# Patient Record
Sex: Female | Born: 1940 | Race: White | Hispanic: No | Marital: Married | Smoking: Never smoker
Health system: Southern US, Community
[De-identification: ages and names within clinical notes are randomized; demographics above are authoritative.]

## PROBLEM LIST (undated history)

## (undated) DIAGNOSIS — H9190 Unspecified hearing loss, unspecified ear: Secondary | ICD-10-CM

## (undated) DIAGNOSIS — K219 Gastro-esophageal reflux disease without esophagitis: Secondary | ICD-10-CM

## (undated) DIAGNOSIS — K811 Chronic cholecystitis: Secondary | ICD-10-CM

## (undated) DIAGNOSIS — R002 Palpitations: Secondary | ICD-10-CM

## (undated) DIAGNOSIS — M51369 Other intervertebral disc degeneration, lumbar region without mention of lumbar back pain or lower extremity pain: Secondary | ICD-10-CM

## (undated) DIAGNOSIS — M1990 Unspecified osteoarthritis, unspecified site: Secondary | ICD-10-CM

## (undated) DIAGNOSIS — N200 Calculus of kidney: Secondary | ICD-10-CM

## (undated) DIAGNOSIS — I10 Essential (primary) hypertension: Secondary | ICD-10-CM

## (undated) DIAGNOSIS — E785 Hyperlipidemia, unspecified: Secondary | ICD-10-CM

## (undated) DIAGNOSIS — N183 Chronic kidney disease, stage 3 unspecified: Secondary | ICD-10-CM

## (undated) DIAGNOSIS — Z87442 Personal history of urinary calculi: Secondary | ICD-10-CM

## (undated) DIAGNOSIS — I739 Peripheral vascular disease, unspecified: Secondary | ICD-10-CM

## (undated) DIAGNOSIS — R739 Hyperglycemia, unspecified: Secondary | ICD-10-CM

## (undated) DIAGNOSIS — R251 Tremor, unspecified: Secondary | ICD-10-CM

## (undated) DIAGNOSIS — M5136 Other intervertebral disc degeneration, lumbar region: Secondary | ICD-10-CM

## (undated) HISTORY — DX: Essential (primary) hypertension: I10

## (undated) HISTORY — DX: Calculus of kidney: N20.0

## (undated) HISTORY — PX: WISDOM TOOTH EXTRACTION: SHX21

## (undated) HISTORY — DX: Hyperlipidemia, unspecified: E78.5

## (undated) HISTORY — PX: EXTERNAL EAR SURGERY: SHX627

---

## 1961-10-17 HISTORY — PX: APPENDECTOMY: SHX54

## 1961-10-17 HISTORY — PX: OVARY SURGERY: SHX727

## 1968-10-17 HISTORY — PX: FOOT SURGERY: SHX648

## 2004-08-25 ENCOUNTER — Ambulatory Visit: Payer: Self-pay

## 2008-10-17 HISTORY — PX: COLONOSCOPY: SHX174

## 2009-05-05 ENCOUNTER — Ambulatory Visit: Payer: Self-pay | Admitting: Gastroenterology

## 2012-04-25 ENCOUNTER — Ambulatory Visit: Payer: Self-pay | Admitting: Internal Medicine

## 2013-05-02 ENCOUNTER — Ambulatory Visit: Payer: Self-pay | Admitting: Internal Medicine

## 2013-05-07 ENCOUNTER — Encounter: Payer: Self-pay | Admitting: *Deleted

## 2013-05-07 ENCOUNTER — Ambulatory Visit: Payer: Self-pay | Admitting: Internal Medicine

## 2013-05-22 ENCOUNTER — Ambulatory Visit (INDEPENDENT_AMBULATORY_CARE_PROVIDER_SITE_OTHER): Payer: Medicare Other | Admitting: General Surgery

## 2013-05-22 ENCOUNTER — Encounter: Payer: Self-pay | Admitting: General Surgery

## 2013-05-22 VITALS — BP 140/78 | HR 70 | Resp 14 | Ht 62.0 in | Wt 186.0 lb

## 2013-05-22 DIAGNOSIS — K811 Chronic cholecystitis: Secondary | ICD-10-CM | POA: Insufficient documentation

## 2013-05-22 NOTE — Progress Notes (Signed)
Patient ID: Dawn Vega, female   DOB: 1941-04-17, 72 y.o.   MRN: [id number]  Chief Complaint  Patient presents with  . Other    gallbladder    HPI Dawn Vega is a 72 y.o. female who presents for an evaluation of the gallbladder. The patient states off and on abdominal pain for approximately 8-9 months. She notices abdominal swelling when she eats spicy foods. The pain is located in the right upper quadrant which radiates to her back. The patient has not noted a clear correlation with fatty foods course reproducing her upper bowel discomfort.  She reports the pain is in the right upper quadrant with some referral into the right back. Bloating is also appreciated. No diarrhea.   HPI  Past Medical History  Diagnosis Date  . Hyperlipidemia   . Kidney stones 2014  . Hypertension     Past Surgical History  Procedure Laterality Date  . Foot surgery Left 1970  . External ear surgery Left     1970's  . Ovary surgery  1963  . Appendectomy  1963  . Colonoscopy  2010    Dr. Bluford Kaufmann    Family History  Problem Relation Age of Onset  . Cancer Paternal Aunt     breast   . Cancer Paternal Aunt     colon    Social History History  Substance Use Topics  . Smoking status: Never Smoker   . Smokeless tobacco: Never Used  . Alcohol Use: No    Allergies  Allergen Reactions  . Tape Rash    Current Outpatient Prescriptions  Medication Sig Dispense Refill  . aspirin 81 MG tablet Take 81 mg by mouth daily.      . cholecalciferol (VITAMIN D) 400 UNITS TABS tablet Take by mouth.      . fish oil-omega-3 fatty acids 1000 MG capsule Take 2 g by mouth daily.      Marland Kitchen losartan (COZAAR) 100 MG tablet Take 1 tablet by mouth daily.      . Multiple Vitamins-Minerals (MULTIVITAMIN WITH MINERALS) tablet Take 1 tablet by mouth daily.      Marland Kitchen omeprazole (PRILOSEC) 20 MG capsule Take 1 capsule by mouth daily at 6 (six) AM.      . pravastatin (PRAVACHOL) 40 MG tablet Take 1 tablet by mouth daily.      .  vitamin E 400 UNIT capsule Take 400 Units by mouth daily.       No current facility-administered medications for this visit.    Review of Systems Review of Systems  Constitutional: Negative.   Respiratory: Negative.   Cardiovascular: Negative.   Gastrointestinal: Positive for nausea and abdominal pain.    Blood pressure 140/78, pulse 70, resp. rate 14, height 5\' 2"  (1.575 m), weight 186 lb (84.369 kg).  Physical Exam Physical Exam  Constitutional: She is oriented to person, place, and time. She appears well-developed and well-nourished.  Neck: No thyromegaly present.  Cardiovascular: Normal rate, regular rhythm, normal heart sounds and normal pulses.   No murmur heard. Pulmonary/Chest: Effort normal and breath sounds normal.  Abdominal: Soft. Normal appearance and bowel sounds are normal. There is tenderness in the right upper quadrant.  Lymphadenopathy:    She has no cervical adenopathy.  Neurological: She is alert and oriented to person, place, and time.  Skin: Skin is warm and dry.    Data Reviewed Abdominal ultrasound dated 05/02/2013 was negative.  HIDA scan dated 05/02/2013 showed normal uptake, but a depressed ejection fraction of 16%  after receiving 8 ounces of Ensure orally. The patient experienced no reproduction of her pain ring the study.  Laboratory testing dated 04/17/2013 showed normal urinalysis, normal liver function studies, normal renal function  Assessment    Chronic cholecystitis based on HIDA scan, less than 100% correlation between symptoms and imaging studies to    Plan    Without reproduction of her pain during the study, the likelihood of relief of her abdominal distress is probably in the 50-75% range of elective cholecystectomy. The location of her symptoms is certainly suggestive of biliary tract dysfunction.  At this time, the patient has not committed to the idea of surgery.  She was encouraged to keep a dietary log to see if there was any  pattern developing between meals, activities and the onset/duration of her symptoms.  She was encouraged to call should she have any questions or desire to investigate elective cholecystectomy.       Dawn Vega 05/24/2013, 5:00 PM

## 2013-05-22 NOTE — Patient Instructions (Addendum)
Patient to return as needed. She is advised of warning signs related to the Gallbladder.

## 2013-05-24 ENCOUNTER — Encounter: Payer: Self-pay | Admitting: General Surgery

## 2013-10-30 ENCOUNTER — Observation Stay: Payer: Self-pay | Admitting: Internal Medicine

## 2013-10-30 LAB — COMPREHENSIVE METABOLIC PANEL
ALBUMIN: 4.4 g/dL (ref 3.4–5.0)
ALT: 36 U/L (ref 12–78)
Alkaline Phosphatase: 84 U/L
Anion Gap: 5 — ABNORMAL LOW (ref 7–16)
BILIRUBIN TOTAL: 0.5 mg/dL (ref 0.2–1.0)
BUN: 12 mg/dL (ref 7–18)
CALCIUM: 9.5 mg/dL (ref 8.5–10.1)
CHLORIDE: 104 mmol/L (ref 98–107)
CO2: 30 mmol/L (ref 21–32)
CREATININE: 0.85 mg/dL (ref 0.60–1.30)
EGFR (African American): >60
EGFR (Non-African Amer.): >60
Glucose: 142 mg/dL — ABNORMAL HIGH (ref 65–99)
OSMOLALITY: 280 (ref 275–301)
Potassium: 4.2 mmol/L (ref 3.5–5.1)
SGOT(AST): 30 U/L (ref 15–37)
Sodium: 139 mmol/L (ref 136–145)
Total Protein: 8.3 g/dL — ABNORMAL HIGH (ref 6.4–8.2)

## 2013-10-30 LAB — URINALYSIS, COMPLETE
BACTERIA: NONE SEEN
BILIRUBIN, UR: NEGATIVE
Blood: NEGATIVE
Glucose,UR: 50 mg/dL (ref 0–75)
KETONE: NEGATIVE
LEUKOCYTE ESTERASE: NEGATIVE
Nitrite: NEGATIVE
Ph: 8 (ref 4.5–8.0)
RBC,UR: <1 /HPF (ref 0–5)
Specific Gravity: 1.012 (ref 1.003–1.030)

## 2013-10-30 LAB — CBC
HCT: 44.8 % (ref 35.0–47.0)
HGB: 14.9 g/dL (ref 12.0–16.0)
MCH: 27.7 pg (ref 26.0–34.0)
MCHC: 33.2 g/dL (ref 32.0–36.0)
MCV: 84 fL (ref 80–100)
Platelet: 230 10*3/uL (ref 150–440)
RBC: 5.37 10*6/uL — ABNORMAL HIGH (ref 3.80–5.20)
RDW: 13.2 % (ref 11.5–14.5)
WBC: 8 10*3/uL (ref 3.6–11.0)

## 2013-10-30 LAB — LIPASE, BLOOD: LIPASE: 124 U/L (ref 73–393)

## 2013-10-31 LAB — BASIC METABOLIC PANEL
Anion Gap: 6 — ABNORMAL LOW (ref 7–16)
BUN: 12 mg/dL (ref 7–18)
CHLORIDE: 105 mmol/L (ref 98–107)
Calcium, Total: 9.4 mg/dL (ref 8.5–10.1)
Co2: 27 mmol/L (ref 21–32)
Creatinine: 0.86 mg/dL (ref 0.60–1.30)
EGFR (African American): >60
GLUCOSE: 148 mg/dL — AB (ref 65–99)
Osmolality: 278 (ref 275–301)
Potassium: 3.3 mmol/L — ABNORMAL LOW (ref 3.5–5.1)
SODIUM: 138 mmol/L (ref 136–145)

## 2013-10-31 LAB — CBC WITH DIFFERENTIAL/PLATELET
Basophil #: 0 10*3/uL (ref 0.0–0.1)
Basophil %: 0.1 %
Eosinophil #: 0 10*3/uL (ref 0.0–0.7)
Eosinophil %: 0.4 %
HCT: 40.4 % (ref 35.0–47.0)
HGB: 13.4 g/dL (ref 12.0–16.0)
LYMPHS PCT: 16.8 %
Lymphocyte #: 1.9 10*3/uL (ref 1.0–3.6)
MCH: 27.3 pg (ref 26.0–34.0)
MCHC: 33 g/dL (ref 32.0–36.0)
MCV: 83 fL (ref 80–100)
Monocyte #: 0.8 x10 3/mm (ref 0.2–0.9)
Monocyte %: 7 %
NEUTROS ABS: 8.7 10*3/uL — AB (ref 1.4–6.5)
Neutrophil %: 75.7 %
PLATELETS: 211 10*3/uL (ref 150–440)
RBC: 4.9 10*6/uL (ref 3.80–5.20)
RDW: 13.1 % (ref 11.5–14.5)
WBC: 11.5 10*3/uL — ABNORMAL HIGH (ref 3.6–11.0)

## 2013-10-31 LAB — MAGNESIUM: Magnesium: 1.4 mg/dL — ABNORMAL LOW

## 2013-11-20 ENCOUNTER — Ambulatory Visit: Payer: Self-pay | Admitting: Gastroenterology

## 2014-02-26 ENCOUNTER — Ambulatory Visit: Payer: Self-pay | Admitting: Surgery

## 2014-03-04 ENCOUNTER — Ambulatory Visit: Payer: Self-pay | Admitting: Surgery

## 2014-03-05 LAB — PATHOLOGY REPORT

## 2014-04-30 DIAGNOSIS — R251 Tremor, unspecified: Secondary | ICD-10-CM | POA: Insufficient documentation

## 2014-04-30 DIAGNOSIS — H9190 Unspecified hearing loss, unspecified ear: Secondary | ICD-10-CM | POA: Insufficient documentation

## 2014-05-27 ENCOUNTER — Ambulatory Visit: Payer: Self-pay | Admitting: Internal Medicine

## 2014-08-18 ENCOUNTER — Encounter: Payer: Self-pay | Admitting: General Surgery

## 2014-11-13 DIAGNOSIS — H40053 Ocular hypertension, bilateral: Secondary | ICD-10-CM | POA: Diagnosis not present

## 2014-11-14 DIAGNOSIS — H40053 Ocular hypertension, bilateral: Secondary | ICD-10-CM | POA: Diagnosis not present

## 2014-12-05 DIAGNOSIS — I10 Essential (primary) hypertension: Secondary | ICD-10-CM | POA: Diagnosis not present

## 2014-12-05 DIAGNOSIS — M150 Primary generalized (osteo)arthritis: Secondary | ICD-10-CM | POA: Diagnosis not present

## 2014-12-05 DIAGNOSIS — K219 Gastro-esophageal reflux disease without esophagitis: Secondary | ICD-10-CM | POA: Diagnosis not present

## 2014-12-05 DIAGNOSIS — E785 Hyperlipidemia, unspecified: Secondary | ICD-10-CM | POA: Diagnosis not present

## 2015-01-19 DIAGNOSIS — D2271 Melanocytic nevi of right lower limb, including hip: Secondary | ICD-10-CM | POA: Diagnosis not present

## 2015-01-19 DIAGNOSIS — D225 Melanocytic nevi of trunk: Secondary | ICD-10-CM | POA: Diagnosis not present

## 2015-01-19 DIAGNOSIS — L821 Other seborrheic keratosis: Secondary | ICD-10-CM | POA: Diagnosis not present

## 2015-01-19 DIAGNOSIS — D2261 Melanocytic nevi of right upper limb, including shoulder: Secondary | ICD-10-CM | POA: Diagnosis not present

## 2015-02-07 NOTE — Op Note (Signed)
PATIENT NAME:  Dawn Vega, Dawn Vega MR#:  [id number] DATE OF BIRTH:  1941/05/17  DATE OF PROCEDURE:  03/04/2014  PREOPERATIVE DIAGNOSIS: Chronic cholecystitis.   POSTOPERATIVE DIAGNOSIS: Chronic cholecystitis.   PROCEDURE: Laparoscopic cholecystectomy, cholangiogram.   SURGEON: Rochel Brome, MD   ANESTHESIA: General.   INDICATIONS: This 74 year old female has a history of chronic intermittent right upper quadrant abdominal pains. She has fatty food intolerance. Ultrasound demonstrated no gallstones; however, hepatobiliary scan showed an abnormally low gallbladder ejection fraction of 16%. Diagnosis of cholecystitis was made, and surgery was recommended for definitive treatment.   DESCRIPTION OF PROCEDURE: The patient was placed on the operating table in the supine position under general endotracheal anesthesia. The abdomen was prepared with ChloraPrep and draped in a sterile manner.   A short incision was made in the inferior aspect of the umbilicus and carried down to the deep fascia, which was grasped with laryngeal hook and elevated. A Veress needle was inserted, aspirated and irrigated with a saline solution. Next, the peritoneal cavity was inflated with carbon dioxide. The Veress needle was removed. The 10 mm cannula was inserted. The 10 mm, 0 degree laparoscope was inserted to view the peritoneal cavity. Another incision was made in the epigastrium slightly to the right of midline to introduce an 11 mm cannula. Two incisions were made in the lateral aspect of the right upper quadrant to introduce two 5 mm cannulas.   With the patient in the reverse Trendelenburg position and turned several degrees to the left, the gallbladder was retracted towards the right shoulder. There were adhesions surrounding the gallbladder. The liver itself appeared normal. The multiple adhesions were taken down with blunt and sharp dissection. Several small bleeding points were cauterized. The gallbladder was retracted  further towards the right shoulder, and the infundibulum was retracted inferiorly and laterally. The porta hepatis was demonstrated. Some fatty tissue was dissected away from the neck of the gallbladder, exposing the cystic duct, which was dissected free from surrounding structures. Also, the cystic artery was dissected free from surrounding structures. The gallbladder neck was mobilized with incision of the visceral peritoneum. A critical view of safety was demonstrated. An Endo Clip was placed across the cystic duct adjacent to the neck of the gallbladder. An incision was made in the cystic duct to introduce a Reddick catheter. Half-strength Conray-60 dye was injected as the cholangiogram was done with fluoroscopy, viewing the biliary tree and flow of dye into the duodenum. The cholangiogram appeared normal. Images were saved. Next, the laparoscope was reintroduced. The cystic duct was doubly ligated with Endo Clips and divided. The cystic artery was controlled with double Endo Clips and divided. The gallbladder was dissected free from the liver with hook and cautery. Bleeding was very minimal. Hemostasis was subsequently intact. The site was irrigated with a heparinized saline solution and aspirated. The gallbladder was delivered up through the infraumbilical incision, opened and suctioned, removed and submitted for routine pathology. The right upper quadrant was further inspected, seeing hemostasis was intact. The cannulas were removed. Carbon dioxide was allowed to escape from the peritoneal cavity. Skin incisions were closed with interrupted 5-0 chromic subcuticular sutures, benzoin and Steri-Strips. Dressings were applied with paper tape. The patient tolerated surgery satisfactorily and was then moved to the recovery room for postoperative care.   ____________________________ J. Rochel Brome, MD jws:lb D: 03/04/2014 08:47:22 ET T: 03/04/2014 08:57:33 ET JOB#: [id number]  cc: Loreli Dollar, MD,  <Dictator> Loreli Dollar MD ELECTRONICALLY SIGNED 03/10/2014 9:11

## 2015-02-07 NOTE — H&P (Signed)
PATIENT NAME:  Dawn Vega, DETTMANN MR#:  [id number] DATE OF BIRTH:  Oct 14, 1941  DATE OF ADMISSION:  10/30/2013  REFERRING PHYSICIAN: Dr. Jasmine December.   PRIMARY CARE PHYSICIAN: Dr. Ramonita Lab.   CHIEF COMPLAINT: Left flank pain.   A 74 year old Caucasian  female with past medical history of hyperlipidemia, gastroesophageal reflux disease, nephrolithiasis, presenting with left flank pain. She describes acute onset of sharp, left flank pain, nonradiating, 8 out of 10 in intensity. No worsening or relieving factors, associated with nausea and vomiting x 3 of nonbloody, nonbilious emesis. She has been intolerant of p.o. ever since; this has been a total of about 8 hours. She denies any fevers, chills  or recent sick contacts. In the Emergency Department, she received multiple doses of morphine, still has minimal symptoms, though markedly improved. She was going to be discharged; however, on ambulation, she felt her symptoms return and she felt dizzy.   ER staff felt that she required admission. Currently, she is without complaint other than feeling generalized weakness and fatigue.   REVIEW OF SYSTEMS:  CONSTITUTIONAL:  Positive for weakness, fatigue. Denies fevers or chills.  EYES: Denies blurred vision, double vision, eye pain.  EARS, NOSE, THROAT: Denies tinnitus, ear pain or hearing loss.  RESPIRATORY: Denies cough, wheezing, shortness of breath.  CARDIOVASCULAR: Denies chest pain, palpitations, edema.  GASTROINTESTINAL: Positive for nausea, vomiting and abdominal pain as described above. Denies any diarrhea or hematemesis or constipation.  GENITOURINARY: Denies dysuria or hematuria.  ENDOCRINE: Denies nocturia or thyroid problems.  HEMATOLOGIC AND LYMPHATIC: Denies easy bruising or bleeding.  SKIN: Denies rash or lesion.  MUSCULOSKELETAL: Denies pain in neck, back, shoulder, knees, hips, arthritic symptoms.  NEUROLOGIC: Denies paralysis, paresthesias.  PSYCHIATRIC:  Denies anxiety or depressive  symptoms. Otherwise, full review of systems performed by me is negative.   PAST MEDICAL HISTORY: Hyperlipidemia, hypertension, gastroesophageal reflux disease, as well as nephrolithiasis.   SOCIAL HISTORY: Denies any alcohol, tobacco or drug usage.   FAMILY HISTORY: Positive for lung cancer, though no known kidney problems. It does say that there is some irritable bowel, possibly Crohn disease that runs in the family, although she is uncertain of this.   ALLERGIES: No known drug allergies.   HOME MEDICATIONS: Include losartan 25 mg p.o. daily, Prilosec 20 mg p.o. daily, Percocet 5/325 mg p.o. every 6 hours as needed for pain, pravastatin 40 mg p.o. daily.   PHYSICAL EXAMINATION: VITAL SIGNS: Temperature 98.6, heart rate 92, respirations 18, blood pressure 168/68, saturating 95% on room air. Weight 81.6 kg, BMI 33.  GENERAL: Well-nourished, well-developed Caucasian female who is currently in no acute distress.  HEAD: Normocephalic, atraumatic.  EYES: Pupils equal, round and reactive to light. Extraocular muscles intact. No scleral icterus.  MOUTH: Dry mucosal membranes. Dentition intact. No abscess noted.   EARS, NOSE, THROAT:  Clear without exudates. No external lesions.  NECK: Supple. No thyromegaly. No nodules. No JVD.  PULMONARY: Clear to auscultation bilaterally. No wheezes or rhonchi. No use of accessory muscles. Good respiratory effort.  CHEST: Nontender to palpation.  CARDIOVASCULAR: S1, S2, regular rate and rhythm. No murmurs, rubs or gallops. No edema. Pedal pulses 2+ bilaterally.  GASTROINTESTINAL: Soft, nontender, nondistended. No masses. Positive bowel sounds. No hepatosplenomegaly. No CVA tenderness. No spinal or paraspinal tenderness.  NEUROLOGIC: Cranial nerves II through XII intact. No gross focal neurological deficits. Sensation and reflexes intact.  SKIN: No ulcerations, lesions, rash or cyanosis. Skin warm and dry. Turgor is intact.  PSYCHIATRIC:  Mood and affect are  within normal limits. The patient is awake, alert and oriented x 3. Insight and judgment intact.   LABORATORY DATA: Sodium 139, potassium 4.3, chloride 104, bicarb 30, BUN 12, creatinine 0.85, glucose 142. Total protein 8.3; remainder of LFTs within normal limits. WBC of 8, hemoglobin 14.9, platelets of 230. Urinalysis: Negative for evidence of infection, also negative for RBCs. Lactic acid of 1.3. CT abdomen performed revealing two 2 mm kidney stones on the right; however, no findings on the left, but there is a mild fold thickening in the terminal ileum; suspect a degree of terminal ileitis. Per the radiologist, major differential considerations include infectious or early Crohn's; however, there is no evidence of mesenteric thickening or fistulas. Remainder of bowel is within normal limits.   ASSESSMENT AND PLAN: A 74 year old Caucasian female with history of hyperlipidemia and gastroesophageal reflux disease as well as kidney stones, presented with:  1.  Left flank pain along with abdominal pain of unclear etiology. CT abdomen reveals terminal ileitis. There is some probable terminal ileitis. She has received antibiotics in the Emergency Department and Zofran. We will provide supportive care including pain control, Zofran, IV fluids. She has not eaten  recently. She has no risk factors for ischemic colitis. No change in bowel habits, including bright red blood per rectum; however, if her pain persists, worsens, or she develops further symptoms, we will move this higher in the differential, and she will need further workup.  2.  Hyperglycemia. We will add insulin sliding scale. She has no known diagnosis of diabetes. With q.6 hour Accu-Cheks.  3.  Gastroesophageal reflux disease, pantoprazole.  4.  Hypertension. Continue with losartan.  5.  Venous thromboembolism prophylaxis with sequential compression devices.   The patient is FULL CODE.   TIME SPENT: 45 minutes.     ____________________________ Aaron Mose. Kenny Rea, MD dkh:dmm D: 10/30/2013 21:45:35 ET T: 10/30/2013 22:08:54 ET JOB#: [id number]  cc: Aaron Mose. Candace Begue, MD, <Dictator> Hayzel Ruberg Woodfin Ganja MD ELECTRONICALLY SIGNED 10/31/2013 0:30

## 2015-03-17 DIAGNOSIS — J069 Acute upper respiratory infection, unspecified: Secondary | ICD-10-CM | POA: Diagnosis not present

## 2015-05-13 DIAGNOSIS — H40053 Ocular hypertension, bilateral: Secondary | ICD-10-CM | POA: Diagnosis not present

## 2015-07-17 ENCOUNTER — Other Ambulatory Visit: Payer: Self-pay | Admitting: Internal Medicine

## 2015-07-17 DIAGNOSIS — K219 Gastro-esophageal reflux disease without esophagitis: Secondary | ICD-10-CM | POA: Diagnosis not present

## 2015-07-17 DIAGNOSIS — E784 Other hyperlipidemia: Secondary | ICD-10-CM | POA: Diagnosis not present

## 2015-07-17 DIAGNOSIS — I10 Essential (primary) hypertension: Secondary | ICD-10-CM | POA: Diagnosis not present

## 2015-07-17 DIAGNOSIS — Z1231 Encounter for screening mammogram for malignant neoplasm of breast: Secondary | ICD-10-CM

## 2015-07-17 DIAGNOSIS — R251 Tremor, unspecified: Secondary | ICD-10-CM | POA: Diagnosis not present

## 2015-07-17 DIAGNOSIS — Z23 Encounter for immunization: Secondary | ICD-10-CM | POA: Diagnosis not present

## 2015-07-17 DIAGNOSIS — M150 Primary generalized (osteo)arthritis: Secondary | ICD-10-CM | POA: Diagnosis not present

## 2015-07-17 DIAGNOSIS — Z1239 Encounter for other screening for malignant neoplasm of breast: Secondary | ICD-10-CM | POA: Diagnosis not present

## 2015-07-17 DIAGNOSIS — E785 Hyperlipidemia, unspecified: Secondary | ICD-10-CM | POA: Diagnosis not present

## 2015-07-28 ENCOUNTER — Ambulatory Visit
Admission: RE | Admit: 2015-07-28 | Discharge: 2015-07-28 | Disposition: A | Discharge status: Home / Self Care | Payer: Commercial Managed Care - HMO | Source: Ambulatory Visit | Attending: Internal Medicine | Admitting: Internal Medicine

## 2015-07-28 DIAGNOSIS — Z1231 Encounter for screening mammogram for malignant neoplasm of breast: Secondary | ICD-10-CM | POA: Insufficient documentation

## 2015-09-15 DIAGNOSIS — E784 Other hyperlipidemia: Secondary | ICD-10-CM | POA: Diagnosis not present

## 2015-09-15 DIAGNOSIS — I10 Essential (primary) hypertension: Secondary | ICD-10-CM | POA: Diagnosis not present

## 2015-09-15 DIAGNOSIS — Z7982 Long term (current) use of aspirin: Secondary | ICD-10-CM | POA: Diagnosis not present

## 2015-09-15 DIAGNOSIS — K219 Gastro-esophageal reflux disease without esophagitis: Secondary | ICD-10-CM | POA: Diagnosis not present

## 2015-09-15 DIAGNOSIS — R270 Ataxia, unspecified: Secondary | ICD-10-CM | POA: Diagnosis not present

## 2015-09-15 DIAGNOSIS — M150 Primary generalized (osteo)arthritis: Secondary | ICD-10-CM | POA: Diagnosis not present

## 2015-09-15 DIAGNOSIS — R251 Tremor, unspecified: Secondary | ICD-10-CM | POA: Diagnosis not present

## 2015-10-13 DIAGNOSIS — S63501A Unspecified sprain of right wrist, initial encounter: Secondary | ICD-10-CM | POA: Diagnosis not present

## 2015-10-13 DIAGNOSIS — M25531 Pain in right wrist: Secondary | ICD-10-CM | POA: Diagnosis not present

## 2015-11-09 DIAGNOSIS — H40053 Ocular hypertension, bilateral: Secondary | ICD-10-CM | POA: Diagnosis not present

## 2015-11-17 DIAGNOSIS — H40053 Ocular hypertension, bilateral: Secondary | ICD-10-CM | POA: Diagnosis not present

## 2015-12-15 DIAGNOSIS — K219 Gastro-esophageal reflux disease without esophagitis: Secondary | ICD-10-CM | POA: Insufficient documentation

## 2015-12-15 DIAGNOSIS — I10 Essential (primary) hypertension: Secondary | ICD-10-CM | POA: Insufficient documentation

## 2015-12-15 DIAGNOSIS — M159 Polyosteoarthritis, unspecified: Secondary | ICD-10-CM | POA: Insufficient documentation

## 2015-12-15 DIAGNOSIS — R739 Hyperglycemia, unspecified: Secondary | ICD-10-CM | POA: Diagnosis not present

## 2015-12-15 DIAGNOSIS — M150 Primary generalized (osteo)arthritis: Secondary | ICD-10-CM | POA: Diagnosis not present

## 2015-12-15 DIAGNOSIS — R251 Tremor, unspecified: Secondary | ICD-10-CM | POA: Diagnosis not present

## 2015-12-15 DIAGNOSIS — E784 Other hyperlipidemia: Secondary | ICD-10-CM | POA: Diagnosis not present

## 2015-12-15 DIAGNOSIS — N3941 Urge incontinence: Secondary | ICD-10-CM | POA: Diagnosis not present

## 2015-12-15 DIAGNOSIS — Z7982 Long term (current) use of aspirin: Secondary | ICD-10-CM | POA: Diagnosis not present

## 2015-12-15 DIAGNOSIS — M79644 Pain in right finger(s): Secondary | ICD-10-CM | POA: Diagnosis not present

## 2016-01-05 DIAGNOSIS — N393 Stress incontinence (female) (male): Secondary | ICD-10-CM | POA: Diagnosis not present

## 2016-01-18 ENCOUNTER — Ambulatory Visit: Payer: Commercial Managed Care - HMO | Attending: Obstetrics and Gynecology | Admitting: Physical Therapy

## 2016-01-18 ENCOUNTER — Encounter: Payer: Self-pay | Admitting: Physical Therapy

## 2016-01-18 DIAGNOSIS — M629 Disorder of muscle, unspecified: Secondary | ICD-10-CM | POA: Diagnosis not present

## 2016-01-18 DIAGNOSIS — R279 Unspecified lack of coordination: Secondary | ICD-10-CM | POA: Insufficient documentation

## 2016-01-18 DIAGNOSIS — N8184 Pelvic muscle wasting: Secondary | ICD-10-CM | POA: Diagnosis not present

## 2016-01-18 DIAGNOSIS — M791 Myalgia: Secondary | ICD-10-CM | POA: Diagnosis not present

## 2016-01-18 DIAGNOSIS — L905 Scar conditions and fibrosis of skin: Secondary | ICD-10-CM | POA: Insufficient documentation

## 2016-01-18 DIAGNOSIS — M79604 Pain in right leg: Secondary | ICD-10-CM | POA: Diagnosis not present

## 2016-01-18 DIAGNOSIS — M6289 Other specified disorders of muscle: Secondary | ICD-10-CM

## 2016-01-18 DIAGNOSIS — M62838 Other muscle spasm: Secondary | ICD-10-CM | POA: Diagnosis not present

## 2016-01-18 NOTE — Patient Instructions (Signed)
You are now ready to begin training the deep core muscles system: diaphragm, transverse abdominis, pelvic floor . These muscles must work together as a team.           The key to these exercises to train the brain to coordinate the timing of these muscles and to have them turn on for long periods of time to hold you upright against gravity (especially important if you are on your feet all day).These muscles are postural muscles and play a role stabilizing your spine and bodyweight. By doing these repetitions slowly and correctly instead of doing crunches, you will achieve a flatter belly without a lower pooch. You are also placing your spine in a more neutral position and breathing properly which in turn, decreases your risk for problems related to your pelvic floor, abdominal, and low back such as pelvic organ prolapse, hernias, diastasis recti (separation of superficial muscles), disk herniations, spinal fractures. These exercises set a solid foundation for you to later progress to resistance/ strength training with therabands and weights and return to other typical fitness exercises with a stronger deeper core.  DO only LEVEL 1    CROSS OVER AND PIRIFORMIS (FIGURE 4 STRETCH )  5 breaths / 3 x day and pre/post walking

## 2016-01-19 NOTE — Therapy (Addendum)
[place] MAIN [facility] SERVICES [address] [place], [place], [address] Phone: [phone]   Fax:  [phone]  Physical Therapy Evaluation  Patient Details  Name: Tiani Benvenuti MRN: [id number] Date of Birth: April 21, 1941 Referring Provider: Willeen Cass  Encounter Date: 01/18/2016      PT End of Session - 01/18/16 1105    Visit Number 1   Number of Visits 12   Date for PT Re-Evaluation April 23, 2016   Authorization Type G-codes    PT Start Time    0810   PT End Time 0900   Activity Tolerance Patient tolerated treatment well;No increased pain   Behavior During Therapy [facility] for tasks assessed/performed      Past Medical History  Diagnosis Date  . Hyperlipidemia   . Kidney stones 2014  . Hypertension     Past Surgical History  Procedure Laterality Date  . Foot surgery Left 1970  . External ear surgery Left     1970's  . Ovary surgery  1963    due to cyst rupture  . Appendectomy  1963  . Colonoscopy  2010    Dr. Candace Cruise    There were no vitals filed for this visit.  Visit Diagnosis:  Pelvic floor dysfunction  Scar adherent  Fascial defect  Muscle spasm      Subjective Assessment - 01/18/16 1102    Subjective 1) SUI: Pt reports urinary leakage with sneezing, laughing, and running after granddaughter started 4 years ago. Sx has worsened to the point where she does not feel comfortable going without a pad. Changes 1 pad per day. Pt notices leakage with lifting 20 lb of water, Incomplete emptying with voiding in the last two three days. 2a )  low back mm spasm: with vaccuuming, raking, twisting,  4/10 discomfort, non radiating.  2b) R radiating LBP down lateral side to pinky toes occurs during sleep and in the monring and that wears off with movement. 2c) L > R knee pain that is constant. Arthritis related.Causes limping occasionally. 3/10 pain  3) L neck/ shoulder  pain after lifting WC for sister. 6-7/10 at its worst. Pt performs neck  stretches which is now under control.  Pt has avoided lifting.        Pertinent History 1 vaginal delivery with stitches, Practicing kegels sporadically. Cares for her sister who uses a WC.    Patient Stated Goals stop leakage and avoid wearing Depends    Currently in Pain? --   Pain Location --   Pain Orientation --   Pain Type --            [facility] PT Assessment - 01/18/16 1104    Assessment   Medical Diagnosis SUI   Referring Provider Bealsey   Precautions   Precautions None   Restrictions   Weight Bearing Restrictions No   Balance Screen   Has the patient fallen in the past 6 months No   Prior Function   Level of Independence Independent   Observation/Other Assessments   Other Surveys  --  PFDI 52%    Coordination   Gross Motor Movements are Fluid and Coordinated --  chest breathing , limited pelvic floor movement with cue    Posture/Postural Control   Posture Comments lumbopelvic instability w/ hip flexion   Palpation   Spinal mobility limited midback mobility   SI assessment  L PSIS tenderness w/ tensions at S3-4 level lateral aspect pf sacrum, L ASIS more anterior    Palpation  comment lower quadrant abdominal scar restrictions                  Pelvic Floor Special Questions - 01/18/16 1105    Pelvic Floor Internal Exam Pt verbally consented without contraindications   Exam Type Vaginal   Palpation increased mm tensions without tenderness to palpation (3 layers bilaterally)   Strength Flicker   Biofeedback cued for proper diaphragmatic breathing to elicit pelvic floor lengthening           [facility] Adult PT Treatment/Exercise - 01/25/16 1105    Posture/Postural Control   Posture Comments lumbopelvic instability w/ hip flexion   Self-Care   Self-Care --  POC, anatomy, physiology, goals    Neuro Re-ed    Neuro Re-ed Details  breathing coordination with pelvic floor lengthening    Manual Therapy   Manual therapy comments AP mob Grade II supine,  inferior glide of sacrum, to adjust PSIS, STM along lateral aspect of L sacru,                      PT Long Term Goals - 01/18/16 [id number]    PT LONG TERM GOAL #1   Title Pt will be able to increase her PSFS score for sleeping without interruption of radiating leg pain from 6/10 to  10/10 in order to improve sleep quality.    Time 1   Period Weeks   Status New   PT LONG TERM GOAL #2   Title Pt will increase her PSFS score with walking from 4/10 io > 8/10 in order to maintain health and wellness.    Time 12   Period Weeks   Status New   PT LONG TERM GOAL #3   Title Pt will be able to vaccuum and sweeping from 2/10 on PSFS t > 5/10 in order to perform ADLs.    Time 12   Period Weeks   Status New   PT LONG TERM GOAL #4   Title Pt will demo 3/5/5/5 pelvic floor strengthn in order to minimize SUI.    Time 12   Period Weeks   Status New   PT LONG TERM GOAL #5   Title Pt will report being able to go without a urinary pad for 3 days out of 7 days in order to save money and show improved urinary Sx.     Time 12   Period Weeks   Status New               Plan - 01/25/16 1108    Clinical Impression Statement P is a 75 yo female whose complaints include SUI, difficulty with complete voiding, R radiating low back pain, arthritis-related bilateral knee pain, and L neck/shoulder pain. Pt's clinicial presentations include pelvic floor weakness/coordination, pelvic girdle asymmetries, mm tightness along her spine, abdominal scar restrictions, and poor core strength which may be contributing factors to her pelvic floor dysfunction, LBP, LE, neck/shoulder complaints. These deficits limit her ability to care for sister, sleep without interruptions, and impact her QOL.    Rehab Potential Good   PT Frequency 1x / week   PT Duration 12 weeks   PT Treatment/Interventions ADLs/Self Care Home Management;Aquatic Therapy;Cryotherapy;Biofeedback;Electrical Stimulation;Moist  Heat;Traction;Balance training;Therapeutic activities;Therapeutic exercise;Functional mobility training;Stair training;Gait training;Neuromuscular re-education;Manual techniques;Manual lymph drainage;Taping;Splinting;Energy conservation;Dry needling;Passive range of motion;Scar mobilization;Other (comment)  body mechanics training   Consulted and Agree with Plan of Care Patient         Problem List Patient Active Problem  List   Diagnosis Date Noted  . Chronic cholecystitis 05/22/2013    [name] ,PT, DPT, E-RYT  01/18/2016, 11:20 AM  [place] MAIN [facility] SERVICES [address] [place], [place], [address] Phone: [phone]   Fax:  [phone]  Name: Krosby Bring MRN: [id number] Date of Birth: [birth year]

## 2016-01-25 ENCOUNTER — Emergency Department: Payer: Commercial Managed Care - HMO

## 2016-01-25 ENCOUNTER — Emergency Department
Admission: EM | Admit: 2016-01-25 | Discharge: 2016-01-25 | Disposition: A | Discharge status: Home / Self Care | Payer: Commercial Managed Care - HMO | Attending: Emergency Medicine | Admitting: Emergency Medicine

## 2016-01-25 ENCOUNTER — Ambulatory Visit: Payer: Commercial Managed Care - HMO | Admitting: Physical Therapy

## 2016-01-25 ENCOUNTER — Telehealth: Payer: Self-pay | Admitting: Physical Therapy

## 2016-01-25 DIAGNOSIS — M62838 Other muscle spasm: Secondary | ICD-10-CM

## 2016-01-25 DIAGNOSIS — Z79899 Other long term (current) drug therapy: Secondary | ICD-10-CM | POA: Insufficient documentation

## 2016-01-25 DIAGNOSIS — M79671 Pain in right foot: Secondary | ICD-10-CM | POA: Diagnosis not present

## 2016-01-25 DIAGNOSIS — I10 Essential (primary) hypertension: Secondary | ICD-10-CM | POA: Insufficient documentation

## 2016-01-25 DIAGNOSIS — M7989 Other specified soft tissue disorders: Secondary | ICD-10-CM

## 2016-01-25 DIAGNOSIS — M79661 Pain in right lower leg: Secondary | ICD-10-CM | POA: Diagnosis not present

## 2016-01-25 DIAGNOSIS — R2241 Localized swelling, mass and lump, right lower limb: Secondary | ICD-10-CM | POA: Diagnosis not present

## 2016-01-25 DIAGNOSIS — E785 Hyperlipidemia, unspecified: Secondary | ICD-10-CM | POA: Diagnosis not present

## 2016-01-25 DIAGNOSIS — Z7982 Long term (current) use of aspirin: Secondary | ICD-10-CM | POA: Insufficient documentation

## 2016-01-25 DIAGNOSIS — M79604 Pain in right leg: Secondary | ICD-10-CM

## 2016-01-25 DIAGNOSIS — L905 Scar conditions and fibrosis of skin: Secondary | ICD-10-CM

## 2016-01-25 DIAGNOSIS — M629 Disorder of muscle, unspecified: Secondary | ICD-10-CM

## 2016-01-25 DIAGNOSIS — M6289 Other specified disorders of muscle: Secondary | ICD-10-CM

## 2016-01-25 MED ORDER — IBUPROFEN 600 MG PO TABS: 600.0000 mg | ORAL_TABLET | Freq: Once | ORAL | Status: DC

## 2016-01-25 MED ORDER — IBUPROFEN 600 MG PO TABS
600.0000 mg | ORAL_TABLET | Freq: Once | ORAL | Status: AC
  Administered 2016-01-25: 600 mg via ORAL
  Filled 2016-01-25: qty 1

## 2016-01-25 NOTE — Telephone Encounter (Signed)
PT called Dr. Olin Pia office and informed RN re: pt's unrelenting R foot pain with weightbearing for the past 5 days, pt was brought to ED and d/c with neg results for DVT. PT asked if pt can be worked into their schedule and pt was scheduled for a 2:30pm appointment this afternoon.

## 2016-01-25 NOTE — Therapy (Signed)
[place] MAIN [facility] SERVICES [address] [place], [place], [address] Phone: [phone]   Fax:  [phone]  Patient Details  Name: Dawn Vega MRN: [id number] Date of Birth: 13-Feb-1941 Referring Provider:  Benjaman Kindler, MD  Encounter Date: 01/25/2016  Pt reported she had R dorsum/lateral foot pain that radiated up to her knee. This Sx started five days ago after being on her feet shopping and driving a 3 hr commute. Pain was 8/10 with weight-bearing. Accompanied with numbness and swelling. Swelling had gone down. Pain remained 8/10 today with weight -bearing upon arrival to PT Session.    PT assessment showed no weakness nor sensory deficits. PT deferred PT session today.  Pt was escorted in a WC to ED by PT and pt was taken back to the clinic upon arrival.       [name] ,PT, DPT, E-RYT  01/25/2016, 8:45 AM  [place], [place], [address] Phone: [phone]   Fax:  [phone]

## 2016-01-25 NOTE — Addendum Note (Signed)
Addended by: [name] on: 01/25/2016 11:23 AM   Modules accepted: Orders

## 2016-01-25 NOTE — ED Provider Notes (Signed)
[facility] Emergency Department Provider Note  ____________________________________________  Time seen: Approximately 9:17 AM  I have reviewed the triage vital signs and the nursing notes.   HISTORY  Chief Complaint Leg Pain and Foot Pain    HPI Dawn Vega is a 75 y.o. female without any personal or family history of blood clots presenting with right lower extremity swelling and pain. The patient reports that 6 days ago she was on her feet for large portion of the day, and then she noted that her right lower extremity was swollen and that she was having some lateral lower leg and right foot discomfort. Her swelling has significantly improved since then, and her pain is improved with Tylenol and low-dose Motrin. She has not been having any chest pain, shortness of breath, lightheadedness or syncope. No fever. No trauma.  Past Medical History  Diagnosis Date  . Hyperlipidemia   . Kidney stones 2014  . Hypertension     Patient Active Problem List   Diagnosis Date Noted  . Chronic cholecystitis 05/22/2013    Past Surgical History  Procedure Laterality Date  . Foot surgery Left 1970  . External ear surgery Left     1970's  . Ovary surgery  1963    due to cyst rupture  . Appendectomy  1963  . Colonoscopy  2010    Dr. Candace Cruise    Current Outpatient Rx  Name  Route  Sig  Dispense  Refill  . aspirin 81 MG tablet   Oral   Take 81 mg by mouth daily.         . cholecalciferol (VITAMIN D) 400 UNITS TABS tablet   Oral   Take by mouth.         . cromolyn (NASALCROM) 5.2 MG/ACT nasal spray   Each Nare   Place 1 spray into both nostrils 4 (four) times daily.         Marland Kitchen dextromethorphan-guaiFENesin (MUCINEX DM) 30-600 MG 12hr tablet   Oral   Take 1 tablet by mouth 2 (two) times daily.         . fish oil-omega-3 fatty acids 1000 MG capsule   Oral   Take 2 g by mouth daily.         Marland Kitchen ibuprofen (ADVIL,MOTRIN) 600 MG tablet   Oral   Take  1 tablet (600 mg total) by mouth once. With food   15 tablet   0   . losartan (COZAAR) 100 MG tablet   Oral   Take 1 tablet by mouth daily.         . metoprolol succinate (TOPROL-XL) 50 MG 24 hr tablet   Oral   Take 50 mg by mouth daily. Take with or immediately following a meal.         . Multiple Vitamins-Minerals (MULTIVITAMIN WITH MINERALS) tablet   Oral   Take 1 tablet by mouth daily.         Marland Kitchen omeprazole (PRILOSEC) 20 MG capsule   Oral   Take 1 capsule by mouth daily at 6 (six) AM.         . pravastatin (PRAVACHOL) 40 MG tablet   Oral   Take 1 tablet by mouth daily.         . vitamin E 400 UNIT capsule   Oral   Take 400 Units by mouth daily.           Allergies Tape  Family History  Problem Relation Age of Onset  .  Cancer Paternal Aunt     breast   . Breast cancer Paternal Aunt   . Cancer Paternal Aunt     colon    Social History Social History  Substance Use Topics  . Smoking status: Never Smoker   . Smokeless tobacco: Never Used  . Alcohol Use: No    Review of Systems Constitutional: No fever/chills.No lightheadedness or syncope. Eyes: No visual changes. ENT: No sore throat. No congestion or rhinorrhea. Cardiovascular: Denies chest pain. Denies palpitations. Respiratory: Denies shortness of breath.  No cough. Gastrointestinal: No abdominal pain.  No nausea, no vomiting.  No diarrhea.  No constipation. Genitourinary: Negative for dysuria. Musculoskeletal: Negative for back pain. Positive right large semi-swelling and pain. Skin: Negative for rash. Neurological: Negative for headaches. No focal numbness, tingling or weakness.   10-point ROS otherwise negative.  ____________________________________________   PHYSICAL EXAM:  VITAL SIGNS: ED Triage Vitals  Enc Vitals Group     BP 01/25/16 0833 194/105 mmHg     Pulse Rate 01/25/16 0833 70     Resp 01/25/16 0833 18     Temp 01/25/16 0833 97.6 F (36.4 C)     Temp Source  01/25/16 0833 Oral     SpO2 01/25/16 0833 98 %     Weight 01/25/16 0833 182 lb (82.555 kg)     Height 01/25/16 0833 5\' 2"  (1.575 m)     Head Cir --      Peak Flow --      Pain Score --      Pain Loc --      Pain Edu? --      Excl. in [place]? --     Constitutional: Alert and oriented. Well appearing and in no acute distress. Answers questions appropriately. Eyes: Conjunctivae are normal.  EOMI. No scleral icterus. Head: Atraumatic. Nose: No congestion/rhinnorhea. Mouth/Throat: Mucous membranes are moist.  Neck: No stridor.  Supple.  No JVD. Cardiovascular: Normal rate.  Respiratory: Normal respiratory effort.   Musculoskeletal: No obvious LE edema. No ttp in the calves or palpable cords.  Negative Homan's sign. I'm unable to reproduce the patient's discomfort in the right lateral lower leg and foot. Normal DP and PT pulses bilaterally. Cap refill less than 2 seconds in the feet bilaterally area did normal sensation to light touch throughout the bilateral lower extremities. Neurologic:  A&Ox3.  Speech is clear.  Face and smile are symmetric.  EOMI.  Moves all extremities well. Skin:  Skin is warm, dry and intact. No rash noted. Psychiatric: Mood and affect are normal. Speech and behavior are normal.  Normal judgement. ____________________________________________   LABS (all labs ordered are listed, but only abnormal results are displayed)  Labs Reviewed - No data to display ____________________________________________  EKG  Not indicated ____________________________________________  RADIOLOGY  US Venous Img Lower Unilateral Right  01/25/2016  CLINICAL DATA:  Right lower extremity swelling and pain for 5 days. EXAM: RIGHT LOWER EXTREMITY VENOUS DOPPLER ULTRASOUND TECHNIQUE: Gray-scale sonography with graded compression, as well as color Doppler and duplex ultrasound were performed to evaluate the lower extremity deep venous systems from the level of the common femoral vein and  including the common femoral, femoral, profunda femoral, popliteal and calf veins including the posterior tibial, peroneal and gastrocnemius veins when visible. The superficial great saphenous vein was also interrogated. Spectral Doppler was utilized to evaluate flow at rest and with distal augmentation maneuvers in the common femoral, femoral and popliteal veins. COMPARISON:  None. FINDINGS: Contralateral Common Femoral Vein:  Respiratory phasicity is normal and symmetric with the symptomatic side. No evidence of thrombus. Normal compressibility. Common Femoral Vein: No evidence of thrombus. Normal compressibility, respiratory phasicity and response to augmentation. Saphenofemoral Junction: No evidence of thrombus. Normal compressibility and flow on color Doppler imaging. Profunda Femoral Vein: No evidence of thrombus. Normal compressibility and flow on color Doppler imaging. Femoral Vein: No evidence of thrombus. Normal compressibility, respiratory phasicity and response to augmentation. Popliteal Vein: No evidence of thrombus. Normal compressibility, respiratory phasicity and response to augmentation. Calf Veins: No evidence of thrombus. Normal compressibility and flow on color Doppler imaging. Superficial Great Saphenous Vein: No evidence of thrombus. Normal compressibility and flow on color Doppler imaging. Venous Reflux:  None. Other Findings:  None. IMPRESSION: No evidence of deep venous thrombosis. Electronically Signed   By: Logan Bores M.D.   On: 01/25/2016 10:19    ____________________________________________   PROCEDURES  Procedure(s) performed: None  Critical Care performed: No ____________________________________________   INITIAL IMPRESSION / ASSESSMENT AND PLAN / ED COURSE  Pertinent labs & imaging results that were available during my care of the patient were reviewed by me and considered in my medical decision making (see chart for details).  75 y.o. female presenting with  improving right lower extremity swelling and pain. I will evaluate the patient for DVT. I would also consider venous insufficiency given her age. It is also possible that the patient had some minor trauma that she did not appreciate, but I do not think that she has any broken bones given that she is able to ambulate and bear full weight without pain.  ----------------------------------------- 10:27 AM on 01/25/2016 -----------------------------------------  The patient's ultrasound is negative for DVT. I'll plan to discharge her home with some medic treatment and close PMD follow-up. She understands return precautions as well as follow-up instructions.  ____________________________________________  FINAL CLINICAL IMPRESSION(S) / ED DIAGNOSES  Final diagnoses:  Pain and swelling of lower extremity, right      NEW MEDICATIONS STARTED DURING THIS VISIT:  New Prescriptions   IBUPROFEN (ADVIL,MOTRIN) 600 MG TABLET    Take 1 tablet (600 mg total) by mouth once. With food     Eula Listen, MD 01/25/16 1027

## 2016-01-25 NOTE — Discharge Instructions (Signed)
You may use Tylenol and Motrin for your pain. For swelling, please keep your leg elevated as much as possible. Please make a follow-up appointment with your primary care physician.  Return to the emergency department if you develop severe pain, chest pain, shortness of breath, lightheadedness or fainting, or any other symptoms concerning to you. Musculoskeletal Pain Musculoskeletal pain is muscle and boney aches and pains. These pains can occur in any part of the body. Your caregiver may treat you without knowing the cause of the pain. They may treat you if blood or urine tests, X-rays, and other tests were normal.  CAUSES There is often not a definite cause or reason for these pains. These pains may be caused by a type of germ (virus). The discomfort may also come from overuse. Overuse includes working out too hard when your body is not fit. Boney aches also come from weather changes. Bone is sensitive to atmospheric pressure changes. HOME CARE INSTRUCTIONS   Ask when your test results will be ready. Make sure you get your test results.  Only take over-the-counter or prescription medicines for pain, discomfort, or fever as directed by your caregiver. If you were given medications for your condition, do not drive, operate machinery or power tools, or sign legal documents for 24 hours. Do not drink alcohol. Do not take sleeping pills or other medications that may interfere with treatment.  Continue all activities unless the activities cause more pain. When the pain lessens, slowly resume normal activities. Gradually increase the intensity and duration of the activities or exercise.  During periods of severe pain, bed rest may be helpful. Lay or sit in any position that is comfortable.  Putting ice on the injured area.  Put ice in a bag.  Place a towel between your skin and the bag.  Leave the ice on for 15 to 20 minutes, 3 to 4 times a day.  Follow up with your caregiver for continued  problems and no reason can be found for the pain. If the pain becomes worse or does not go away, it may be necessary to repeat tests or do additional testing. Your caregiver may need to look further for a possible cause. SEEK IMMEDIATE MEDICAL CARE IF:  You have pain that is getting worse and is not relieved by medications.  You develop chest pain that is associated with shortness or breath, sweating, feeling sick to your stomach (nauseous), or throw up (vomit).  Your pain becomes localized to the abdomen.  You develop any new symptoms that seem different or that concern you. MAKE SURE YOU:   Understand these instructions.  Will watch your condition.  Will get help right away if you are not doing well or get worse.   This information is not intended to replace advice given to you by your health care provider. Make sure you discuss any questions you have with your health care provider.   Document Released: 10/03/2005 Document Revised: 12/26/2011 Document Reviewed: 06/07/2013 Elsevier Interactive Patient Education [employer].

## 2016-01-25 NOTE — ED Notes (Signed)
Pt sent here from PT with c/o right LE swelling and pain since last Wednesday.. Concerned for possible DVT

## 2016-01-27 DIAGNOSIS — M79671 Pain in right foot: Secondary | ICD-10-CM | POA: Diagnosis not present

## 2016-01-27 DIAGNOSIS — M7751 Other enthesopathy of right foot: Secondary | ICD-10-CM | POA: Diagnosis not present

## 2016-01-27 DIAGNOSIS — S93601A Unspecified sprain of right foot, initial encounter: Secondary | ICD-10-CM | POA: Diagnosis not present

## 2016-02-01 ENCOUNTER — Ambulatory Visit: Payer: Commercial Managed Care - HMO | Admitting: Physical Therapy

## 2016-02-01 DIAGNOSIS — R279 Unspecified lack of coordination: Secondary | ICD-10-CM

## 2016-02-01 DIAGNOSIS — M629 Disorder of muscle, unspecified: Secondary | ICD-10-CM | POA: Diagnosis not present

## 2016-02-01 DIAGNOSIS — M62838 Other muscle spasm: Secondary | ICD-10-CM | POA: Diagnosis not present

## 2016-02-01 DIAGNOSIS — M79604 Pain in right leg: Secondary | ICD-10-CM

## 2016-02-01 DIAGNOSIS — M791 Myalgia, unspecified site: Secondary | ICD-10-CM

## 2016-02-01 DIAGNOSIS — L905 Scar conditions and fibrosis of skin: Secondary | ICD-10-CM | POA: Diagnosis not present

## 2016-02-01 DIAGNOSIS — N8184 Pelvic muscle wasting: Secondary | ICD-10-CM | POA: Diagnosis not present

## 2016-02-02 NOTE — Patient Instructions (Addendum)
Deep core level 2 30 reps 2x day   Body mechanics training for household chores (handout)

## 2016-02-02 NOTE — Therapy (Signed)
[place] MAIN [facility] SERVICES [address] [place], [place], [address] Phone: [phone]   Fax:  [phone]  Physical Therapy Treatment  Patient Details  Name: Dawn Vega MRN: [id number] Date of Birth: 1940/11/29 Referring Provider: Willeen Cass  Encounter Date: 02/01/2016      PT End of Session - 02/02/16 2332    Visit Number 2   Number of Visits 12   Date for PT Re-Evaluation May 07, 2016   Authorization Type G-codes    PT Start Time 1410   PT Stop Time 1505   PT Time Calculation (min) 55 min   Activity Tolerance Patient tolerated treatment well;No increased pain   Behavior During Therapy [facility] for tasks assessed/performed      Past Medical History  Diagnosis Date  . Hyperlipidemia   . Kidney stones 2014  . Hypertension     Past Surgical History  Procedure Laterality Date  . Foot surgery Left 1970  . External ear surgery Left     1970's  . Ovary surgery  1963    due to cyst rupture  . Appendectomy  1963  . Colonoscopy  2010    Dr. Candace Cruise    There were no vitals filed for this visit.      Subjective Assessment - 02/02/16 2341    Subjective Pt has not had R radiating LBP for the past 2 weeks since the first visit. Pt reported her PCP diagnosed her R foot pain as capsulitis of the second toe and prescribed her a boot to decrease weight bearing on the forefoot.    Pertinent History 1 vaginal delivery with stitches, Practicing kegels sporadically. Cares for her sister who uses a WC.    Patient Stated Goals stop leakage and avoid wearing Depends             [facility] PT Assessment - 02/02/16 2329    Posture/Postural Control   Posture Comments improved lumbopelvic stability with Level 2                  Pelvic Floor Special Questions - 02/02/16 2328    Pelvic Floor Internal Exam Pt verbally consented without contraindications   Exam Type Vaginal   Palpation decreased mm tensions   Biofeedback improved pelvic  floor lengthening            [facility] Adult PT Treatment/Exercise - 02/02/16 2329    Therapeutic Activites    Therapeutic Activities --  body mechanics training sweeping, vaccuuming, cleaning   Neuro Re-ed    Neuro Re-ed Details  see pt instructions                PT Education - 02/02/16 2332    Education provided Yes   Education Details HEP   Person(s) Educated Patient   Methods Explanation;Demonstration;Tactile cues;Verbal cues;Handout   Comprehension Returned demonstration;Verbalized understanding             PT Long Term Goals - 02/02/16 2337    PT LONG TERM GOAL #1   Title Pt will be able to increase her PSFS score for sleeping without interruption of radiating leg pain from 6/10 to  10/10 in order to improve sleep quality.    Time 1   Period Weeks   Status Partially Met   PT LONG TERM GOAL #2   Title Pt will increase her PSFS score with walking from 4/10 io > 8/10 in order to maintain health and wellness.    Time 12   Period  Weeks   Status On-going   PT LONG TERM GOAL #3   Title Pt will be able to vaccuum and sweeping from 2/10 on PSFS t > 5/10 in order to perform ADLs.    Time 12   Period Weeks   Status Partially Met   PT LONG TERM GOAL #4   Title Pt will demo no lumbopelvic instability with 5 reps of level 1-4 and proper pelvic floor ROM  in order to decrease leakage with coughing, sneezing, laughing.   Time 12   Period Weeks   Status Revised   PT LONG TERM GOAL #5   Title Pt will report being able to go without a urinary pad for 3 days out of 7 days in order to save money and show improved urinary Sx.     Time 12   Period Weeks   Status On-going               Plan - 02/02/16 2333    Clinical Impression Statement Pt has not had any R radiating back pain since her 1 st visit. Pt has progressed with her deep core exercises with proper technique and showed no more pelvic floor mm tensions today. Pt also demo'd proper body mechanics with  household cleaning activities to minimize relapse of back pain.  Pt will continue to benefit from skilled PT to address her remaining goals. Pt has been wearing a boot due to foot injury that pt incurred 2 weeks ago. Once pt's weight-bearing restrictions are cleared by her PCP, plan to assess R foot pain and any potential gait deviations from wearing of boot to minimize malalignment of lower kinetic chain (foot to pelvis and spine) and to create additional goals related to this new problem. A new re-cert includes this additional Dx for Tx.    Rehab Potential Good   PT Frequency 1x / week   PT Duration 12 weeks   PT Treatment/Interventions ADLs/Self Care Home Management;Aquatic Therapy;Cryotherapy;Biofeedback;Electrical Stimulation;Moist Heat;Traction;Balance training;Therapeutic activities;Therapeutic exercise;Functional mobility training;Stair training;Gait training;Neuromuscular re-education;Manual techniques;Manual lymph drainage;Taping;Splinting;Energy conservation;Dry needling;Passive range of motion;Scar mobilization;Other (comment)  body mechanics training   Consulted and Agree with Plan of Care Patient      Patient will benefit from skilled therapeutic intervention in order to improve the following deficits and impairments:  Abnormal gait, Decreased endurance, Hypomobility, Decreased scar mobility, Decreased balance, Decreased activity tolerance, Increased fascial restricitons, Pain, Decreased range of motion, Decreased coordination, Decreased safety awareness, Impaired flexibility, Postural dysfunction, Improper body mechanics, Difficulty walking, Increased muscle spasms, Decreased mobility  Visit Diagnosis: Myalgia  Unspecified lack of coordination     Problem List Patient Active Problem List   Diagnosis Date Noted  . Chronic cholecystitis 05/22/2013    [name] ,PT, DPT, E-RYT  02/02/2016, 11:41 PM  [place] [facility]  SERVICES [address] [place], [place], [address] Phone: [phone]   Fax:  [phone]  Name: Dawn Vega MRN: [id number] Date of Birth: 06/25/1941

## 2016-02-10 DIAGNOSIS — M79671 Pain in right foot: Secondary | ICD-10-CM | POA: Diagnosis not present

## 2016-02-10 DIAGNOSIS — M659 Synovitis and tenosynovitis, unspecified: Secondary | ICD-10-CM | POA: Diagnosis not present

## 2016-02-12 ENCOUNTER — Encounter: Payer: Commercial Managed Care - HMO | Admitting: Physical Therapy

## 2016-02-16 ENCOUNTER — Encounter: Payer: Commercial Managed Care - HMO | Admitting: Physical Therapy

## 2016-02-22 ENCOUNTER — Ambulatory Visit: Payer: Commercial Managed Care - HMO | Attending: Obstetrics and Gynecology | Admitting: Physical Therapy

## 2016-02-22 DIAGNOSIS — M791 Myalgia, unspecified site: Secondary | ICD-10-CM

## 2016-02-22 DIAGNOSIS — M79604 Pain in right leg: Secondary | ICD-10-CM | POA: Insufficient documentation

## 2016-02-22 DIAGNOSIS — R279 Unspecified lack of coordination: Secondary | ICD-10-CM | POA: Diagnosis not present

## 2016-02-22 NOTE — Therapy (Signed)
[place] MAIN [facility] SERVICES [address] [place], [place], [address] Phone: [phone]   Fax:  [phone]  Physical Therapy Treatment  Patient Details  Name: Dawn Vega MRN: [id number] Date of Birth: 1941-03-15 Referring Provider: Willeen Cass  Encounter Date: 02/22/2016      PT End of Session - 02/22/16 December 16, 2217    Visit Number 3   Number of Visits 12   Date for PT Re-Evaluation 2016-04-30   Authorization Type G-codes    PT Start Time 0804   PT Stop Time 0900   PT Time Calculation (min) 56 min   Activity Tolerance Patient tolerated treatment well;No increased pain   Behavior During Therapy [facility] for tasks assessed/performed      Past Medical History  Diagnosis Date  . Hyperlipidemia   . Kidney stones 12-16-2012  . Hypertension     Past Surgical History  Procedure Laterality Date  . Foot surgery Left 1970  . External ear surgery Left     1970's  . Ovary surgery  1963    due to cyst rupture  . Appendectomy  16-Dec-1961  . Colonoscopy  16-Dec-2008    Dr. Candace Cruise    There were no vitals filed for this visit.      Subjective Assessment - 02/22/16 0807    Subjective Pt has returned after 3 weeks and continues to not have any LBP pain.  Pt states she experiences R foot pain 9/10  (top of R foot and leg) without  Ibuprofen (1200 mg) x 3/ day  for the past 2 weeks . She no longer wears a boot and will be seeing her podiatrist in 2 days.    Pertinent History 1 vaginal delivery with stitches, Practicing kegels sporadically. Cares for her sister who uses a WC.    Patient Stated Goals stop leakage and avoid wearing Depends             [facility] PT Assessment - 02/22/16 0835    Observation/Other Assessments   Observations posterior COM / weightbearing on heel    Coordination   Gross Motor Movements are Fluid and Coordinated --  rquired cues Level 2  for proper timing to decr. pelvic rock   Strength   Overall Strength Comments R eversion. inversion 4/5, L  5/5.  PF with UE support on wall, 3/5 R, 5/5 L                   Pelvic Floor Special Questions - 02/22/16 0834    Pelvic Floor Internal Exam Pt verbally consented without contraindications   Exam Type Vaginal   Palpation able to perform full pelvic floor excursion and life w/ cuig for optimal breathing coordination   Strength fair squeeze, definite lift           [facility] Adult PT Treatment/Exercise - 02/22/16 0903    Neuro Re-ed    Neuro Re-ed Details  gait training (see pt instructions) and posture alignment   deep core level 2 and 3 cuing to decrease lumbopelvic instab                PT Education - 02/22/16 Dec 16, 2217    Education provided Yes   Education Details HEP   Person(s) Educated Patient   Methods Explanation;Demonstration;Tactile cues;Verbal cues;Handout   Comprehension Returned demonstration;Verbalized understanding             PT Long Term Goals - 02/22/16 12/16/2222    PT LONG TERM GOAL #1  Title Pt will be able to increase her PSFS score for sleeping without interruption of radiating leg pain from 6/10 to  10/10 in order to improve sleep quality.    Time 1   Period Weeks   Status Achieved   PT LONG TERM GOAL #2   Title Pt will increase her PSFS score with walking from 4/10 io > 8/10 in order to maintain health and wellness. (5/8: prior to foot injury 6 weeks ago, 6/10 and currently, still limited by foot injury)     Time 12   Period Weeks   Status Partially Met   PT LONG TERM GOAL #3   Title Pt will be able to vaccuum and sweeping from 2/10 on PSFS t > 5/10 in order to perform ADLs. (5/8: 5/10)    Time 12   Period Weeks   Status Achieved   PT LONG TERM GOAL #4   Title Pt will demo no lumbopelvic instability with 5 reps of level 1-4 and proper pelvic floor ROM  in order to decrease leakage with coughing, sneezing, laughing.   Time 12   Period Weeks   Status Partially Met   PT LONG TERM GOAL #5   Title Pt will report being able to go without  a urinary pad for 3 days out of 7 days in order to save money and show improved urinary Sx.     Time 12   Period Weeks   Status Partially Met   PT LONG TERM GOAL #6   Title Pt will report 1/10 R foot pain with for > 30 min of standing    Time 12   Period Weeks   Status New   PT LONG TERM GOAL #7   Title Pt will report she is able manage R foot pain with exercises across 2 weeks  and relies less on Ibuprofen in order return to walking and standing.    Time 12   Period Weeks   Status New               Plan - 02/22/16 0850    Clinical Impression Statement Pt reported a decreased pain from 3/10 pain to 1.5/10 pain  in R foot pain after using gait training techniques and postureal alignment. Pt showed no more pelvic floor mm tensions. Pt was able to demonstrate pelvic floor movement in a coordinated way with other deep core mm and progressed with deep core mm exercises.  Pt was educated on letting her MD know about the high amounts of Ibuprofen she was taking (3657m/ day for the past 2 weeks) and pt voiced understanding and will be seeing her MD in two days to do so. Pt will continue to benefit from skilled PT.    Rehab Potential Good   PT Frequency 1x / week   PT Duration 12 weeks   PT Treatment/Interventions ADLs/Self Care Home Management;Aquatic Therapy;Cryotherapy;Biofeedback;Electrical Stimulation;Moist Heat;Traction;Balance training;Therapeutic activities;Therapeutic exercise;Functional mobility training;Stair training;Gait training;Neuromuscular re-education;Manual techniques;Manual lymph drainage;Taping;Splinting;Energy conservation;Dry needling;Passive range of motion;Scar mobilization;Other (comment)  body mechanics training   Consulted and Agree with Plan of Care Patient      Patient will benefit from skilled therapeutic intervention in order to improve the following deficits and impairments:  Abnormal gait, Decreased endurance, Hypomobility, Decreased scar mobility,  Decreased balance, Decreased activity tolerance, Increased fascial restricitons, Pain, Decreased range of motion, Decreased coordination, Decreased safety awareness, Impaired flexibility, Postural dysfunction, Improper body mechanics, Difficulty walking, Increased muscle spasms, Decreased mobility  Visit Diagnosis: Myalgia  Unspecified  lack of coordination  Pain In Right Leg     Problem List Patient Active Problem List   Diagnosis Date Noted  . Chronic cholecystitis 05/22/2013    [name] ,PT, DPT, E-RYT  02/22/2016, 10:25 PM  [place] MAIN [facility] SERVICES [address] [place], [place], [address] Phone: [phone]   Fax:  [phone]  Name: Dawn Vega MRN: [id number] Date of Birth: Jan 11, 1941

## 2016-02-22 NOTE — Patient Instructions (Signed)
Level 2 (knee out )  Level 3 ( lifting foot)  30 reps x in morning and night   Continue with stretches   Walking:  Keep center of mass (place behind belly button) over shin bones as you step forward ( to put more weight on fore/mid foot) less heel  Standing: unlock the knees and lift foot arch like Effiel Tower.  Sometimes alternate with semi tandem stance (foot in front of other , hip width apart)

## 2016-02-24 DIAGNOSIS — M659 Synovitis and tenosynovitis, unspecified: Secondary | ICD-10-CM | POA: Diagnosis not present

## 2016-02-29 ENCOUNTER — Encounter: Payer: Commercial Managed Care - HMO | Admitting: Physical Therapy

## 2016-03-07 ENCOUNTER — Ambulatory Visit: Payer: Commercial Managed Care - HMO | Admitting: Physical Therapy

## 2016-03-07 ENCOUNTER — Encounter: Payer: Commercial Managed Care - HMO | Admitting: Physical Therapy

## 2016-03-07 DIAGNOSIS — M791 Myalgia, unspecified site: Secondary | ICD-10-CM

## 2016-03-07 DIAGNOSIS — M79604 Pain in right leg: Secondary | ICD-10-CM | POA: Diagnosis not present

## 2016-03-07 DIAGNOSIS — R279 Unspecified lack of coordination: Secondary | ICD-10-CM | POA: Diagnosis not present

## 2016-03-07 NOTE — Therapy (Addendum)
[place] MAIN [facility] SERVICES [address] [place], [place], [address] Phone: [phone]   Fax:  [phone]  Physical Therapy Treatment / Discharge Summary   Patient Details  Name: Dawn Vega MRN: [id number] Date of Birth: 14-Dec-1940 Referring Provider: Willeen Cass  Encounter Date: 03/07/2016      PT End of Session - 03/07/16 1325    Visit Number 4   Number of Visits 12   Date for PT Re-Evaluation 2016-04-30   Authorization Type G-codes    PT Start Time 0810   PT Stop Time 0905   PT Time Calculation (min) 55 min   Activity Tolerance Patient tolerated treatment well;No increased pain   Behavior During Therapy [facility] for tasks assessed/performed      Past Medical History  Diagnosis Date  . Hyperlipidemia   . Kidney stones 2014  . Hypertension     Past Surgical History  Procedure Laterality Date  . Foot surgery Left 1970  . External ear surgery Left     1970's  . Ovary surgery  1963    due to cyst rupture  . Appendectomy  1963  . Colonoscopy  2010    Dr. Candace Cruise    There were no vitals filed for this visit.      Subjective Assessment - 03/07/16 0812    Subjective Pt returns after 2 weeks and continues to have no LBP. Pt 's R foot pain ihas improved but experiences 3/10 pain at the end fo the day. Pt has decreased her intake of Ibuprofen to every 3rd day. In terms of urinary leakage, pt had noticed leakage spots had decreased prior to her cold/cough. However,  she has noticed her urinary leakage worsened when she had her cold/ cough.      Pertinent History 1 vaginal delivery with stitches, Practicing kegels sporadically. Cares for her sister who uses a WC.    Patient Stated Goals stop leakage and avoid wearing Depends             [facility] PT Assessment - 03/07/16 1321    Strength   Overall Strength Comments heel raises 3/5 without ankle supination 10 reps B ,  higher high raises caused poor ankle instability   Ambulation/Gait    Gait Pattern --  heel striker,posterior COM,decreased trunk rotation/ hip ext                     [facility] Adult PT Treatment/Exercise - 03/07/16 1321    Therapeutic Activites    Therapeutic Activities --  gait training see pt instructions   Neuro Re-ed    Neuro Re-ed Details  see pt instructions                     PT Long Term Goals - 03/07/16 0816    PT LONG TERM GOAL #1   Title Pt will be able to increase her PSFS score for sleeping without interruption of radiating leg pain from 6/10 to  10/10 in order to improve sleep quality.    Time 1   Period Weeks   Status Achieved   PT LONG TERM GOAL #2   Title Pt will increase her PSFS score with walking from 4/10 io > 8/10 in order to maintain health and wellness. (5/8: prior to foot injury 6 weeks ago, 6/10 and currently, still limited by foot injury, 03/07/16: 7/10)     Time 12   Period Weeks   Status Partially Met  PT LONG TERM GOAL #3   Title Pt will be able to vaccuum and sweeping from 2/10 on PSFS t > 5/10 in order to perform ADLs. (5/8: 5/10)    Time 12   Period Weeks   Status Achieved   PT LONG TERM GOAL #4   Title Pt will demo no lumbopelvic instability with 5 reps of level 1-4 and proper pelvic floor ROM  in order to decrease leakage with coughing, sneezing, laughing.   Time 12   Period Weeks   Status Partially Met   PT LONG TERM GOAL #5   Title Pt will report being able to notice dry urinary pads for 3 days out of 7 days in order to save money and show improved urinary Sx.     Time 12   Period Weeks   Status Achieved   PT LONG TERM GOAL #6   Title Pt will report 1/10 R foot pain with for > 30 min of standing (03/07/16: 20 min)    Time 12   Period Weeks   Status Partially Met   PT LONG TERM GOAL #7   Title Pt will report she is able manage R foot pain with exercises across 2 weeks  and rely less on Ibuprofen in order return to walking and standing.    Time 12   Period Weeks   Status  On-going               Plan - 03/07/16 1327    Clinical Impression Statement Your pt has met 50% of her goals and partially met her remaining goals. Pt reported resolved LBP, signficantly less urinary leakage (prior to coming down with cough.cold) with dryer urinary pads for more than 3 days out of the week. Pt also noticed her stomach has become flatter with the exercises. Pt showed less mm tensions, increased deep core coordination/ strength, and pelvic floor coordination. Pt was educated on utilizing the deep core exercises to increase pelvic floor strength and not to perform regular Kegels due to pt's previous overactive pelvic floor mm and hip /back mm tightness. Pt was educated on coordinating quick contractions with emphasis on lengthening pelvic floor when coughing, sneezing, and laughing to minimize SUI. Pt was also educated on gait mechanics and feet strengthening exercises to continue decreasing her current level of feet pain (2/10). Pt demo'd techniques properly and voiced understanding. Pt is self-discharging from PT at this time due to increased expenses with medical bills and unable to continue with further PT. Pt was educated on how to advance her HEP to minimize relapse of her Sx and to progress towards wellness.  Thank you for your referral! Pt has been compliant and a pleasure to work with!      Rehab Potential Good   PT Frequency 1x / week   PT Duration 12 weeks   PT Treatment/Interventions ADLs/Self Care Home Management;Aquatic Therapy;Cryotherapy;Biofeedback;Electrical Stimulation;Moist Heat;Traction;Balance training;Therapeutic activities;Therapeutic exercise;Functional mobility training;Stair training;Gait training;Neuromuscular re-education;Manual techniques;Manual lymph drainage;Taping;Splinting;Energy conservation;Dry needling;Passive range of motion;Scar mobilization;Other (comment)  body mechanics training   Consulted and Agree with Plan of Care Patient      Patient  will benefit from skilled therapeutic intervention in order to improve the following deficits and impairments:  Abnormal gait, Decreased endurance, Hypomobility, Decreased scar mobility, Decreased balance, Decreased activity tolerance, Increased fascial restricitons, Pain, Decreased range of motion, Decreased coordination, Decreased safety awareness, Impaired flexibility, Postural dysfunction, Improper body mechanics, Difficulty walking, Increased muscle spasms, Decreased mobility  Visit Diagnosis: Myalgia  Unspecified  lack of coordination  Pain In Right Leg       G-Codes - 04/03/16 1326    Functional Assessment Tool Used PSFS clinical judgement and subjective   Functional Limitation Mobility: Walking and moving around;Self care   Mobility: Walking and Moving Around Current Status [phone]) At least 20 percent but less than 40 percent impaired, limited or restricted   Mobility: Walking and Moving Around Goal Status [phone]) At least 1 percent but less than 20 percent impaired, limited or restricted   Mobility: Walking and Moving Around Discharge Status [phone]) At least 1 percent but less than 20 percent impaired, limited or restricted   Self Care Current Status ([id number]) At least 40 percent but less than 60 percent impaired, limited or restricted   Self Care Goal Status ([id number]) At least 20 percent but less than 40 percent impaired, limited or restricted   Self Care Discharge Status [phone]) At least 20 percent but less than 40 percent impaired, limited or restricted      Problem List Patient Active Problem List   Diagnosis Date Noted  . Chronic cholecystitis 05/22/2013    [name] ,PT, DPT, E-RYT  04-03-16, 1:38 PM  [place] MAIN [facility] SERVICES [address] [place], [place], [address] Phone: [phone]   Fax:  [phone]  Name: Willadene Mounsey MRN: [id number] Date of Birth: Jun 07, 1941

## 2016-03-07 NOTE — Patient Instructions (Signed)
Heel raises 10 x  3 x day To build up your foot strength in arch  Picking up towel with feet/toes (while coffee is brewing or before putting on socks  To stretch feet    Walking :  Feet wider, swing arms for trunk rotation, and remember to breathe    ___________   Deep core exercises:  Progress with level 3 for 2 weeks, next 2 weeks, lift foot higher In a month, see if you are ready for level 4   __________   Pelvic floor coordination : in hale, lengthen pelvic floor, exhale lift pelvic floor and coordinate with cough  Every day: seated or laying down 10 x

## 2016-03-16 ENCOUNTER — Encounter: Payer: Commercial Managed Care - HMO | Admitting: Physical Therapy

## 2016-03-25 DIAGNOSIS — T63481A Toxic effect of venom of other arthropod, accidental (unintentional), initial encounter: Secondary | ICD-10-CM | POA: Diagnosis not present

## 2016-04-29 DIAGNOSIS — R21 Rash and other nonspecific skin eruption: Secondary | ICD-10-CM | POA: Diagnosis not present

## 2016-05-13 DIAGNOSIS — H2513 Age-related nuclear cataract, bilateral: Secondary | ICD-10-CM | POA: Diagnosis not present

## 2016-07-18 DIAGNOSIS — M179 Osteoarthritis of knee, unspecified: Secondary | ICD-10-CM | POA: Diagnosis not present

## 2016-07-18 DIAGNOSIS — Z23 Encounter for immunization: Secondary | ICD-10-CM | POA: Diagnosis not present

## 2016-07-18 DIAGNOSIS — K219 Gastro-esophageal reflux disease without esophagitis: Secondary | ICD-10-CM | POA: Diagnosis not present

## 2016-07-18 DIAGNOSIS — R739 Hyperglycemia, unspecified: Secondary | ICD-10-CM | POA: Diagnosis not present

## 2016-07-18 DIAGNOSIS — E784 Other hyperlipidemia: Secondary | ICD-10-CM | POA: Diagnosis not present

## 2016-07-18 DIAGNOSIS — M25561 Pain in right knee: Secondary | ICD-10-CM | POA: Diagnosis not present

## 2016-07-18 DIAGNOSIS — M150 Primary generalized (osteo)arthritis: Secondary | ICD-10-CM | POA: Diagnosis not present

## 2016-07-18 DIAGNOSIS — G8929 Other chronic pain: Secondary | ICD-10-CM | POA: Diagnosis not present

## 2016-07-18 DIAGNOSIS — I10 Essential (primary) hypertension: Secondary | ICD-10-CM | POA: Diagnosis not present

## 2016-07-18 DIAGNOSIS — M25562 Pain in left knee: Secondary | ICD-10-CM | POA: Diagnosis not present

## 2016-07-18 DIAGNOSIS — Z0000 Encounter for general adult medical examination without abnormal findings: Secondary | ICD-10-CM | POA: Diagnosis not present

## 2016-07-25 DIAGNOSIS — L298 Other pruritus: Secondary | ICD-10-CM | POA: Diagnosis not present

## 2016-07-25 DIAGNOSIS — D225 Melanocytic nevi of trunk: Secondary | ICD-10-CM | POA: Diagnosis not present

## 2016-07-25 DIAGNOSIS — L538 Other specified erythematous conditions: Secondary | ICD-10-CM | POA: Diagnosis not present

## 2016-07-25 DIAGNOSIS — D485 Neoplasm of uncertain behavior of skin: Secondary | ICD-10-CM | POA: Diagnosis not present

## 2016-07-25 DIAGNOSIS — L820 Inflamed seborrheic keratosis: Secondary | ICD-10-CM | POA: Diagnosis not present

## 2016-07-28 DIAGNOSIS — M5416 Radiculopathy, lumbar region: Secondary | ICD-10-CM | POA: Diagnosis not present

## 2016-07-28 DIAGNOSIS — M1711 Unilateral primary osteoarthritis, right knee: Secondary | ICD-10-CM | POA: Diagnosis not present

## 2016-07-28 DIAGNOSIS — M47816 Spondylosis without myelopathy or radiculopathy, lumbar region: Secondary | ICD-10-CM | POA: Diagnosis not present

## 2016-07-28 DIAGNOSIS — M5136 Other intervertebral disc degeneration, lumbar region: Secondary | ICD-10-CM | POA: Diagnosis not present

## 2016-07-29 ENCOUNTER — Other Ambulatory Visit: Payer: Self-pay | Admitting: Internal Medicine

## 2016-07-29 DIAGNOSIS — Z1231 Encounter for screening mammogram for malignant neoplasm of breast: Secondary | ICD-10-CM

## 2016-08-24 ENCOUNTER — Ambulatory Visit: Payer: Commercial Managed Care - HMO | Attending: Orthopedic Surgery | Admitting: Physical Therapy

## 2016-08-24 ENCOUNTER — Encounter: Payer: Self-pay | Admitting: Physical Therapy

## 2016-08-24 DIAGNOSIS — M79671 Pain in right foot: Secondary | ICD-10-CM | POA: Diagnosis not present

## 2016-08-24 DIAGNOSIS — M79661 Pain in right lower leg: Secondary | ICD-10-CM | POA: Diagnosis not present

## 2016-08-24 NOTE — Therapy (Signed)
[facility] [address], [place], [address] Phone: [phone]   Fax:  [phone]  Physical Therapy Evaluation  Patient Details  Name: Dawn Vega MRN: [id number] Date of Birth: 1941/02/27 Referring Provider: Feliberto Gottron, [place]  Encounter Date: 08/24/2016      PT End of Session - 08/24/16 1531    Visit Number 1   Number of Visits 13   Date for PT Re-Evaluation 09/21/16   Authorization Type 1/10   PT Start Time 1345   PT Stop Time 1500   PT Time Calculation (min) 75 min   Activity Tolerance Patient tolerated treatment well   Behavior During Therapy [facility] for tasks assessed/performed      Past Medical History:  Diagnosis Date  . Hyperlipidemia   . Hypertension   . Kidney stones 2014    Past Surgical History:  Procedure Laterality Date  . APPENDECTOMY  1963  . COLONOSCOPY  2010   Dr. Candace Cruise  . EXTERNAL EAR SURGERY Left    1970's  . FOOT SURGERY Left 1970  . [place]   due to cyst rupture    There were no vitals filed for this visit.       Subjective Assessment - 08/24/16 1521    Subjective R foot and lateral leg pain   Pertinent History Pt reports that the top of her R foot and up to her R knee is painful which started back in April 2017 with insidious onset. She states she had switched into wearing sandals (after wearing more supportive closed toe shoes) and had done a lot of walking in [place]; she reports later that night she had pain across the top of her foot. She initially attributed her pain to the change in shoes, but her pain has not changed since. She was put in a walking boot for 5 weeks but she states it did not really help. She has not been in the boot since June 2017. She states that if she walks with trunk flexion (like over a shopping cart) she does not have foot pain. She also states that if she leans back her foot pain goes away. She reports her foot  pain is worse at the day goes on. She has been doing some exercises (demonstrated DKTC and FABER stretch) that she found on the internet and she reports that she thinks it has helped some. She denies LBP currently but reports being stiff in the mornings. She does report injuring her low back about 20 years ago but denies any LBP in the last 10 years. She has never had this foot pain before. She reports a deep ache in the top of her foot and a deep burn along the lateral part of her lower leg. She reports intermittent issues with walking but reports standing still "kills me." She denies issues with stairs and curbs. She reports intermittent tingling, but can't recall what she was doing or when it occurs. She reports getting in the car especially hurts her leg. She reports after long bouts of driving her RLE feels more tired. Prior to performing her exercises, the pain would wake her up 3-4x/night but since starting her own exercises, she doesn't have issues with sleeping. She states the pain gets up to 7/10 at worse, 0/10 at best. It is currently 2/10. This pain keeps her from being more active and she enjoys shopping and reading.   Limitations Standing;Walking   How long  can you stand comfortably? pain occurs almost immediately; will eventually have to sit after about 15-20 mins   How long can you walk comfortably? intermittent issues with walking   Patient Stated Goals be able to shop and walk around without foot pain   Currently in Pain? Yes   Pain Score 2    Pain Location Foot   Pain Orientation Right   Pain Descriptors / Indicators Aching;Burning   Pain Type Chronic pain   Pain Onset More than a month ago   Aggravating Factors  a lot of activity; standing   Pain Relieving Factors rest            [facility] PT Assessment - 08/24/16 0001      Assessment   Medical Diagnosis Right lumbar radiculopathy; Right leg pain; L5 radiculopathy; stenosis   Referring Provider Feliberto Gottron, PA-C    Onset Date/Surgical Date 01/23/16   Next MD Visit n/a   Prior Therapy not for present issue     Precautions   Precautions None     Restrictions   Weight Bearing Restrictions No     Balance Screen   Has the patient fallen in the past 6 months No   Has the patient had a decrease in activity level because of a fear of falling?  No   Is the patient reluctant to leave their home because of a fear of falling?  No     Home Ec[employer] residence   Living Arrangements Spouse/significant other   Available Help at Discharge Family   Type of [place] One level   [place] None     Prior Function   Level of [place] Retired   Leisure shopping, reading, watch TV, attend church     Cognition   Overall Cognitive Status Within Functional Limits for tasks assessed       SENSATION  L5: increased LT on R  AROM  Left Right   Ankle DF +6 deg +3 deg  Ankle PF 22 22  Inversion 32 25  Eversion 18 11   All other BLE AROM WNL, non-painful  R inversion PROM painful to lateral ankle; active inversion and active eversion non-painful  STRENGTH (on scale of 0-5/5) 5/5 t/o BLE   PALPATION TTP along peroneals, anterior tib, and especially posterior tib; pt reported these palpations as recreating her pain TTP behind lateral malleolus (peroneal tendons)  GAIT/STANCE Increased pronation, R > L, in stance and more so in gait Increased hallux valgus, R>L, in stance and more so in gait Decreased DF, increased toe out, R>L, throughout gait  While assessing pt's stance, pt had R foot pain approximately 1 minute after static standing   BALANCE Increased instability on R>L with HHA; after about 30 sec of SLS on RLE, pt reported pain on top of foot started   FUNCTIONAL MOVEMENTS Eccentric step down controlled with BUE support on railings; pt had increased pain with R foot  SPECIAL TESTS Slump: negative bil  SLR:  neg bil     MANUAL Cross friction and efflurage to R peroneals, anterior tib, and posterior tib x 12 mins total; pt reported improved symptoms following and manual, pt able to stand 2:30 sec before burning sensation on top of foot started again        PT Education - 08/24/16 1531    Education provided Yes   Education Details exam findings, POC, wear orthotics between now  and next session and assess symptoms   Person(s) Educated Patient   Methods Explanation   Comprehension Verbalized understanding             PT Long Term Goals - 08/24/16 1547      PT LONG TERM GOAL #1   Title Pt will have improved LEFS score by >9 points to demo decreased pain and improved overall function.   Baseline 11/8: 35/80   Time 6   Period Weeks   Status New     PT LONG TERM GOAL #2   Title Pt will be able to stand for >1 hour with 2/10 pain or less to maximize participation in the community.    Baseline 11/8: pt has pain almost immediately and has to sit in order for R foot/lower leg to feel better   Time 6   Period Weeks   Status New     PT LONG TERM GOAL #3   Title Pt will be able to walk for >1 hour with 2/10 pain or less to maximize overall function in the community.   Baseline 11/8: intermittently has pain with walking; when she does, she has to hunch over in order to make it feel better (hunch over grocery cart).   Time 6   Period Weeks   Status New               Plan - 08/24/16 1531    Clinical Impression Statement Pt is pleasant 75 YO F who presents to therapy with c/o R foot and R lateral leg pain since April 2017. Pt has s/s consistent with R peroneal and posterior tib tendinitis as demo by increased pronation in stance and in gait, increased hallux valgus, increased toe out with gait, and increased TTP of peroneals and posterior tib which recreated her pain. Pt also had increased TTP of R anterior tib and upon prolonged soft tissue mobilization of posteiror tib, her  anterior R foot pain was recreated. Pt has deficits in ankle AROM, gait, and single leg stance on RLE throughout. Pt main limiting factor is pain with static standing. She reports having orthotics that she used to wear for her plantar fascitis but she has not worn them since that improved. SPT educated pt to attempt wearing the orthotics again for longitudinal arch support to see how her symptoms respond. Pt negative for slump test and CPAs to lumbar spine were point-tender, but did not recreate pt's pain. Pt needs skilled PT intervention to maximize overall function and decrease pain.   Rehab Potential Good   Clinical Impairments Affecting Rehab Potential motivated, first time issue with R foot pain, previous bout of plantar fascitis which improved with orthotics   PT Frequency 2x / week   PT Duration 6 weeks   PT Treatment/Interventions Cryotherapy;Electrical Stimulation;Gait training;Stair training;Functional mobility training;Therapeutic activities;Therapeutic exercise;Balance training;Neuromuscular re-education;Patient/family education;Orthotic Fit/Training;Manual techniques;Passive range of motion;Compression bandaging;Dry needling;Taping   PT Next Visit Plan soft tissue to peroneals, posterior tib, anterior tib, gastroc-soleus; assess calf tightness; check orthotics/shoe wear that pt brings in    Consulted and Agree with Plan of Care Patient      Patient will benefit from skilled therapeutic intervention in order to improve the following deficits and impairments:  Abnormal gait, Decreased activity tolerance, Decreased balance, Decreased range of motion, Increased muscle spasms, Pain  Visit Diagnosis: Pain in right foot  Pain in right lower leg     Problem List Patient Active Problem List   Diagnosis Date Noted  .  Chronic cholecystitis 05/22/2013   Geraldine Solar, SPT  Geraldine Solar 08/24/2016, 3:51 PM  [facility] [address] [place], [address] Phone: [phone]   Fax:  [phone]  Name: Icy Champigny MRN: [id number] Date of Birth: 1941-04-01

## 2016-08-26 ENCOUNTER — Ambulatory Visit: Payer: Commercial Managed Care - HMO | Attending: Internal Medicine

## 2016-08-30 ENCOUNTER — Encounter: Payer: Self-pay | Admitting: Physical Therapy

## 2016-08-30 ENCOUNTER — Ambulatory Visit: Payer: Commercial Managed Care - HMO | Admitting: Physical Therapy

## 2016-08-30 DIAGNOSIS — M79671 Pain in right foot: Secondary | ICD-10-CM | POA: Diagnosis not present

## 2016-08-30 DIAGNOSIS — M79661 Pain in right lower leg: Secondary | ICD-10-CM | POA: Diagnosis not present

## 2016-08-30 NOTE — Therapy (Signed)
[facility] [address], [place], [address] Phone: [phone]   Fax:  [phone]  Physical Therapy Treatment  Patient Details  Name: Dawn Vega MRN: [id number] Date of Birth: 1940/10/22 Referring Provider: Feliberto Gottron, [place]  Encounter Date: 08/30/2016      PT End of Session - 08/30/16 0934    Visit Number 2   Number of Visits 13   Date for PT Re-Evaluation 09/21/16   Authorization Type 2/10   PT Start Time 0930   PT Stop Time 1015   PT Time Calculation (min) 45 min   Activity Tolerance Patient tolerated treatment well   Behavior During Therapy [facility] for tasks assessed/performed      Past Medical History:  Diagnosis Date  . Hyperlipidemia   . Hypertension   . Kidney stones 2014    Past Surgical History:  Procedure Laterality Date  . APPENDECTOMY  1963  . COLONOSCOPY  2010   Dr. Candace Cruise  . EXTERNAL EAR SURGERY Left    1970's  . FOOT SURGERY Left 1970  . [place]   due to cyst rupture    There were no vitals filed for this visit.      Subjective Assessment - 08/30/16 0933    Subjective Pt states that she was sore following manual therapy last sessoin. She states that she feels it might be a little better.    Pertinent History Pt reports that the top of her R foot and up to her R knee is painful which started back in April 2017 with insidious onset. She states she had switched into wearing sandals (after wearing more supportive closed toe shoes) and had done a lot of walking in [place]; she reports later that night she had pain across the top of her foot. She initially attributed her pain to the change in shoes, but her pain has not changed since. She was put in a walking boot for 5 weeks but she states it did not really help. She has not been in the boot since June 2017. She states that if she walks with trunk flexion (like over a shopping cart) she does not have foot  pain. She also states that if she leans back her foot pain goes away. She reports her foot pain is worse at the day goes on. She has been doing some exercises (demonstrated DKTC and FABER stretch) that she found on the internet and she reports that she thinks it has helped some. She denies LBP currently but reports being stiff in the mornings. She does report injuring her low back about 20 years ago but denies any LBP in the last 10 years. She has never had this foot pain before. She reports a deep ache in the top of her foot and a deep burn along the lateral part of her lower leg. She reports intermittent issues with walking but reports standing still "kills me." She denies issues with stairs and curbs. She reports intermittent tingling, but can't recall what she was doing or when it occurs. She reports getting in the car especially hurts her leg. She reports after long bouts of driving her RLE feels more tired. Prior to performing her exercises, the pain would wake her up 3-4x/night but since starting her own exercises, she doesn't have issues with sleeping. She states the pain gets up to 7/10 at worse, 0/10 at best. It is currently 2/10. This pain keeps her from being  more active and she enjoys shopping and reading.   Limitations Standing;Walking   How long can you stand comfortably? pain occurs almost immediately; will eventually have to sit after about 15-20 mins   How long can you walk comfortably? intermittent issues with walking   Patient Stated Goals be able to shop and walk around without foot pain   Currently in Pain? Yes   Pain Score 2    Pain Location Ankle   Pain Orientation Right   Pain Descriptors / Indicators Tightness   Pain Onset More than a month ago     manual: Pt brought in multiple shoes and her orthotics that she has bought over the last year; SPT and PT assessed shoes and told pt which ones appeared to be the most and least supportive and educated pt to wear the more supportive  shoes with her orthotics at this point to help provide stability to her ankle and help decrease pain  Cross friction and efflurage to R peroneals and posterior tib x 30 mins; pt had increased soft tissue restrictions and TTP throughout, posterior tib > peroneals; pt had palpable trigger points in posterior tib which pt reported intermittently feeling referred pain in her great toe  Assessed walking in gym following manual; pt reported "feeling great" following and denied any pain with walking  bil calf stretch on step, 3x30 sec; pt reported feeling in her calves appropriately and added to HEP      PT Education - 08/30/16 1018    Education provided Yes   Education Details add in bil calf stretching on step to HEP, will be sore from manual therapy   Person(s) Educated Patient   Methods Explanation   Comprehension Verbalized understanding             PT Long Term Goals - 08/24/16 1547      PT LONG TERM GOAL #1   Title Pt will have improved LEFS score by >9 points to demo decreased pain and improved overall function.   Baseline 11/8: 35/80   Time 6   Period Weeks   Status New     PT LONG TERM GOAL #2   Title Pt will be able to stand for >1 hour with 2/10 pain or less to maximize participation in the community.    Baseline 11/8: pt has pain almost immediately and has to sit in order for R foot/lower leg to feel better   Time 6   Period Weeks   Status New     PT LONG TERM GOAL #3   Title Pt will be able to walk for >1 hour with 2/10 pain or less to maximize overall function in the community.   Baseline 11/8: intermittently has pain with walking; when she does, she has to hunch over in order to make it feel better (hunch over grocery cart).   Time 6   Period Weeks   Status New               Plan - 08/30/16 1020    Clinical Impression Statement Pt presents to therapy with reports of slightly improved symptoms with walking from last treatment session. Pt continues with  increased soft tissue restrictions and TTP of R peroneals and posterior tib, posterior tib > peroneals. Pt reported "feeling great" following manual therapy and reported no pain with walking. Pt given calf stretch to help facilitate improved DF and pt's strength will be addressed next session.    Rehab Potential Good  Clinical Impairments Affecting Rehab Potential motivated, first time issue with R foot pain, previous bout of plantar fascitis which improved with orthotics   PT Frequency 2x / week   PT Duration 6 weeks   PT Treatment/Interventions Cryotherapy;Electrical Stimulation;Gait training;Stair training;Functional mobility training;Therapeutic activities;Therapeutic exercise;Balance training;Neuromuscular re-education;Patient/family education;Orthotic Fit/Training;Manual techniques;Passive range of motion;Compression bandaging;Dry needling;Taping   PT Next Visit Plan soft tissue to peroneals, posterior tib, anterior tib, gastroc-soleus; assess calf tightness; check orthotics/shoe wear that pt brings in    Consulted and Agree with Plan of Care Patient      Patient will benefit from skilled therapeutic intervention in order to improve the following deficits and impairments:  Abnormal gait, Decreased activity tolerance, Decreased balance, Decreased range of motion, Increased muscle spasms, Pain  Visit Diagnosis: Pain in right foot  Pain in right lower leg     Problem List Patient Active Problem List   Diagnosis Date Noted  . Chronic cholecystitis 05/22/2013   Geraldine Solar, SPT  Geraldine Solar 08/30/2016, 10:25 AM   This entire session was performed under direct supervision and direction of a licensed therapist/therapist assistant . I have personally read, edited and approve of the note as written. Collie Siad PT, DPT  [facility] [address] [place], [address] Phone: [phone]   Fax:   [phone]  Name: Dawn Vega MRN: [id number] Date of Birth: 1940/10/26

## 2016-09-01 ENCOUNTER — Encounter: Payer: Self-pay | Admitting: Physical Therapy

## 2016-09-01 ENCOUNTER — Ambulatory Visit: Payer: Commercial Managed Care - HMO | Admitting: Physical Therapy

## 2016-09-01 DIAGNOSIS — M79661 Pain in right lower leg: Secondary | ICD-10-CM

## 2016-09-01 DIAGNOSIS — M79671 Pain in right foot: Secondary | ICD-10-CM | POA: Diagnosis not present

## 2016-09-01 NOTE — Therapy (Signed)
[place] [facility] [address] [place], [address] Phone: [phone]   Fax:  [phone]  Physical Therapy Treatment  Patient Details  Name: Dawn Vega MRN: [id number] Date of Birth: 30-Nov-1940 Referring Provider: Feliberto Gottron, [place]  Encounter Date: 09/01/2016      PT End of Session - 09/01/16 0936    Visit Number 3   Number of Visits 13   Date for PT Re-Evaluation 09/21/16   Authorization Type 3/10   PT Start Time 0932   PT Stop Time 1015   PT Time Calculation (min) 43 min   Activity Tolerance Patient tolerated treatment well   Behavior During Therapy [facility] for tasks assessed/performed      Past Medical History:  Diagnosis Date  . Hyperlipidemia   . Hypertension   . Kidney stones 2014    Past Surgical History:  Procedure Laterality Date  . APPENDECTOMY  1963  . COLONOSCOPY  2010   Dr. Candace Cruise  . EXTERNAL EAR SURGERY Left    1970's  . FOOT SURGERY Left 1970  . [place]   due to cyst rupture    There were no vitals filed for this visit.      Subjective Assessment - 09/01/16 0933    Subjective Pt states that she feels great again. She states she has a little bit of a sting around her ankle, but other than that she has not had much issue with her R ankle.   Pertinent History Pt reports that the top of her R foot and up to her R knee is painful which started back in April 2017 with insidious onset. She states she had switched into wearing sandals (after wearing more supportive closed toe shoes) and had done a lot of walking in [place]; she reports later that night she had pain across the top of her foot. She initially attributed her pain to the change in shoes, but her pain has not changed since. She was put in a walking boot for 5 weeks but she states it did not really help. She has not been in the boot since June 2017. She states that if she walks with trunk flexion (like  over a shopping cart) she does not have foot pain. She also states that if she leans back her foot pain goes away. She reports her foot pain is worse at the day goes on. She has been doing some exercises (demonstrated DKTC and FABER stretch) that she found on the internet and she reports that she thinks it has helped some. She denies LBP currently but reports being stiff in the mornings. She does report injuring her low back about 20 years ago but denies any LBP in the last 10 years. She has never had this foot pain before. She reports a deep ache in the top of her foot and a deep burn along the lateral part of her lower leg. She reports intermittent issues with walking but reports standing still "kills me." She denies issues with stairs and curbs. She reports intermittent tingling, but can't recall what she was doing or when it occurs. She reports getting in the car especially hurts her leg. She reports after long bouts of driving her RLE feels more tired. Prior to performing her exercises, the pain would wake her up 3-4x/night but since starting her own exercises, she doesn't have issues with sleeping. She states the pain gets up to 7/10 at worse, 0/10 at  best. It is currently 2/10. This pain keeps her from being more active and she enjoys shopping and reading.   Limitations Standing;Walking   How long can you stand comfortably? pain occurs almost immediately; will eventually have to sit after about 15-20 mins   How long can you walk comfortably? intermittent issues with walking   Patient Stated Goals be able to shop and walk around without foot pain   Currently in Pain? Yes   Pain Score 2    Pain Location Ankle   Pain Orientation Right   Pain Descriptors / Indicators --  stinging   Pain Onset More than a month ago      MANUAL: Cross friction, efflurage, and trigger point ischemic release to peroneals, posterior tib, anterior tib x 38 mins; pt reported feeling better following treatment and stated  she had no pain with walking and did not have the "stinging" feeling in her ankle anymore  Multiple laps around gym assessing symptoms following manual therapy; pt reported no pain in R ankle  Educated pt on how to perform standing calf stretch at wall since she reported the calf stretch on the step caused increased toe pain; she reported the wall stretch as feeling better      PT Education - 09/01/16 1108    Education provided Yes   Education Details continue HEP, call next week if her ankle is still feeling good to cancel appointments and f/u in 2 weeks   Person(s) Educated Patient   Methods Explanation   Comprehension Verbalized understanding             PT Long Term Goals - 08/24/16 1547      PT LONG TERM GOAL #1   Title Pt will have improved LEFS score by >9 points to demo decreased pain and improved overall function.   Baseline 11/8: 35/80   Time 6   Period Weeks   Status New     PT LONG TERM GOAL #2   Title Pt will be able to stand for >1 hour with 2/10 pain or less to maximize participation in the community.    Baseline 11/8: pt has pain almost immediately and has to sit in order for R foot/lower leg to feel better   Time 6   Period Weeks   Status New     PT LONG TERM GOAL #3   Title Pt will be able to walk for >1 hour with 2/10 pain or less to maximize overall function in the community.   Baseline 11/8: intermittently has pain with walking; when she does, she has to hunch over in order to make it feel better (hunch over grocery cart).   Time 6   Period Weeks   Status New               Plan - 09/01/16 1111    Clinical Impression Statement Pt has made significant progress since starting therapy. She states she has not had much trouble with her ankle since last treatment session stating "I haven't had to walk hunched over because of the pain. This is manageable now." Pt continued with trigger points and TTP of posterior tib, distal peroneals, and anterior  tib, but following manual therapy, she reported no pain with walking or standing. Pt stated she will be doing a lot of driving and walking tomorrow so she will make note of how her R ankle does/feels at the end of the day. SPT educated pt that if she feels fine, to  call Monday and cancel her appointments and follow up in 2 weeks. SPT educated pt on how to perform self-soft tissue mobilization to manage symptoms at home during 2 weeks off.    Rehab Potential Good   Clinical Impairments Affecting Rehab Potential motivated, first time issue with R foot pain, previous bout of plantar fascitis which improved with orthotics   PT Frequency 2x / week   PT Duration 6 weeks   PT Treatment/Interventions Cryotherapy;Electrical Stimulation;Gait training;Stair training;Functional mobility training;Therapeutic activities;Therapeutic exercise;Balance training;Neuromuscular re-education;Patient/family education;Orthotic Fit/Training;Manual techniques;Passive range of motion;Compression bandaging;Dry needling;Taping   PT Next Visit Plan soft tissue to peroneals, posterior tib, anterior tib, gastroc-soleus; assess calf tightness; initiate strenthening program   Consulted and Agree with Plan of Care Patient      Patient will benefit from skilled therapeutic intervention in order to improve the following deficits and impairments:  Abnormal gait, Decreased activity tolerance, Decreased balance, Decreased range of motion, Increased muscle spasms, Pain  Visit Diagnosis: Pain in right foot  Pain in right lower leg     Problem List Patient Active Problem List   Diagnosis Date Noted  . Chronic cholecystitis 05/22/2013   Geraldine Solar, SPT  Geraldine Solar 09/01/2016, 11:16 AM  [place] [facility] [address], [place], [address] Phone: [phone]   Fax:  [phone]  Name: Ashtynn Schnell MRN: [id number] Date of Birth: 1940/10/19

## 2016-09-06 ENCOUNTER — Ambulatory Visit: Payer: Commercial Managed Care - HMO | Admitting: Physical Therapy

## 2016-09-06 DIAGNOSIS — M79661 Pain in right lower leg: Secondary | ICD-10-CM | POA: Diagnosis not present

## 2016-09-06 DIAGNOSIS — M79671 Pain in right foot: Secondary | ICD-10-CM | POA: Diagnosis not present

## 2016-09-06 NOTE — Therapy (Signed)
[place] [facility] [address], [place], [address] Phone: [phone]   Fax:  [phone]  Physical Therapy Treatment  Patient Details  Name: Dawn Vega MRN: [id number] Date of Birth: [birth year] Referring Provider: Feliberto Gottron, [place]  Encounter Date: 09/06/2016      PT End of Session - 09/06/16 0955    Visit Number 4   Number of Visits 13   Date for PT Re-Evaluation 09/21/16   Authorization Type 3/10   PT Start Time 0928   PT Stop Time 1010   PT Time Calculation (min) 42 min   Activity Tolerance Patient tolerated treatment well   Behavior During Therapy [facility] for tasks assessed/performed      Past Medical History:  Diagnosis Date  . Hyperlipidemia   . Hypertension   . Kidney stones 2014    Past Surgical History:  Procedure Laterality Date  . APPENDECTOMY  1963  . COLONOSCOPY  2010   Dr. Candace Cruise  . EXTERNAL EAR SURGERY Left    1970's  . FOOT SURGERY Left 1970  . [place]   due to cyst rupture    There were no vitals filed for this visit.      Subjective Assessment - 09/06/16 0949    Subjective Patient reports she did a lot of walking over the weekend, after some period of time it began to lead to increased shin pain/tingling. She reports overall she is ~50% improved, but continues to have pain/tingling in the anterior shin.    Pertinent History Pt reports that the top of her R foot and up to her R knee is painful which started back in April 2017 with insidious onset. She states she had switched into wearing sandals (after wearing more supportive closed toe shoes) and had done a lot of walking in [place]; she reports later that night she had pain across the top of her foot. She initially attributed her pain to the change in shoes, but her pain has not changed since. She was put in a walking boot for 5 weeks but she states it did not really help. She has not been in the boot  since June 2017. She states that if she walks with trunk flexion (like over a shopping cart) she does not have foot pain. She also states that if she leans back her foot pain goes away. She reports her foot pain is worse at the day goes on. She has been doing some exercises (demonstrated DKTC and FABER stretch) that she found on the internet and she reports that she thinks it has helped some. She denies LBP currently but reports being stiff in the mornings. She does report injuring her low back about 20 years ago but denies any LBP in the last 10 years. She has never had this foot pain before. She reports a deep ache in the top of her foot and a deep burn along the lateral part of her lower leg. She reports intermittent issues with walking but reports standing still "kills me." She denies issues with stairs and curbs. She reports intermittent tingling, but can't recall what she was doing or when it occurs. She reports getting in the car especially hurts her leg. She reports after long bouts of driving her RLE feels more tired. Prior to performing her exercises, the pain would wake her up 3-4x/night but since starting her own exercises, she doesn't have issues with sleeping. She states the pain gets  up to 7/10 at worse, 0/10 at best. It is currently 2/10. This pain keeps her from being more active and she enjoys shopping and reading.   Limitations Standing;Walking   How long can you stand comfortably? pain occurs almost immediately; will eventually have to sit after about 15-20 mins   How long can you walk comfortably? intermittent issues with walking   Patient Stated Goals be able to shop and walk around without foot pain   Currently in Pain? Yes   Pain Score --  Mild pain in shin region   Pain Location Leg   Pain Orientation Lower;Anterior;Right   Pain Descriptors / Indicators Aching   Pain Type Chronic pain   Pain Onset More than a month ago   Pain Frequency Intermittent   Aggravating Factors   Weightbearing activities.    Pain Relieving Factors Stretching and massage on the area.       Supine DF mobilizations at end range grade III 5 bouts x 30"   Soft tissue mobilization to superior aspect of foot, pain/stinging noted in distal third of medial tibia, reduced with prolonged STM. Pain in region just lateral to this as well in posterior tibialis, also reduced with STM.   Felt a little after 3 laps walking, but much reduced from when she came in. Performed standing calf stretch against the wall 3 bouts x 30" -- began to feel more stinging after 2 laps  Sidelying clamshells with red t-band x 8 repetitions for 2 sets (felt appropriately in her posterior hip)  Standing sidestepping with red t-band 2 sets x 8 repetitions                           PT Education - 09/06/16 0955    Education provided Yes   Education Details Stretch and massage multiple times throughout the day, hip strengthening exercises every other day.    Person(s) Educated Patient   Methods Explanation;Handout;Demonstration   Comprehension Verbalized understanding;Returned demonstration             PT Long Term Goals - 08/24/16 1547      PT LONG TERM GOAL #1   Title Pt will have improved LEFS score by >9 points to demo decreased pain and improved overall function.   Baseline 11/8: 35/80   Time 6   Period Weeks   Status New     PT LONG TERM GOAL #2   Title Pt will be able to stand for >1 hour with 2/10 pain or less to maximize participation in the community.    Baseline 11/8: pt has pain almost immediately and has to sit in order for R foot/lower leg to feel better   Time 6   Period Weeks   Status New     PT LONG TERM GOAL #3   Title Pt will be able to walk for >1 hour with 2/10 pain or less to maximize overall function in the community.   Baseline 11/8: intermittently has pain with walking; when she does, she has to hunch over in order to make it feel better (hunch over  grocery cart).   Time 6   Period Weeks   Status New               Plan - 09/06/16 0955    Clinical Impression Statement Patient reports roughly 50% improvement in symptoms to date. She continues to describe tingling of pain with prolonged ambulation and weightbearing, though not as  severe as it has been. In this session she does have pain with palpation around the posterior tibialis. She was educated and provided exercise to address on hip weakness in stance phase and contribution to pronation and subsequent pain mechanism.   Rehab Potential Good   Clinical Impairments Affecting Rehab Potential motivated, first time issue with R foot pain, previous bout of plantar fascitis which improved with orthotics   PT Frequency 2x / week   PT Duration 6 weeks   PT Treatment/Interventions Cryotherapy;Electrical Stimulation;Gait training;Stair training;Functional mobility training;Therapeutic activities;Therapeutic exercise;Balance training;Neuromuscular re-education;Patient/family education;Orthotic Fit/Training;Manual techniques;Passive range of motion;Compression bandaging;Dry needling;Taping   PT Next Visit Plan soft tissue to peroneals, posterior tib, anterior tib, gastroc-soleus; assess calf tightness; initiate strenthening program   Consulted and Agree with Plan of Care Patient      Patient will benefit from skilled therapeutic intervention in order to improve the following deficits and impairments:  Abnormal gait, Decreased activity tolerance, Decreased balance, Decreased range of motion, Increased muscle spasms, Pain  Visit Diagnosis: Pain in right foot  Pain in right lower leg     Problem List Patient Active Problem List   Diagnosis Date Noted  . Chronic cholecystitis 05/22/2013    Kerman Passey, PT, DPT    09/06/2016, 1:00 PM  [place] [facility] [address] [place], [address] Phone: [phone]    Fax:  [phone]  Name: Dawn Vega MRN: [id number] Date of Birth: December 22, 1940

## 2016-09-06 NOTE — Patient Instructions (Addendum)
Supine DF mobilizations at end range grade III 5 bouts x 30"   Soft tissue mobilization to superior aspect of foot, pain/stinging noted in distal third of medial tibia, reduced with prolonged STM. Pain in region just lateral to this as well in posterior tibialis, also reduced with STM.   Felt a little after 3 laps walking, but much reduced from when she came in. Performed standing calf stretch against the wall 3 bouts x 30" -- began to feel more stinging after 2 laps  Sidelying clamshells with red t-band x 8 repetitions for 2 sets (felt appropriately in her posterior hip)  Standing sidestepping with red t-band 2 sets x 8 repetitions

## 2016-09-12 ENCOUNTER — Ambulatory Visit: Payer: Commercial Managed Care - HMO | Admitting: Physical Therapy

## 2016-09-12 ENCOUNTER — Encounter: Payer: Commercial Managed Care - HMO | Admitting: Physical Therapy

## 2016-09-12 DIAGNOSIS — M79661 Pain in right lower leg: Secondary | ICD-10-CM | POA: Diagnosis not present

## 2016-09-12 DIAGNOSIS — M79671 Pain in right foot: Secondary | ICD-10-CM

## 2016-09-12 NOTE — Therapy (Signed)
[place] [facility] [address] [place], [address] Phone: [phone]   Fax:  [phone]  Physical Therapy Treatment  Patient Details  Name: Dawn Vega MRN: [id number] Date of Birth: [birth year] Referring Provider: Feliberto Gottron, [place]  Encounter Date: 09/12/2016      PT End of Session - 09/12/16 1616    Visit Number 5   Number of Visits 13   Date for PT Re-Evaluation 09/21/16   Authorization Type 3/10   PT Start Time 1450   PT Stop Time 1530   PT Time Calculation (min) 40 min   Activity Tolerance Patient tolerated treatment well   Behavior During Therapy [facility] for tasks assessed/performed      Past Medical History:  Diagnosis Date  . Hyperlipidemia   . Hypertension   . Kidney stones 2014    Past Surgical History:  Procedure Laterality Date  . APPENDECTOMY  1963  . COLONOSCOPY  2010   Dr. Candace Cruise  . EXTERNAL EAR SURGERY Left    1970's  . FOOT SURGERY Left 1970  . [place]   due to cyst rupture    There were no vitals filed for this visit.      Subjective Assessment - 09/12/16 1502    Subjective Patient reports she had has had several days of walking relatively pain free, Saturday she was planting and had to use her feet to push the dirt back into the holes (combination of plantarflexion and inversion) for a total of 10 plants. Thursday she had been baking for quite a while and began having more pain. Otherwise she was able to go shopping with no pain.    Pertinent History Pt reports that the top of her R foot and up to her R knee is painful which started back in April 2017 with insidious onset. She states she had switched into wearing sandals (after wearing more supportive closed toe shoes) and had done a lot of walking in [place]; she reports later that night she had pain across the top of her foot. She initially attributed her pain to the change in shoes, but her pain has  not changed since. She was put in a walking boot for 5 weeks but she states it did not really help. She has not been in the boot since June 2017. She states that if she walks with trunk flexion (like over a shopping cart) she does not have foot pain. She also states that if she leans back her foot pain goes away. She reports her foot pain is worse at the day goes on. She has been doing some exercises (demonstrated DKTC and FABER stretch) that she found on the internet and she reports that she thinks it has helped some. She denies LBP currently but reports being stiff in the mornings. She does report injuring her low back about 20 years ago but denies any LBP in the last 10 years. She has never had this foot pain before. She reports a deep ache in the top of her foot and a deep burn along the lateral part of her lower leg. She reports intermittent issues with walking but reports standing still "kills me." She denies issues with stairs and curbs. She reports intermittent tingling, but can't recall what she was doing or when it occurs. She reports getting in the car especially hurts her leg. She reports after long bouts of driving her RLE feels more tired. Prior to performing  her exercises, the pain would wake her up 3-4x/night but since starting her own exercises, she doesn't have issues with sleeping. She states the pain gets up to 7/10 at worse, 0/10 at best. It is currently 2/10. This pain keeps her from being more active and she enjoys shopping and reading.   Limitations Standing;Walking   How long can you stand comfortably? pain occurs almost immediately; will eventually have to sit after about 15-20 mins   How long can you walk comfortably? intermittent issues with walking   Patient Stated Goals be able to shop and walk around without foot pain   Currently in Pain? No/denies      Great toe mobilizations grade III on RLE well tolerated. (5 bouts of 30")   Soft tissue mobilization to posterior tibial  muscle belly (tender, reduced with plantarflexion).    Single leg balance demonstrates increased tendency for pronation and arch collapse on RLE relative LLE. Able to perform single leg heel raise on RLE.   DF mobilizations in supine grade III -- 3 bouts x 30" per bout, noted to have stinging at talo-crural joint which was alleviated with ambulation.   Patient initially brought in X-ray and asked for PT to educate, PT reported that the results appear normal for her age range and do not seem associated with her symptoms.                           PT Education - 09/12/16 1615    Education provided Yes   Education Details Educated patient on possible need for individualized orthotics, DF at the proximal metatarsal and contribution to symptoms.    Person(s) Educated Patient   Methods Explanation;Demonstration;Handout   Comprehension Verbalized understanding;Returned demonstration             PT Long Term Goals - 08/24/16 1547      PT LONG TERM GOAL #1   Title Pt will have improved LEFS score by >9 points to demo decreased pain and improved overall function.   Baseline 11/8: 35/80   Time 6   Period Weeks   Status New     PT LONG TERM GOAL #2   Title Pt will be able to stand for >1 hour with 2/10 pain or less to maximize participation in the community.    Baseline 11/8: pt has pain almost immediately and has to sit in order for R foot/lower leg to feel better   Time 6   Period Weeks   Status New     PT LONG TERM GOAL #3   Title Pt will be able to walk for >1 hour with 2/10 pain or less to maximize overall function in the community.   Baseline 11/8: intermittently has pain with walking; when she does, she has to hunch over in order to make it feel better (hunch over grocery cart).   Time 6   Period Weeks   Status New               Plan - 09/12/16 1617    Clinical Impression Statement Patient continues to demonstrate no signs of spinal involvement.  This appears to be related to decreased great toe push off/extension, collapse of longitudinal arch, resulting in posterior tibial hyper-activity and increased demand for DF ROM at proximal metarsal. Patient may benefit from customized orthotics, which she agreed with.    Rehab Potential Good   Clinical Impairments Affecting Rehab Potential motivated, first time issue with  R foot pain, previous bout of plantar fascitis which improved with orthotics   PT Frequency 2x / week   PT Duration 6 weeks   PT Treatment/Interventions Cryotherapy;Electrical Stimulation;Gait training;Stair training;Functional mobility training;Therapeutic activities;Therapeutic exercise;Balance training;Neuromuscular re-education;Patient/family education;Orthotic Fit/Training;Manual techniques;Passive range of motion;Compression bandaging;Dry needling;Taping   PT Next Visit Plan soft tissue to peroneals, posterior tib, anterior tib, gastroc-soleus; assess calf tightness; initiate strenthening program   Consulted and Agree with Plan of Care Patient      Patient will benefit from skilled therapeutic intervention in order to improve the following deficits and impairments:  Abnormal gait, Decreased activity tolerance, Decreased balance, Decreased range of motion, Increased muscle spasms, Pain  Visit Diagnosis: Pain in right foot  Pain in right lower leg     Problem List Patient Active Problem List   Diagnosis Date Noted  . Chronic cholecystitis 05/22/2013   Kerman Passey, PT, DPT    09/12/2016, 4:22 PM  [place] [facility] [address], [place], [address] Phone: [phone]   Fax:  [phone]  Name: Abagale Malecki MRN: [id number] Date of Birth: Sep 30, 1941

## 2016-09-20 ENCOUNTER — Encounter: Payer: Commercial Managed Care - HMO | Admitting: Physical Therapy

## 2016-09-21 ENCOUNTER — Ambulatory Visit: Payer: Commercial Managed Care - HMO | Attending: Orthopedic Surgery | Admitting: Physical Therapy

## 2016-09-21 DIAGNOSIS — M79661 Pain in right lower leg: Secondary | ICD-10-CM

## 2016-09-21 DIAGNOSIS — M79671 Pain in right foot: Secondary | ICD-10-CM | POA: Insufficient documentation

## 2016-09-21 NOTE — Patient Instructions (Addendum)
Assessed gait with shoes off-- noted to have decreased toe off on RLE (more 1st through 3rd)-- decreased DF noted as well from front view (increased whip) could be DF weakness/tightness or combination   Heel raises x 12 for 2 sets   Eccentric PF/concentric x 12 for 2 sets   Single leg stance x 3 for proprioception education and where weight should be on the foot   DF grade III mobilization x 3 for 30"   Gait observed with shoes on- noted to collapse into pronation on RLE > LLE

## 2016-09-22 ENCOUNTER — Encounter: Payer: Commercial Managed Care - HMO | Admitting: Physical Therapy

## 2016-09-22 NOTE — Therapy (Signed)
[facility] [address] [place], [address] Phone: [phone]   Fax:  [phone]  Physical Therapy Treatment  Patient Details  Name: Dawn Vega MRN: [id number] Date of Birth: [birth year] Referring Provider: Feliberto Gottron, [place]  Encounter Date: 09/21/2016      PT End of Session - 09/22/16 1354    Visit Number 6   Number of Visits 13   Date for PT Re-Evaluation 09/21/16   Authorization Type 4/10   PT Start Time 0848   PT Stop Time 0915   PT Time Calculation (min) 27 min   Activity Tolerance Patient tolerated treatment well   Behavior During Therapy [facility] for tasks assessed/performed      Past Medical History:  Diagnosis Date  . Hyperlipidemia   . Hypertension   . Kidney stones 2014    Past Surgical History:  Procedure Laterality Date  . APPENDECTOMY  1963  . COLONOSCOPY  2010   Dr. Candace Cruise  . EXTERNAL EAR SURGERY Left    1970's  . FOOT SURGERY Left 1970  . [place]   due to cyst rupture    There were no vitals filed for this visit.      Subjective Assessment - 09/21/16 0849    Subjective Patient reports she still feels some pain late in the day if she has been on her feet for a long time, otherwise no pain throughout the day. She reports pain is only at 8PM, and pain is not as intense as it was. Reports 70% better overall.    Pertinent History Pt reports that the top of her R foot and up to her R knee is painful which started back in April 2017 with insidious onset. She states she had switched into wearing sandals (after wearing more supportive closed toe shoes) and had done a lot of walking in [place]; she reports later that night she had pain across the top of her foot. She initially attributed her pain to the change in shoes, but her pain has not changed since. She was put in a walking boot for 5 weeks but she states it did not really help. She has not been in the  boot since June 2017. She states that if she walks with trunk flexion (like over a shopping cart) she does not have foot pain. She also states that if she leans back her foot pain goes away. She reports her foot pain is worse at the day goes on. She has been doing some exercises (demonstrated DKTC and FABER stretch) that she found on the internet and she reports that she thinks it has helped some. She denies LBP currently but reports being stiff in the mornings. She does report injuring her low back about 20 years ago but denies any LBP in the last 10 years. She has never had this foot pain before. She reports a deep ache in the top of her foot and a deep burn along the lateral part of her lower leg. She reports intermittent issues with walking but reports standing still "kills me." She denies issues with stairs and curbs. She reports intermittent tingling, but can't recall what she was doing or when it occurs. She reports getting in the car especially hurts her leg. She reports after long bouts of driving her RLE feels more tired. Prior to performing her exercises, the pain would wake her up 3-4x/night but since starting her own exercises, she doesn't have  issues with sleeping. She states the pain gets up to 7/10 at worse, 0/10 at best. It is currently 2/10. This pain keeps her from being more active and she enjoys shopping and reading.   Limitations Standing;Walking   How long can you stand comfortably? pain occurs almost immediately; will eventually have to sit after about 15-20 mins   How long can you walk comfortably? intermittent issues with walking   Patient Stated Goals be able to shop and walk around without foot pain   Currently in Pain? No/denies      Assessed gait with shoes off-- noted to have decreased toe off on RLE (more 1st through 3rd)-- decreased DF noted as well from front view (increased whip) could be DF weakness/tightness or combination   Heel raises x 12 for 2 sets   Eccentric  PF/concentric x 12 for 2 sets   Single leg stance x 3 for proprioception education and where weight should be on the foot   DF grade III mobilization x 3 for 30"   Gait observed with shoes on- noted to collapse into pronation on RLE > LLE                            PT Education - 09/22/16 1354    Education provided Yes   Education Details Continue with stretching, noticed DF weakness or tightness perhaps given mild alteration in gait which could be contributing to her symptoms.    Person(s) Educated Patient   Methods Explanation;Demonstration;Handout   Comprehension Verbalized understanding;Returned demonstration             PT Long Term Goals - 08/24/16 1547      PT LONG TERM GOAL #1   Title Pt will have improved LEFS score by >9 points to demo decreased pain and improved overall function.   Baseline 11/8: 35/80   Time 6   Period Weeks   Status New     PT LONG TERM GOAL #2   Title Pt will be able to stand for >1 hour with 2/10 pain or less to maximize participation in the community.    Baseline 11/8: pt has pain almost immediately and has to sit in order for R foot/lower leg to feel better   Time 6   Period Weeks   Status New     PT LONG TERM GOAL #3   Title Pt will be able to walk for >1 hour with 2/10 pain or less to maximize overall function in the community.   Baseline 11/8: intermittently has pain with walking; when she does, she has to hunch over in order to make it feel better (hunch over grocery cart).   Time 6   Period Weeks   Status New               Plan - 09/22/16 1355    Clinical Impression Statement Patient reporting at least 70% improvement. Therapist did continue to notice functional impairment in DF ROM (unclear if weakness, or decreased ROM or combination) addressed with manual therapy and exercise, both tolerated well. She now only has pain after extended period of walking and is able to return to no pain sooner  (doesnot last as long). Will continue to work on ankle/hip ROM and strengthening to improve symptoms reported by patient.    Rehab Potential Good   Clinical Impairments Affecting Rehab Potential motivated, first time issue with R foot pain, previous bout of plantar fascitis  which improved with orthotics   PT Frequency 2x / week   PT Duration 6 weeks   PT Treatment/Interventions Cryotherapy;Electrical Stimulation;Gait training;Stair training;Functional mobility training;Therapeutic activities;Therapeutic exercise;Balance training;Neuromuscular re-education;Patient/family education;Orthotic Fit/Training;Manual techniques;Passive range of motion;Compression bandaging;Dry needling;Taping   PT Next Visit Plan soft tissue to peroneals, posterior tib, anterior tib, gastroc-soleus; assess calf tightness; initiate strenthening program   Consulted and Agree with Plan of Care Patient      Patient will benefit from skilled therapeutic intervention in order to improve the following deficits and impairments:  Abnormal gait, Decreased activity tolerance, Decreased balance, Decreased range of motion, Increased muscle spasms, Pain  Visit Diagnosis: Pain in right foot  Pain in right lower leg     Problem List Patient Active Problem List   Diagnosis Date Noted  . Chronic cholecystitis 05/22/2013   Kerman Passey, PT, DPT    09/22/2016, 1:58 PM  [facility] [address] [place], [address] Phone: [phone]   Fax:  [phone]  Name: Dawn Vega MRN: [id number] Date of Birth: 1941-06-26

## 2016-09-27 ENCOUNTER — Ambulatory Visit: Payer: Commercial Managed Care - HMO | Admitting: Physical Therapy

## 2016-09-27 DIAGNOSIS — M79661 Pain in right lower leg: Secondary | ICD-10-CM

## 2016-09-27 DIAGNOSIS — M79671 Pain in right foot: Secondary | ICD-10-CM | POA: Diagnosis not present

## 2016-09-27 NOTE — Patient Instructions (Signed)
Great toe extension/DF stretch with palpation of plantar fascia -- noted to be very tight, multiple areas of pain along the plantar fascia.   Inspected shoes, patient has hard/rigid plastic Good Feet inserts.

## 2016-09-28 NOTE — Therapy (Signed)
[facility] [address] [place], [address] Phone: [phone]   Fax:  [phone]  Physical Therapy Treatment  Patient Details  Name: Dawn Vega MRN: [id number] Date of Birth: 29-Jul-1941 Referring Provider: Feliberto Gottron, [place]  Encounter Date: 09/27/2016      PT End of Session - 09/28/16 0935    Visit Number 7   Number of Visits 13   Date for PT Re-Evaluation 11/09/16   Authorization Type 4/10   PT Start Time 1750   PT Stop Time 1830   PT Time Calculation (min) 40 min   Activity Tolerance Patient tolerated treatment well   Behavior During Therapy [facility] for tasks assessed/performed      Past Medical History:  Diagnosis Date  . Hyperlipidemia   . Hypertension   . Kidney stones 2014    Past Surgical History:  Procedure Laterality Date  . APPENDECTOMY  1963  . COLONOSCOPY  2010   Dr. Candace Cruise  . EXTERNAL EAR SURGERY Left    1970's  . FOOT SURGERY Left 1970  . [place]   due to cyst rupture    There were no vitals filed for this visit.      Subjective Assessment - 09/27/16 1745    Subjective Patient reports having some tightness/soreness in posterior calf, generally her pain/complaint is on the antero-lateral portion of the lower leg, and the top of the foot. She reports mornings are good and she is fine until late afternoon.    Pertinent History Pt reports that the top of her R foot and up to her R knee is painful which started back in April 2017 with insidious onset. She states she had switched into wearing sandals (after wearing more supportive closed toe shoes) and had done a lot of walking in [place]; she reports later that night she had pain across the top of her foot. She initially attributed her pain to the change in shoes, but her pain has not changed since. She was put in a walking boot for 5 weeks but she states it did not really help. She has not been in the  boot since June 2017. She states that if she walks with trunk flexion (like over a shopping cart) she does not have foot pain. She also states that if she leans back her foot pain goes away. She reports her foot pain is worse at the day goes on. She has been doing some exercises (demonstrated DKTC and FABER stretch) that she found on the internet and she reports that she thinks it has helped some. She denies LBP currently but reports being stiff in the mornings. She does report injuring her low back about 20 years ago but denies any LBP in the last 10 years. She has never had this foot pain before. She reports a deep ache in the top of her foot and a deep burn along the lateral part of her lower leg. She reports intermittent issues with walking but reports standing still "kills me." She denies issues with stairs and curbs. She reports intermittent tingling, but can't recall what she was doing or when it occurs. She reports getting in the car especially hurts her leg. She reports after long bouts of driving her RLE feels more tired. Prior to performing her exercises, the pain would wake her up 3-4x/night but since starting her own exercises, she doesn't have issues with sleeping. She states the pain gets up to  7/10 at worse, 0/10 at best. It is currently 2/10. This pain keeps her from being more active and she enjoys shopping and reading.   Limitations Standing;Walking   How long can you stand comfortably? pain occurs almost immediately; will eventually have to sit after about 15-20 mins   How long can you walk comfortably? intermittent issues with walking   Patient Stated Goals be able to shop and walk around without foot pain   Currently in Pain? No/denies  Reports tightness in back of calf.        Assessed plantar fascia on RLE with great toe extension and DF at the ankle, noted pain throughout plantar fascia on RLE. Provided STM to the PF with reported decrease in pain with increased duration of  pressure applied   No pain with resisted inversion/eversion/plantarflexion/dorsiflexion   No pain or symptoms with SLR or slump test bilaterally   Prone Ely's test -- noted to have tightness/borderline pain with RLE, less of these symptoms on her LLE provided stretching bilaterally x 5 bouts of 45" on each LE   Provided grade III mobilizations over lumbar spine, pain/tightness (mild) noted around L1-L3, less at lower lumbar spine. Provided 3 bouts x 30-60" each symptomatic level, in return to standing noted increase in symptoms when trunk flexed initially, however during ambulation felt much better than baseline of session.   Prone on elbows x 2 minutes x 2 bouts (reported increased symptoms after completion in the R foot).   Provided repeat bout of mobilizations in L1-L3 areas for 20-45" at each symptoms level for 3 bouts, patient again reported improvement in symptoms after completion. Educated patient to provide update for therapist on Thursday morning.                         PT Education - 09/28/16 0934    Education provided Yes   Education Details PT may begin to treat back as well, provide follow up call on Thursday morning to provide update on symptoms.    Person(s) Educated Patient   Methods Explanation;Demonstration   Comprehension Verbalized understanding;Returned demonstration             PT Long Term Goals - 08/24/16 1547      PT LONG TERM GOAL #1   Title Pt will have improved LEFS score by >9 points to demo decreased pain and improved overall function.   Baseline 11/8: 35/80   Time 6   Period Weeks   Status New     PT LONG TERM GOAL #2   Title Pt will be able to stand for >1 hour with 2/10 pain or less to maximize participation in the community.    Baseline 11/8: pt has pain almost immediately and has to sit in order for R foot/lower leg to feel better   Time 6   Period Weeks   Status New     PT LONG TERM GOAL #3   Title Pt will be able  to walk for >1 hour with 2/10 pain or less to maximize overall function in the community.   Baseline 11/8: intermittently has pain with walking; when she does, she has to hunch over in order to make it feel better (hunch over grocery cart).   Time 6   Period Weeks   Status New               Plan - 09/28/16 0935    Clinical Impression Statement Patient reports about the  same level of improvement as last session, she continues to have discomfort by the end of the day. She initially presented with no evidence of lumbar involvement, however in this session with joint mobilizations she reports significant reduction in her symptoms in distal lower extremity. She continues to have flare up of pain with certian positions of trunk flexion. While she initially presented with many symptoms and features consistent with posterior tibial tendonitis, she appears to have some component of radiculopathy as well, which will  be addressed. Patient will call this week to provide feedback on symptoms after joint mobilizations.    Rehab Potential Good   Clinical Impairments Affecting Rehab Potential motivated, first time issue with R foot pain, previous bout of plantar fascitis which improved with orthotics   PT Frequency 2x / week   PT Duration 6 weeks   PT Treatment/Interventions Cryotherapy;Electrical Stimulation;Gait training;Stair training;Functional mobility training;Therapeutic activities;Therapeutic exercise;Balance training;Neuromuscular re-education;Patient/family education;Orthotic Fit/Training;Manual techniques;Passive range of motion;Compression bandaging;Dry needling;Taping   PT Next Visit Plan soft tissue to peroneals, posterior tib, anterior tib, gastroc-soleus; assess calf tightness; initiate strenthening program   Consulted and Agree with Plan of Care Patient      Patient will benefit from skilled therapeutic intervention in order to improve the following deficits and impairments:  Abnormal  gait, Decreased activity tolerance, Decreased balance, Decreased range of motion, Increased muscle spasms, Pain  Visit Diagnosis: Pain in right lower leg - Plan: PT plan of care cert/re-cert       G-Codes - Oct 07, 2016 0936    Functional Assessment Tool Used Patient report    Functional Limitation Mobility: Walking and moving around   Mobility: Walking and Moving Around Current Status [phone]) At least 1 percent but less than 20 percent impaired, limited or restricted   Mobility: Walking and Moving Around Goal Status [phone]) At least 1 percent but less than 20 percent impaired, limited or restricted      Problem List Patient Active Problem List   Diagnosis Date Noted  . Chronic cholecystitis 05/22/2013   Kerman Passey, PT, DPT    10/07/16, 9:41 AM  [facility] [address] [place], [address] Phone: [phone]   Fax:  [phone]  Name: Lauramae Mckinsey MRN: [id number] Date of Birth: [birth year]

## 2016-09-29 ENCOUNTER — Encounter: Payer: Commercial Managed Care - HMO | Admitting: Physical Therapy

## 2016-10-04 ENCOUNTER — Ambulatory Visit: Payer: Commercial Managed Care - HMO | Admitting: Physical Therapy

## 2016-10-04 ENCOUNTER — Encounter: Payer: Commercial Managed Care - HMO | Admitting: Physical Therapy

## 2016-10-04 DIAGNOSIS — M79661 Pain in right lower leg: Secondary | ICD-10-CM | POA: Diagnosis not present

## 2016-10-04 DIAGNOSIS — M79671 Pain in right foot: Secondary | ICD-10-CM | POA: Diagnosis not present

## 2016-10-04 NOTE — Patient Instructions (Addendum)
20 single leg heel raises bilaterally - no problems - no fatigue. On RLE felt pain initially around anterior talus between malleoli, wrapped laterally just inferior to lateral malleoli. As repetitions continued, reported pain in the lateral calf, reproducing her pain.   Squats with heel lift   Manual therapy to Talus with DF

## 2016-10-05 ENCOUNTER — Ambulatory Visit
Admission: RE | Admit: 2016-10-05 | Discharge: 2016-10-05 | Disposition: A | Discharge status: Home / Self Care | Payer: Commercial Managed Care - HMO | Source: Ambulatory Visit | Attending: Internal Medicine | Admitting: Internal Medicine

## 2016-10-05 DIAGNOSIS — Z1231 Encounter for screening mammogram for malignant neoplasm of breast: Secondary | ICD-10-CM | POA: Diagnosis not present

## 2016-10-05 NOTE — Therapy (Signed)
[facility] [address] [place], [address] Phone: [phone]   Fax:  [phone]  Physical Therapy Treatment  Patient Details  Name: Dawn Vega MRN: [id number] Date of Birth: June 08, 1941 Referring Provider: Feliberto Gottron, [place]  Encounter Date: 10/04/2016      PT End of Session - 10/05/16 0832    Visit Number 8   Number of Visits 13   Date for PT Re-Evaluation 11/09/16   Authorization Type 5/10   PT Start Time 1520   PT Stop Time 1550   PT Time Calculation (min) 30 min   Activity Tolerance Patient tolerated treatment well   Behavior During Therapy [facility] for tasks assessed/performed      Past Medical History:  Diagnosis Date  . Hyperlipidemia   . Hypertension   . Kidney stones 2014    Past Surgical History:  Procedure Laterality Date  . APPENDECTOMY  1963  . COLONOSCOPY  2010   Dr. Candace Cruise  . EXTERNAL EAR SURGERY Left    1970's  . FOOT SURGERY Left 1970  . [place]   due to cyst rupture    There were no vitals filed for this visit.      Subjective Assessment - 10/05/16 0829    Subjective Patient reports she was initially non-painful, but appeared to have same level if not exacerbated after spinal joint mobilizations. She continues to have roughly same level of discomfort at the end of the day.    Pertinent History Pt reports that the top of her R foot and up to her R knee is painful which started back in April 2017 with insidious onset. She states she had switched into wearing sandals (after wearing more supportive closed toe shoes) and had done a lot of walking in [place]; she reports later that night she had pain across the top of her foot. She initially attributed her pain to the change in shoes, but her pain has not changed since. She was put in a walking boot for 5 weeks but she states it did not really help. She has not been in the boot since June 2017. She states  that if she walks with trunk flexion (like over a shopping cart) she does not have foot pain. She also states that if she leans back her foot pain goes away. She reports her foot pain is worse at the day goes on. She has been doing some exercises (demonstrated DKTC and FABER stretch) that she found on the internet and she reports that she thinks it has helped some. She denies LBP currently but reports being stiff in the mornings. She does report injuring her low back about 20 years ago but denies any LBP in the last 10 years. She has never had this foot pain before. She reports a deep ache in the top of her foot and a deep burn along the lateral part of her lower leg. She reports intermittent issues with walking but reports standing still "kills me." She denies issues with stairs and curbs. She reports intermittent tingling, but can't recall what she was doing or when it occurs. She reports getting in the car especially hurts her leg. She reports after long bouts of driving her RLE feels more tired. Prior to performing her exercises, the pain would wake her up 3-4x/night but since starting her own exercises, she doesn't have issues with sleeping. She states the pain gets up to 7/10 at worse, 0/10  at best. It is currently 2/10. This pain keeps her from being more active and she enjoys shopping and reading.   Limitations Standing;Walking   How long can you stand comfortably? pain occurs almost immediately; will eventually have to sit after about 15-20 mins   How long can you walk comfortably? intermittent issues with walking   Patient Stated Goals be able to shop and walk around without foot pain   Currently in Pain? No/denies     Single leg calf raises x 20 per side -- no pain on LLE, began to reproduce her pain on R side after 10-12 repetitions   Performed A-P talus and calf DF mobilization grade III - x 5 bouts x 45" which patient reports alleviated most if not all of her symptoms.   Patient reports she  is having pain with sit to stand, noted this was in lateral calf during DF portion of ROM, provided heel lifts via 5# weights, patient reports she has instant reduction in symptoms with heel lift. She reports her shoes with small heel lift/wedge are much more comfortable for her than her tennis shoes, noted roughly 1/2" heel lift relative to forefoot.  Discussed referral to local orthotic PT with patient which she agreed to. She will likely benefit from heel lift and medial wedge for longitudinal arch collapse.                             PT Education - 10/05/16 0830    Education provided Yes   Education Details Contact information for orthotic therapist.    Person(s) Educated Patient   Methods Explanation;Demonstration   Comprehension Verbalized understanding;Returned demonstration             PT Long Term Goals - 08/24/16 1547      PT LONG TERM GOAL #1   Title Pt will have improved LEFS score by >9 points to demo decreased pain and improved overall function.   Baseline 11/8: 35/80   Time 6   Period Weeks   Status New     PT LONG TERM GOAL #2   Title Pt will be able to stand for >1 hour with 2/10 pain or less to maximize participation in the community.    Baseline 11/8: pt has pain almost immediately and has to sit in order for R foot/lower leg to feel better   Time 6   Period Weeks   Status New     PT LONG TERM GOAL #3   Title Pt will be able to walk for >1 hour with 2/10 pain or less to maximize overall function in the community.   Baseline 11/8: intermittently has pain with walking; when she does, she has to hunch over in order to make it feel better (hunch over grocery cart).   Time 6   Period Weeks   Status New               Plan - 10/05/16 [id number]    Clinical Impression Statement Patient does not show any level of improvement after spinal mobilizations. She does however have reproduction of her pain with single leg calf raises, which is  alleviated with DF/talus A-P mobilizations. She also has pain with sit to stands in her lateral calf, which is reduced with heel lift. Given the above information, it continues to appear she has involvement of lower leg musculature, likely impacted by loss of longitudinal arch, pronation, in her RLE. She would  likely benefit from orthotics, was referred to a therapist that designs these. Will follow up with patient after she has had orthotics in for ~ 1 week.    Rehab Potential Good   Clinical Impairments Affecting Rehab Potential motivated, first time issue with R foot pain, previous bout of plantar fascitis which improved with orthotics   PT Frequency 2x / week   PT Duration 6 weeks   PT Treatment/Interventions Cryotherapy;Electrical Stimulation;Gait training;Stair training;Functional mobility training;Therapeutic activities;Therapeutic exercise;Balance training;Neuromuscular re-education;Patient/family education;Orthotic Fit/Training;Manual techniques;Passive range of motion;Compression bandaging;Dry needling;Taping   PT Next Visit Plan soft tissue to peroneals, posterior tib, anterior tib, gastroc-soleus; assess calf tightness; initiate strenthening program   Consulted and Agree with Plan of Care Patient      Patient will benefit from skilled therapeutic intervention in order to improve the following deficits and impairments:  Abnormal gait, Decreased activity tolerance, Decreased balance, Decreased range of motion, Increased muscle spasms, Pain  Visit Diagnosis: Pain in right lower leg  Pain in right foot     Problem List Patient Active Problem List   Diagnosis Date Noted  . Chronic cholecystitis 05/22/2013   Kerman Passey, PT, DPT    10/05/2016, 8:35 AM  [facility] [address] [place], [address] Phone: [phone]   Fax:  [phone]  Name: Dawn Vega MRN: [id number] Date of Birth:  1941/09/10

## 2016-10-06 ENCOUNTER — Encounter: Payer: Commercial Managed Care - HMO | Admitting: Physical Therapy

## 2016-10-11 ENCOUNTER — Encounter: Payer: Commercial Managed Care - HMO | Admitting: Physical Therapy

## 2016-10-13 ENCOUNTER — Encounter: Payer: Commercial Managed Care - HMO | Admitting: Physical Therapy

## 2016-10-21 DIAGNOSIS — M19071 Primary osteoarthritis, right ankle and foot: Secondary | ICD-10-CM | POA: Diagnosis not present

## 2016-10-27 ENCOUNTER — Ambulatory Visit: Payer: Commercial Managed Care - HMO | Attending: Orthopedic Surgery | Admitting: Physical Therapy

## 2016-11-14 DIAGNOSIS — H40053 Ocular hypertension, bilateral: Secondary | ICD-10-CM | POA: Diagnosis not present

## 2016-11-17 DIAGNOSIS — H2513 Age-related nuclear cataract, bilateral: Secondary | ICD-10-CM | POA: Diagnosis not present

## 2017-01-16 DIAGNOSIS — R739 Hyperglycemia, unspecified: Secondary | ICD-10-CM | POA: Diagnosis not present

## 2017-01-16 DIAGNOSIS — M150 Primary generalized (osteo)arthritis: Secondary | ICD-10-CM | POA: Diagnosis not present

## 2017-01-16 DIAGNOSIS — E782 Mixed hyperlipidemia: Secondary | ICD-10-CM | POA: Diagnosis not present

## 2017-01-16 DIAGNOSIS — R251 Tremor, unspecified: Secondary | ICD-10-CM | POA: Diagnosis not present

## 2017-01-16 DIAGNOSIS — K219 Gastro-esophageal reflux disease without esophagitis: Secondary | ICD-10-CM | POA: Diagnosis not present

## 2017-01-16 DIAGNOSIS — I10 Essential (primary) hypertension: Secondary | ICD-10-CM | POA: Diagnosis not present

## 2017-01-16 DIAGNOSIS — E784 Other hyperlipidemia: Secondary | ICD-10-CM | POA: Diagnosis not present

## 2017-01-16 DIAGNOSIS — Z8679 Personal history of other diseases of the circulatory system: Secondary | ICD-10-CM | POA: Diagnosis not present

## 2017-04-14 DIAGNOSIS — M9903 Segmental and somatic dysfunction of lumbar region: Secondary | ICD-10-CM | POA: Diagnosis not present

## 2017-04-14 DIAGNOSIS — M9905 Segmental and somatic dysfunction of pelvic region: Secondary | ICD-10-CM | POA: Diagnosis not present

## 2017-04-14 DIAGNOSIS — M5416 Radiculopathy, lumbar region: Secondary | ICD-10-CM | POA: Diagnosis not present

## 2017-04-14 DIAGNOSIS — M955 Acquired deformity of pelvis: Secondary | ICD-10-CM | POA: Diagnosis not present

## 2017-04-17 DIAGNOSIS — M9903 Segmental and somatic dysfunction of lumbar region: Secondary | ICD-10-CM | POA: Diagnosis not present

## 2017-04-17 DIAGNOSIS — M9905 Segmental and somatic dysfunction of pelvic region: Secondary | ICD-10-CM | POA: Diagnosis not present

## 2017-04-17 DIAGNOSIS — M5416 Radiculopathy, lumbar region: Secondary | ICD-10-CM | POA: Diagnosis not present

## 2017-04-17 DIAGNOSIS — M955 Acquired deformity of pelvis: Secondary | ICD-10-CM | POA: Diagnosis not present

## 2017-04-18 DIAGNOSIS — M955 Acquired deformity of pelvis: Secondary | ICD-10-CM | POA: Diagnosis not present

## 2017-04-18 DIAGNOSIS — M5416 Radiculopathy, lumbar region: Secondary | ICD-10-CM | POA: Diagnosis not present

## 2017-04-18 DIAGNOSIS — M9903 Segmental and somatic dysfunction of lumbar region: Secondary | ICD-10-CM | POA: Diagnosis not present

## 2017-04-18 DIAGNOSIS — M9905 Segmental and somatic dysfunction of pelvic region: Secondary | ICD-10-CM | POA: Diagnosis not present

## 2017-04-19 IMAGING — MG MM DIGITAL SCREENING BILAT W/ CAD
4 series · 4 of 4 positions shown · non-contrast
Comparison: Previous exam(s).

CLINICAL DATA: Screening.

EXAM:
DIGITAL SCREENING BILATERAL MAMMOGRAM WITH CAD

[L MLO]
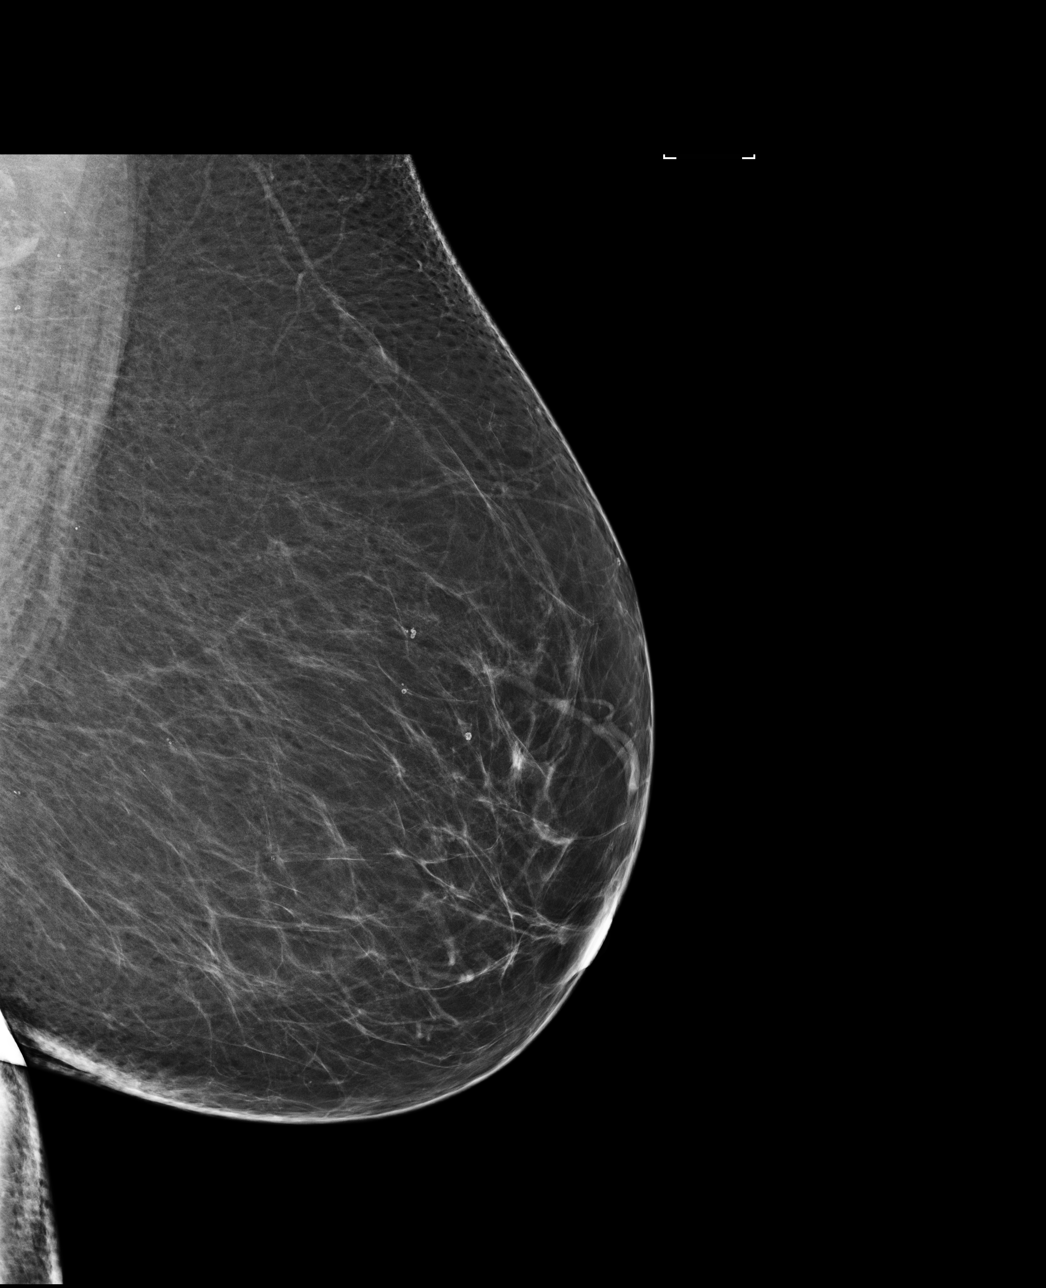

[R MLO]
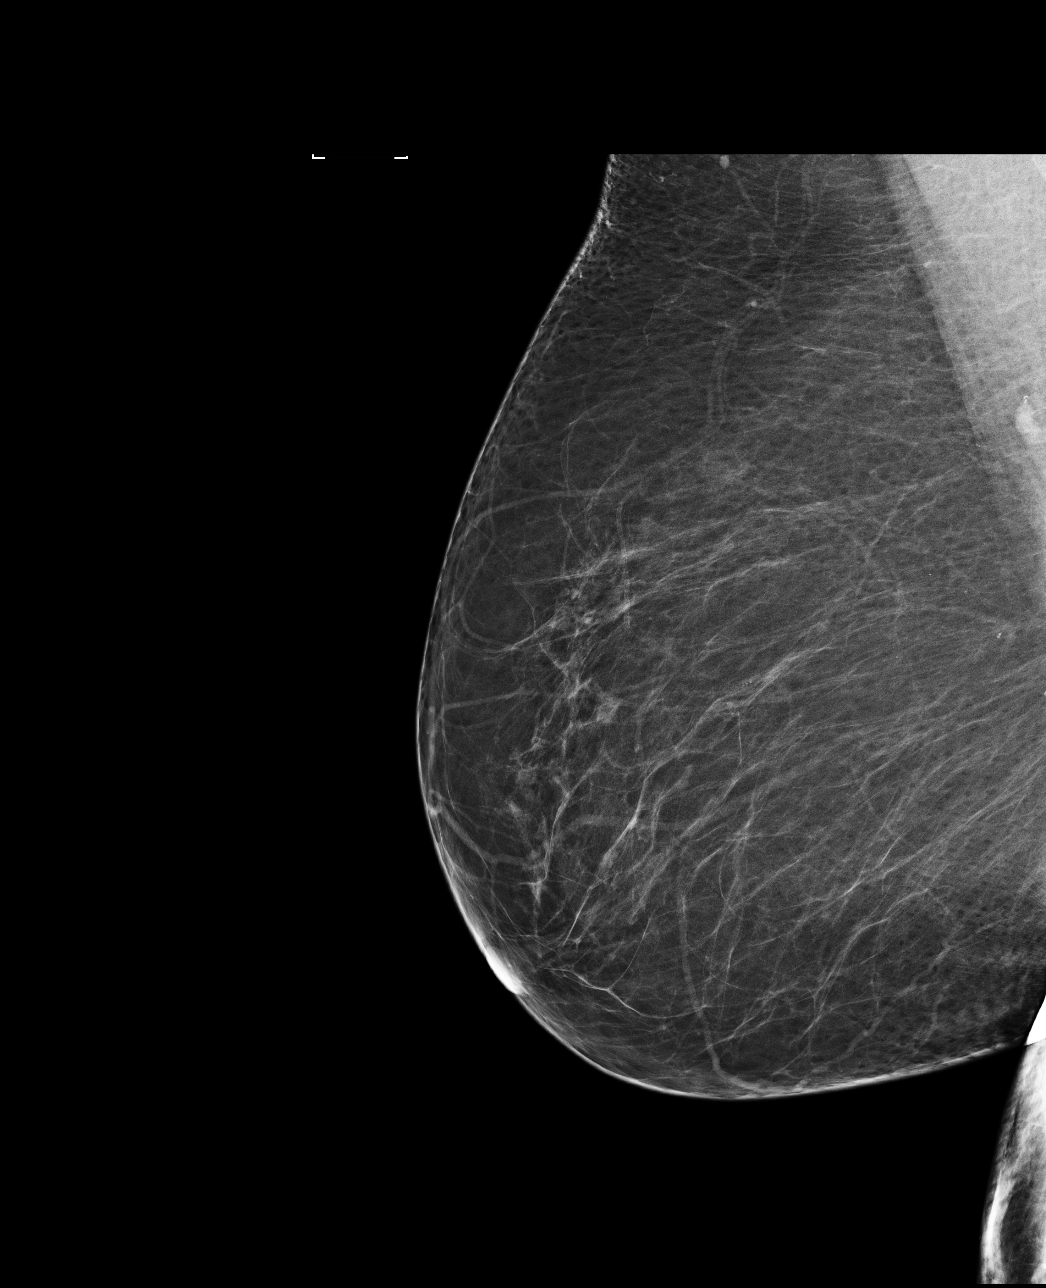

[L CC]
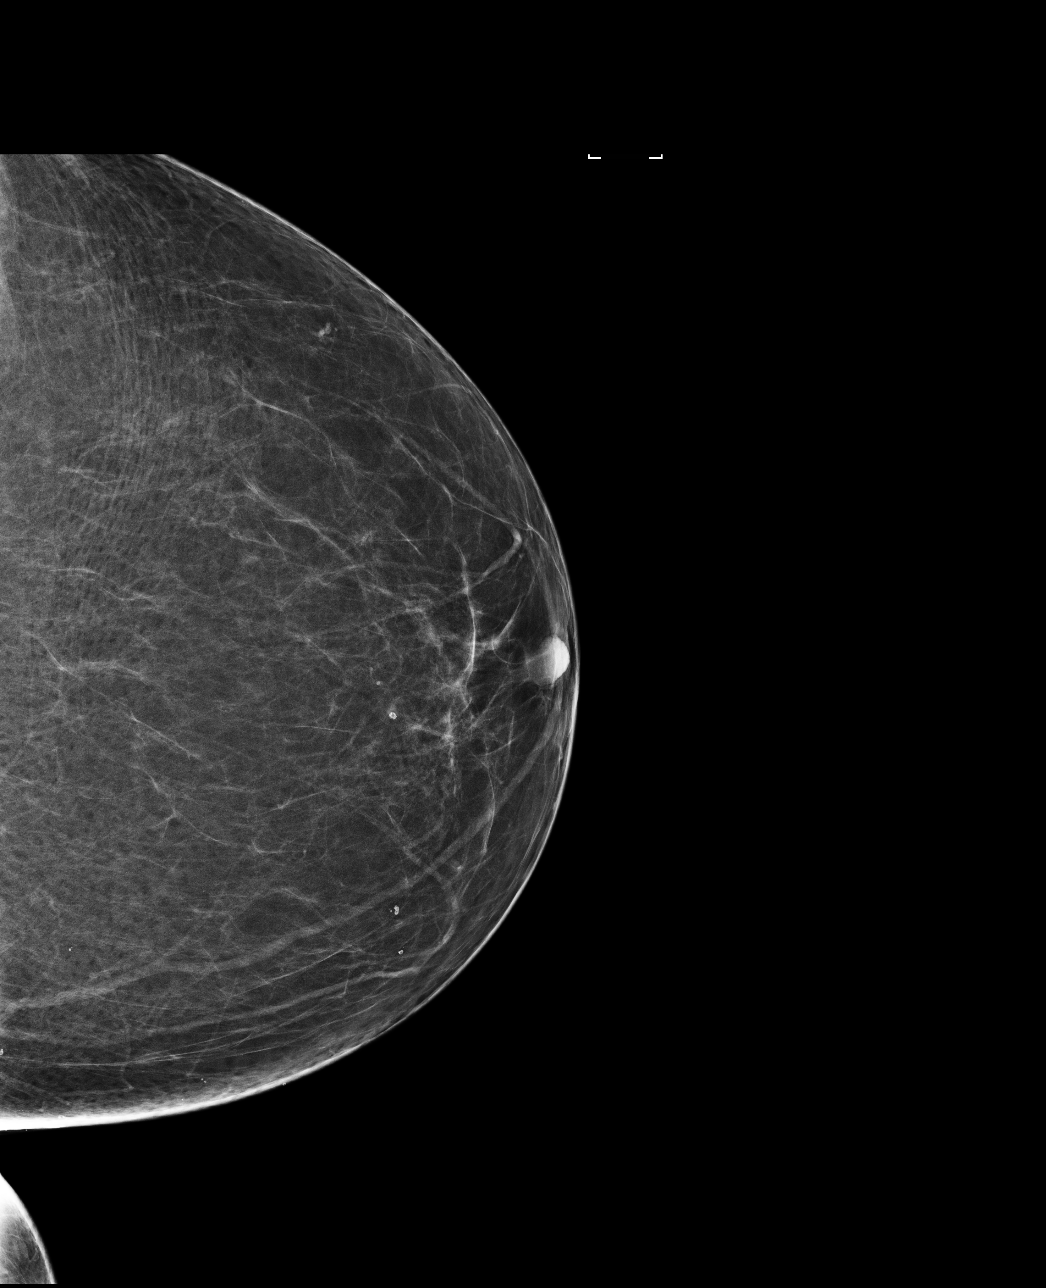

[R CC]
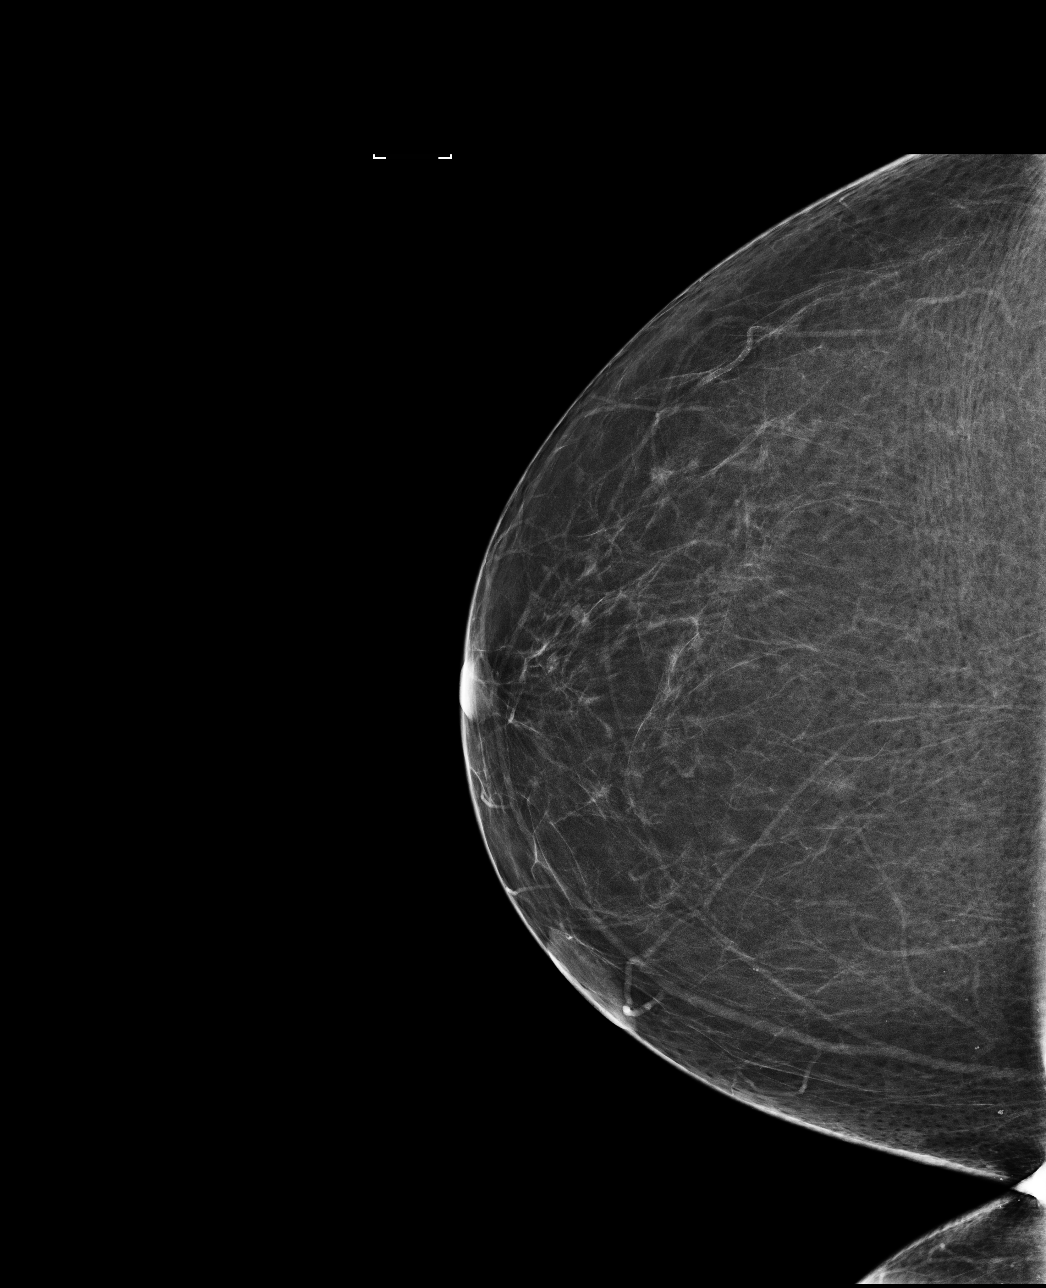

[4 of 4 positions shown; findings below may reference images not displayed]

ACR Breast Density Category b: There are scattered areas of
fibroglandular density.
FINDINGS: There are no findings suspicious for malignancy. Images were
processed with CAD.
IMPRESSION: No mammographic evidence of malignancy. A result letter of this
screening mammogram will be mailed directly to the patient.

RECOMMENDATION:
Screening mammogram in one year. (Code:AS-G-LCT)

BI-RADS CATEGORY  1: Negative.

## 2017-04-20 DIAGNOSIS — M9905 Segmental and somatic dysfunction of pelvic region: Secondary | ICD-10-CM | POA: Diagnosis not present

## 2017-04-20 DIAGNOSIS — M5416 Radiculopathy, lumbar region: Secondary | ICD-10-CM | POA: Diagnosis not present

## 2017-04-20 DIAGNOSIS — M955 Acquired deformity of pelvis: Secondary | ICD-10-CM | POA: Diagnosis not present

## 2017-04-20 DIAGNOSIS — M9903 Segmental and somatic dysfunction of lumbar region: Secondary | ICD-10-CM | POA: Diagnosis not present

## 2017-04-24 DIAGNOSIS — M5416 Radiculopathy, lumbar region: Secondary | ICD-10-CM | POA: Diagnosis not present

## 2017-04-24 DIAGNOSIS — M9905 Segmental and somatic dysfunction of pelvic region: Secondary | ICD-10-CM | POA: Diagnosis not present

## 2017-04-24 DIAGNOSIS — M9903 Segmental and somatic dysfunction of lumbar region: Secondary | ICD-10-CM | POA: Diagnosis not present

## 2017-04-24 DIAGNOSIS — M955 Acquired deformity of pelvis: Secondary | ICD-10-CM | POA: Diagnosis not present

## 2017-04-26 DIAGNOSIS — M955 Acquired deformity of pelvis: Secondary | ICD-10-CM | POA: Diagnosis not present

## 2017-04-26 DIAGNOSIS — M9903 Segmental and somatic dysfunction of lumbar region: Secondary | ICD-10-CM | POA: Diagnosis not present

## 2017-04-26 DIAGNOSIS — M9905 Segmental and somatic dysfunction of pelvic region: Secondary | ICD-10-CM | POA: Diagnosis not present

## 2017-04-26 DIAGNOSIS — M5416 Radiculopathy, lumbar region: Secondary | ICD-10-CM | POA: Diagnosis not present

## 2017-04-27 DIAGNOSIS — M955 Acquired deformity of pelvis: Secondary | ICD-10-CM | POA: Diagnosis not present

## 2017-04-27 DIAGNOSIS — M9903 Segmental and somatic dysfunction of lumbar region: Secondary | ICD-10-CM | POA: Diagnosis not present

## 2017-04-27 DIAGNOSIS — M5416 Radiculopathy, lumbar region: Secondary | ICD-10-CM | POA: Diagnosis not present

## 2017-04-27 DIAGNOSIS — M9905 Segmental and somatic dysfunction of pelvic region: Secondary | ICD-10-CM | POA: Diagnosis not present

## 2017-05-01 DIAGNOSIS — M9905 Segmental and somatic dysfunction of pelvic region: Secondary | ICD-10-CM | POA: Diagnosis not present

## 2017-05-01 DIAGNOSIS — M5416 Radiculopathy, lumbar region: Secondary | ICD-10-CM | POA: Diagnosis not present

## 2017-05-01 DIAGNOSIS — M955 Acquired deformity of pelvis: Secondary | ICD-10-CM | POA: Diagnosis not present

## 2017-05-01 DIAGNOSIS — M9903 Segmental and somatic dysfunction of lumbar region: Secondary | ICD-10-CM | POA: Diagnosis not present

## 2017-05-03 DIAGNOSIS — M9905 Segmental and somatic dysfunction of pelvic region: Secondary | ICD-10-CM | POA: Diagnosis not present

## 2017-05-03 DIAGNOSIS — M955 Acquired deformity of pelvis: Secondary | ICD-10-CM | POA: Diagnosis not present

## 2017-05-03 DIAGNOSIS — M5416 Radiculopathy, lumbar region: Secondary | ICD-10-CM | POA: Diagnosis not present

## 2017-05-03 DIAGNOSIS — M9903 Segmental and somatic dysfunction of lumbar region: Secondary | ICD-10-CM | POA: Diagnosis not present

## 2017-05-05 DIAGNOSIS — M9903 Segmental and somatic dysfunction of lumbar region: Secondary | ICD-10-CM | POA: Diagnosis not present

## 2017-05-05 DIAGNOSIS — M9905 Segmental and somatic dysfunction of pelvic region: Secondary | ICD-10-CM | POA: Diagnosis not present

## 2017-05-05 DIAGNOSIS — M5416 Radiculopathy, lumbar region: Secondary | ICD-10-CM | POA: Diagnosis not present

## 2017-05-05 DIAGNOSIS — M955 Acquired deformity of pelvis: Secondary | ICD-10-CM | POA: Diagnosis not present

## 2017-05-08 DIAGNOSIS — M5416 Radiculopathy, lumbar region: Secondary | ICD-10-CM | POA: Diagnosis not present

## 2017-05-08 DIAGNOSIS — M9903 Segmental and somatic dysfunction of lumbar region: Secondary | ICD-10-CM | POA: Diagnosis not present

## 2017-05-08 DIAGNOSIS — M955 Acquired deformity of pelvis: Secondary | ICD-10-CM | POA: Diagnosis not present

## 2017-05-08 DIAGNOSIS — M9905 Segmental and somatic dysfunction of pelvic region: Secondary | ICD-10-CM | POA: Diagnosis not present

## 2017-05-10 DIAGNOSIS — M955 Acquired deformity of pelvis: Secondary | ICD-10-CM | POA: Diagnosis not present

## 2017-05-10 DIAGNOSIS — M5416 Radiculopathy, lumbar region: Secondary | ICD-10-CM | POA: Diagnosis not present

## 2017-05-10 DIAGNOSIS — M9905 Segmental and somatic dysfunction of pelvic region: Secondary | ICD-10-CM | POA: Diagnosis not present

## 2017-05-10 DIAGNOSIS — M9903 Segmental and somatic dysfunction of lumbar region: Secondary | ICD-10-CM | POA: Diagnosis not present

## 2017-05-11 DIAGNOSIS — M9903 Segmental and somatic dysfunction of lumbar region: Secondary | ICD-10-CM | POA: Diagnosis not present

## 2017-05-11 DIAGNOSIS — M5416 Radiculopathy, lumbar region: Secondary | ICD-10-CM | POA: Diagnosis not present

## 2017-05-11 DIAGNOSIS — M9905 Segmental and somatic dysfunction of pelvic region: Secondary | ICD-10-CM | POA: Diagnosis not present

## 2017-05-11 DIAGNOSIS — M955 Acquired deformity of pelvis: Secondary | ICD-10-CM | POA: Diagnosis not present

## 2017-05-16 DIAGNOSIS — M955 Acquired deformity of pelvis: Secondary | ICD-10-CM | POA: Diagnosis not present

## 2017-05-16 DIAGNOSIS — M5416 Radiculopathy, lumbar region: Secondary | ICD-10-CM | POA: Diagnosis not present

## 2017-05-16 DIAGNOSIS — M9903 Segmental and somatic dysfunction of lumbar region: Secondary | ICD-10-CM | POA: Diagnosis not present

## 2017-05-16 DIAGNOSIS — M9905 Segmental and somatic dysfunction of pelvic region: Secondary | ICD-10-CM | POA: Diagnosis not present

## 2017-05-18 DIAGNOSIS — M955 Acquired deformity of pelvis: Secondary | ICD-10-CM | POA: Diagnosis not present

## 2017-05-18 DIAGNOSIS — M9905 Segmental and somatic dysfunction of pelvic region: Secondary | ICD-10-CM | POA: Diagnosis not present

## 2017-05-18 DIAGNOSIS — M9903 Segmental and somatic dysfunction of lumbar region: Secondary | ICD-10-CM | POA: Diagnosis not present

## 2017-05-18 DIAGNOSIS — M5416 Radiculopathy, lumbar region: Secondary | ICD-10-CM | POA: Diagnosis not present

## 2017-05-23 DIAGNOSIS — M955 Acquired deformity of pelvis: Secondary | ICD-10-CM | POA: Diagnosis not present

## 2017-05-23 DIAGNOSIS — M9903 Segmental and somatic dysfunction of lumbar region: Secondary | ICD-10-CM | POA: Diagnosis not present

## 2017-05-23 DIAGNOSIS — M5416 Radiculopathy, lumbar region: Secondary | ICD-10-CM | POA: Diagnosis not present

## 2017-05-23 DIAGNOSIS — M9905 Segmental and somatic dysfunction of pelvic region: Secondary | ICD-10-CM | POA: Diagnosis not present

## 2017-05-25 DIAGNOSIS — M5416 Radiculopathy, lumbar region: Secondary | ICD-10-CM | POA: Diagnosis not present

## 2017-05-25 DIAGNOSIS — M9903 Segmental and somatic dysfunction of lumbar region: Secondary | ICD-10-CM | POA: Diagnosis not present

## 2017-05-25 DIAGNOSIS — M9905 Segmental and somatic dysfunction of pelvic region: Secondary | ICD-10-CM | POA: Diagnosis not present

## 2017-05-25 DIAGNOSIS — M955 Acquired deformity of pelvis: Secondary | ICD-10-CM | POA: Diagnosis not present

## 2017-05-29 DIAGNOSIS — H40053 Ocular hypertension, bilateral: Secondary | ICD-10-CM | POA: Diagnosis not present

## 2017-05-30 DIAGNOSIS — M955 Acquired deformity of pelvis: Secondary | ICD-10-CM | POA: Diagnosis not present

## 2017-05-30 DIAGNOSIS — M9905 Segmental and somatic dysfunction of pelvic region: Secondary | ICD-10-CM | POA: Diagnosis not present

## 2017-05-30 DIAGNOSIS — M5416 Radiculopathy, lumbar region: Secondary | ICD-10-CM | POA: Diagnosis not present

## 2017-05-30 DIAGNOSIS — M9903 Segmental and somatic dysfunction of lumbar region: Secondary | ICD-10-CM | POA: Diagnosis not present

## 2017-06-01 DIAGNOSIS — M5416 Radiculopathy, lumbar region: Secondary | ICD-10-CM | POA: Diagnosis not present

## 2017-06-01 DIAGNOSIS — M9903 Segmental and somatic dysfunction of lumbar region: Secondary | ICD-10-CM | POA: Diagnosis not present

## 2017-06-01 DIAGNOSIS — M955 Acquired deformity of pelvis: Secondary | ICD-10-CM | POA: Diagnosis not present

## 2017-06-01 DIAGNOSIS — M9905 Segmental and somatic dysfunction of pelvic region: Secondary | ICD-10-CM | POA: Diagnosis not present

## 2017-06-06 DIAGNOSIS — M955 Acquired deformity of pelvis: Secondary | ICD-10-CM | POA: Diagnosis not present

## 2017-06-06 DIAGNOSIS — M5416 Radiculopathy, lumbar region: Secondary | ICD-10-CM | POA: Diagnosis not present

## 2017-06-06 DIAGNOSIS — M9903 Segmental and somatic dysfunction of lumbar region: Secondary | ICD-10-CM | POA: Diagnosis not present

## 2017-06-06 DIAGNOSIS — M9905 Segmental and somatic dysfunction of pelvic region: Secondary | ICD-10-CM | POA: Diagnosis not present

## 2017-06-08 DIAGNOSIS — M9903 Segmental and somatic dysfunction of lumbar region: Secondary | ICD-10-CM | POA: Diagnosis not present

## 2017-06-08 DIAGNOSIS — M9905 Segmental and somatic dysfunction of pelvic region: Secondary | ICD-10-CM | POA: Diagnosis not present

## 2017-06-08 DIAGNOSIS — M5416 Radiculopathy, lumbar region: Secondary | ICD-10-CM | POA: Diagnosis not present

## 2017-06-08 DIAGNOSIS — M955 Acquired deformity of pelvis: Secondary | ICD-10-CM | POA: Diagnosis not present

## 2017-06-14 DIAGNOSIS — M9905 Segmental and somatic dysfunction of pelvic region: Secondary | ICD-10-CM | POA: Diagnosis not present

## 2017-06-14 DIAGNOSIS — M5416 Radiculopathy, lumbar region: Secondary | ICD-10-CM | POA: Diagnosis not present

## 2017-06-14 DIAGNOSIS — M955 Acquired deformity of pelvis: Secondary | ICD-10-CM | POA: Diagnosis not present

## 2017-06-14 DIAGNOSIS — M9903 Segmental and somatic dysfunction of lumbar region: Secondary | ICD-10-CM | POA: Diagnosis not present

## 2017-06-21 DIAGNOSIS — M9903 Segmental and somatic dysfunction of lumbar region: Secondary | ICD-10-CM | POA: Diagnosis not present

## 2017-06-21 DIAGNOSIS — M5416 Radiculopathy, lumbar region: Secondary | ICD-10-CM | POA: Diagnosis not present

## 2017-06-21 DIAGNOSIS — M955 Acquired deformity of pelvis: Secondary | ICD-10-CM | POA: Diagnosis not present

## 2017-06-21 DIAGNOSIS — M9905 Segmental and somatic dysfunction of pelvic region: Secondary | ICD-10-CM | POA: Diagnosis not present

## 2017-06-27 DIAGNOSIS — M9903 Segmental and somatic dysfunction of lumbar region: Secondary | ICD-10-CM | POA: Diagnosis not present

## 2017-06-27 DIAGNOSIS — M955 Acquired deformity of pelvis: Secondary | ICD-10-CM | POA: Diagnosis not present

## 2017-06-27 DIAGNOSIS — M9905 Segmental and somatic dysfunction of pelvic region: Secondary | ICD-10-CM | POA: Diagnosis not present

## 2017-06-27 DIAGNOSIS — M5416 Radiculopathy, lumbar region: Secondary | ICD-10-CM | POA: Diagnosis not present

## 2017-07-14 DIAGNOSIS — J019 Acute sinusitis, unspecified: Secondary | ICD-10-CM | POA: Diagnosis not present

## 2017-07-17 DIAGNOSIS — M9905 Segmental and somatic dysfunction of pelvic region: Secondary | ICD-10-CM | POA: Diagnosis not present

## 2017-07-17 DIAGNOSIS — M5416 Radiculopathy, lumbar region: Secondary | ICD-10-CM | POA: Diagnosis not present

## 2017-07-17 DIAGNOSIS — M9903 Segmental and somatic dysfunction of lumbar region: Secondary | ICD-10-CM | POA: Diagnosis not present

## 2017-07-17 DIAGNOSIS — M955 Acquired deformity of pelvis: Secondary | ICD-10-CM | POA: Diagnosis not present

## 2017-07-19 DIAGNOSIS — R7303 Prediabetes: Secondary | ICD-10-CM | POA: Insufficient documentation

## 2017-07-19 DIAGNOSIS — M150 Primary generalized (osteo)arthritis: Secondary | ICD-10-CM | POA: Diagnosis not present

## 2017-07-19 DIAGNOSIS — R05 Cough: Secondary | ICD-10-CM | POA: Diagnosis not present

## 2017-07-19 DIAGNOSIS — Z0000 Encounter for general adult medical examination without abnormal findings: Secondary | ICD-10-CM | POA: Diagnosis not present

## 2017-07-19 DIAGNOSIS — I10 Essential (primary) hypertension: Secondary | ICD-10-CM | POA: Diagnosis not present

## 2017-07-19 DIAGNOSIS — K219 Gastro-esophageal reflux disease without esophagitis: Secondary | ICD-10-CM | POA: Diagnosis not present

## 2017-07-19 DIAGNOSIS — R739 Hyperglycemia, unspecified: Secondary | ICD-10-CM | POA: Diagnosis not present

## 2017-07-19 DIAGNOSIS — E7849 Other hyperlipidemia: Secondary | ICD-10-CM | POA: Diagnosis not present

## 2017-07-19 DIAGNOSIS — R251 Tremor, unspecified: Secondary | ICD-10-CM | POA: Diagnosis not present

## 2017-08-10 DIAGNOSIS — M955 Acquired deformity of pelvis: Secondary | ICD-10-CM | POA: Diagnosis not present

## 2017-08-10 DIAGNOSIS — M5416 Radiculopathy, lumbar region: Secondary | ICD-10-CM | POA: Diagnosis not present

## 2017-08-10 DIAGNOSIS — M9905 Segmental and somatic dysfunction of pelvic region: Secondary | ICD-10-CM | POA: Diagnosis not present

## 2017-08-10 DIAGNOSIS — M9903 Segmental and somatic dysfunction of lumbar region: Secondary | ICD-10-CM | POA: Diagnosis not present

## 2017-09-04 ENCOUNTER — Other Ambulatory Visit: Payer: Self-pay | Admitting: Internal Medicine

## 2017-09-04 DIAGNOSIS — M5416 Radiculopathy, lumbar region: Secondary | ICD-10-CM | POA: Diagnosis not present

## 2017-09-04 DIAGNOSIS — M955 Acquired deformity of pelvis: Secondary | ICD-10-CM | POA: Diagnosis not present

## 2017-09-04 DIAGNOSIS — M9905 Segmental and somatic dysfunction of pelvic region: Secondary | ICD-10-CM | POA: Diagnosis not present

## 2017-09-04 DIAGNOSIS — Z1231 Encounter for screening mammogram for malignant neoplasm of breast: Secondary | ICD-10-CM

## 2017-09-04 DIAGNOSIS — M9903 Segmental and somatic dysfunction of lumbar region: Secondary | ICD-10-CM | POA: Diagnosis not present

## 2017-10-02 DIAGNOSIS — M955 Acquired deformity of pelvis: Secondary | ICD-10-CM | POA: Diagnosis not present

## 2017-10-02 DIAGNOSIS — M9903 Segmental and somatic dysfunction of lumbar region: Secondary | ICD-10-CM | POA: Diagnosis not present

## 2017-10-02 DIAGNOSIS — M9905 Segmental and somatic dysfunction of pelvic region: Secondary | ICD-10-CM | POA: Diagnosis not present

## 2017-10-02 DIAGNOSIS — M5416 Radiculopathy, lumbar region: Secondary | ICD-10-CM | POA: Diagnosis not present

## 2017-10-11 ENCOUNTER — Ambulatory Visit
Admission: RE | Admit: 2017-10-11 | Discharge: 2017-10-11 | Disposition: A | Discharge status: Home / Self Care | Payer: Commercial Managed Care - HMO | Source: Ambulatory Visit | Attending: Internal Medicine | Admitting: Internal Medicine

## 2017-10-11 DIAGNOSIS — Z1231 Encounter for screening mammogram for malignant neoplasm of breast: Secondary | ICD-10-CM | POA: Diagnosis not present

## 2017-10-30 DIAGNOSIS — M9903 Segmental and somatic dysfunction of lumbar region: Secondary | ICD-10-CM | POA: Diagnosis not present

## 2017-10-30 DIAGNOSIS — M9905 Segmental and somatic dysfunction of pelvic region: Secondary | ICD-10-CM | POA: Diagnosis not present

## 2017-10-30 DIAGNOSIS — M955 Acquired deformity of pelvis: Secondary | ICD-10-CM | POA: Diagnosis not present

## 2017-10-30 DIAGNOSIS — M5416 Radiculopathy, lumbar region: Secondary | ICD-10-CM | POA: Diagnosis not present

## 2017-11-27 DIAGNOSIS — M9905 Segmental and somatic dysfunction of pelvic region: Secondary | ICD-10-CM | POA: Diagnosis not present

## 2017-11-27 DIAGNOSIS — H40053 Ocular hypertension, bilateral: Secondary | ICD-10-CM | POA: Diagnosis not present

## 2017-11-27 DIAGNOSIS — M955 Acquired deformity of pelvis: Secondary | ICD-10-CM | POA: Diagnosis not present

## 2017-11-27 DIAGNOSIS — M5416 Radiculopathy, lumbar region: Secondary | ICD-10-CM | POA: Diagnosis not present

## 2017-11-27 DIAGNOSIS — M9903 Segmental and somatic dysfunction of lumbar region: Secondary | ICD-10-CM | POA: Diagnosis not present

## 2017-12-04 DIAGNOSIS — H2513 Age-related nuclear cataract, bilateral: Secondary | ICD-10-CM | POA: Diagnosis not present

## 2017-12-25 DIAGNOSIS — M9903 Segmental and somatic dysfunction of lumbar region: Secondary | ICD-10-CM | POA: Diagnosis not present

## 2017-12-25 DIAGNOSIS — M9905 Segmental and somatic dysfunction of pelvic region: Secondary | ICD-10-CM | POA: Diagnosis not present

## 2017-12-25 DIAGNOSIS — M955 Acquired deformity of pelvis: Secondary | ICD-10-CM | POA: Diagnosis not present

## 2017-12-25 DIAGNOSIS — M5416 Radiculopathy, lumbar region: Secondary | ICD-10-CM | POA: Diagnosis not present

## 2018-01-16 DIAGNOSIS — I10 Essential (primary) hypertension: Secondary | ICD-10-CM | POA: Diagnosis not present

## 2018-01-16 DIAGNOSIS — M150 Primary generalized (osteo)arthritis: Secondary | ICD-10-CM | POA: Diagnosis not present

## 2018-01-16 DIAGNOSIS — E7849 Other hyperlipidemia: Secondary | ICD-10-CM | POA: Diagnosis not present

## 2018-01-16 DIAGNOSIS — K219 Gastro-esophageal reflux disease without esophagitis: Secondary | ICD-10-CM | POA: Diagnosis not present

## 2018-01-16 DIAGNOSIS — R739 Hyperglycemia, unspecified: Secondary | ICD-10-CM | POA: Diagnosis not present

## 2018-01-17 DIAGNOSIS — I10 Essential (primary) hypertension: Secondary | ICD-10-CM | POA: Diagnosis not present

## 2018-01-17 DIAGNOSIS — E7849 Other hyperlipidemia: Secondary | ICD-10-CM | POA: Diagnosis not present

## 2018-01-17 DIAGNOSIS — R739 Hyperglycemia, unspecified: Secondary | ICD-10-CM | POA: Diagnosis not present

## 2018-01-17 DIAGNOSIS — K219 Gastro-esophageal reflux disease without esophagitis: Secondary | ICD-10-CM | POA: Diagnosis not present

## 2018-01-22 DIAGNOSIS — M9903 Segmental and somatic dysfunction of lumbar region: Secondary | ICD-10-CM | POA: Diagnosis not present

## 2018-01-22 DIAGNOSIS — M955 Acquired deformity of pelvis: Secondary | ICD-10-CM | POA: Diagnosis not present

## 2018-01-22 DIAGNOSIS — M9905 Segmental and somatic dysfunction of pelvic region: Secondary | ICD-10-CM | POA: Diagnosis not present

## 2018-01-22 DIAGNOSIS — M5416 Radiculopathy, lumbar region: Secondary | ICD-10-CM | POA: Diagnosis not present

## 2018-02-19 DIAGNOSIS — M5416 Radiculopathy, lumbar region: Secondary | ICD-10-CM | POA: Diagnosis not present

## 2018-02-19 DIAGNOSIS — M955 Acquired deformity of pelvis: Secondary | ICD-10-CM | POA: Diagnosis not present

## 2018-02-19 DIAGNOSIS — M9905 Segmental and somatic dysfunction of pelvic region: Secondary | ICD-10-CM | POA: Diagnosis not present

## 2018-02-19 DIAGNOSIS — M9903 Segmental and somatic dysfunction of lumbar region: Secondary | ICD-10-CM | POA: Diagnosis not present

## 2018-03-21 DIAGNOSIS — M9905 Segmental and somatic dysfunction of pelvic region: Secondary | ICD-10-CM | POA: Diagnosis not present

## 2018-03-21 DIAGNOSIS — M5416 Radiculopathy, lumbar region: Secondary | ICD-10-CM | POA: Diagnosis not present

## 2018-03-21 DIAGNOSIS — M9903 Segmental and somatic dysfunction of lumbar region: Secondary | ICD-10-CM | POA: Diagnosis not present

## 2018-03-21 DIAGNOSIS — M955 Acquired deformity of pelvis: Secondary | ICD-10-CM | POA: Diagnosis not present

## 2018-03-26 DIAGNOSIS — M955 Acquired deformity of pelvis: Secondary | ICD-10-CM | POA: Diagnosis not present

## 2018-03-26 DIAGNOSIS — M5416 Radiculopathy, lumbar region: Secondary | ICD-10-CM | POA: Diagnosis not present

## 2018-03-26 DIAGNOSIS — M9903 Segmental and somatic dysfunction of lumbar region: Secondary | ICD-10-CM | POA: Diagnosis not present

## 2018-03-26 DIAGNOSIS — M9905 Segmental and somatic dysfunction of pelvic region: Secondary | ICD-10-CM | POA: Diagnosis not present

## 2018-04-09 DIAGNOSIS — M955 Acquired deformity of pelvis: Secondary | ICD-10-CM | POA: Diagnosis not present

## 2018-04-09 DIAGNOSIS — M5416 Radiculopathy, lumbar region: Secondary | ICD-10-CM | POA: Diagnosis not present

## 2018-04-09 DIAGNOSIS — M9905 Segmental and somatic dysfunction of pelvic region: Secondary | ICD-10-CM | POA: Diagnosis not present

## 2018-04-09 DIAGNOSIS — M9903 Segmental and somatic dysfunction of lumbar region: Secondary | ICD-10-CM | POA: Diagnosis not present

## 2018-04-18 DIAGNOSIS — D2262 Melanocytic nevi of left upper limb, including shoulder: Secondary | ICD-10-CM | POA: Diagnosis not present

## 2018-04-18 DIAGNOSIS — L821 Other seborrheic keratosis: Secondary | ICD-10-CM | POA: Diagnosis not present

## 2018-04-18 DIAGNOSIS — D225 Melanocytic nevi of trunk: Secondary | ICD-10-CM | POA: Diagnosis not present

## 2018-04-18 DIAGNOSIS — B078 Other viral warts: Secondary | ICD-10-CM | POA: Diagnosis not present

## 2018-04-18 DIAGNOSIS — D2271 Melanocytic nevi of right lower limb, including hip: Secondary | ICD-10-CM | POA: Diagnosis not present

## 2018-04-18 DIAGNOSIS — D2261 Melanocytic nevi of right upper limb, including shoulder: Secondary | ICD-10-CM | POA: Diagnosis not present

## 2018-04-18 DIAGNOSIS — L538 Other specified erythematous conditions: Secondary | ICD-10-CM | POA: Diagnosis not present

## 2018-04-18 DIAGNOSIS — D2272 Melanocytic nevi of left lower limb, including hip: Secondary | ICD-10-CM | POA: Diagnosis not present

## 2018-04-23 DIAGNOSIS — M955 Acquired deformity of pelvis: Secondary | ICD-10-CM | POA: Diagnosis not present

## 2018-04-23 DIAGNOSIS — M9905 Segmental and somatic dysfunction of pelvic region: Secondary | ICD-10-CM | POA: Diagnosis not present

## 2018-04-23 DIAGNOSIS — M5416 Radiculopathy, lumbar region: Secondary | ICD-10-CM | POA: Diagnosis not present

## 2018-04-23 DIAGNOSIS — M9903 Segmental and somatic dysfunction of lumbar region: Secondary | ICD-10-CM | POA: Diagnosis not present

## 2018-05-29 ENCOUNTER — Other Ambulatory Visit: Payer: Self-pay | Admitting: Orthopedic Surgery

## 2018-05-29 DIAGNOSIS — M47816 Spondylosis without myelopathy or radiculopathy, lumbar region: Secondary | ICD-10-CM | POA: Diagnosis not present

## 2018-05-29 DIAGNOSIS — M5441 Lumbago with sciatica, right side: Secondary | ICD-10-CM | POA: Diagnosis not present

## 2018-05-29 DIAGNOSIS — M5136 Other intervertebral disc degeneration, lumbar region: Secondary | ICD-10-CM | POA: Diagnosis not present

## 2018-05-29 DIAGNOSIS — M5416 Radiculopathy, lumbar region: Secondary | ICD-10-CM | POA: Diagnosis not present

## 2018-05-29 DIAGNOSIS — M170 Bilateral primary osteoarthritis of knee: Secondary | ICD-10-CM | POA: Diagnosis not present

## 2018-06-01 ENCOUNTER — Ambulatory Visit
Admission: RE | Admit: 2018-06-01 | Discharge: 2018-06-01 | Disposition: A | Discharge status: Home / Self Care | Payer: Medicare HMO | Source: Ambulatory Visit | Attending: Orthopedic Surgery | Admitting: Orthopedic Surgery

## 2018-06-01 ENCOUNTER — Other Ambulatory Visit: Payer: Self-pay | Admitting: Orthopedic Surgery

## 2018-06-01 DIAGNOSIS — Z01812 Encounter for preprocedural laboratory examination: Secondary | ICD-10-CM | POA: Diagnosis not present

## 2018-06-01 DIAGNOSIS — Z5309 Procedure and treatment not carried out because of other contraindication: Secondary | ICD-10-CM

## 2018-06-04 ENCOUNTER — Ambulatory Visit: Admission: RE | Admit: 2018-06-04 | Payer: Commercial Managed Care - HMO | Source: Ambulatory Visit

## 2018-06-20 DIAGNOSIS — M5416 Radiculopathy, lumbar region: Secondary | ICD-10-CM | POA: Diagnosis not present

## 2018-06-20 DIAGNOSIS — M9903 Segmental and somatic dysfunction of lumbar region: Secondary | ICD-10-CM | POA: Diagnosis not present

## 2018-06-20 DIAGNOSIS — M9905 Segmental and somatic dysfunction of pelvic region: Secondary | ICD-10-CM | POA: Diagnosis not present

## 2018-06-20 DIAGNOSIS — M955 Acquired deformity of pelvis: Secondary | ICD-10-CM | POA: Diagnosis not present

## 2018-07-10 ENCOUNTER — Other Ambulatory Visit: Payer: Self-pay | Admitting: Orthopedic Surgery

## 2018-07-10 ENCOUNTER — Other Ambulatory Visit: Payer: Self-pay | Admitting: Internal Medicine

## 2018-07-10 DIAGNOSIS — M5416 Radiculopathy, lumbar region: Secondary | ICD-10-CM

## 2018-07-10 DIAGNOSIS — M5136 Other intervertebral disc degeneration, lumbar region: Secondary | ICD-10-CM

## 2018-07-10 DIAGNOSIS — M5441 Lumbago with sciatica, right side: Secondary | ICD-10-CM

## 2018-07-10 DIAGNOSIS — M47816 Spondylosis without myelopathy or radiculopathy, lumbar region: Secondary | ICD-10-CM

## 2018-07-18 DIAGNOSIS — M9903 Segmental and somatic dysfunction of lumbar region: Secondary | ICD-10-CM | POA: Diagnosis not present

## 2018-07-18 DIAGNOSIS — M955 Acquired deformity of pelvis: Secondary | ICD-10-CM | POA: Diagnosis not present

## 2018-07-18 DIAGNOSIS — M9905 Segmental and somatic dysfunction of pelvic region: Secondary | ICD-10-CM | POA: Diagnosis not present

## 2018-07-18 DIAGNOSIS — M5416 Radiculopathy, lumbar region: Secondary | ICD-10-CM | POA: Diagnosis not present

## 2018-07-20 ENCOUNTER — Ambulatory Visit
Admission: RE | Admit: 2018-07-20 | Discharge: 2018-07-20 | Disposition: A | Discharge status: Home / Self Care | Payer: Medicare HMO | Source: Ambulatory Visit | Attending: Orthopedic Surgery | Admitting: Orthopedic Surgery

## 2018-07-20 DIAGNOSIS — M48061 Spinal stenosis, lumbar region without neurogenic claudication: Secondary | ICD-10-CM | POA: Diagnosis not present

## 2018-07-20 DIAGNOSIS — M5441 Lumbago with sciatica, right side: Secondary | ICD-10-CM

## 2018-07-20 DIAGNOSIS — M4726 Other spondylosis with radiculopathy, lumbar region: Secondary | ICD-10-CM | POA: Insufficient documentation

## 2018-07-20 DIAGNOSIS — M5416 Radiculopathy, lumbar region: Secondary | ICD-10-CM

## 2018-07-20 DIAGNOSIS — M5116 Intervertebral disc disorders with radiculopathy, lumbar region: Secondary | ICD-10-CM | POA: Insufficient documentation

## 2018-07-20 DIAGNOSIS — M47816 Spondylosis without myelopathy or radiculopathy, lumbar region: Secondary | ICD-10-CM

## 2018-07-20 DIAGNOSIS — M5136 Other intervertebral disc degeneration, lumbar region: Secondary | ICD-10-CM

## 2018-07-20 MED ORDER — IOPAMIDOL (ISOVUE-M 200) INJECTION 41%
20.0000 mL | Freq: Once | INTRAMUSCULAR | Status: AC
  Administered 2018-07-20: 20 mL via INTRATHECAL

## 2018-07-20 MED ORDER — ACETAMINOPHEN 500 MG PO TABS
1000.0000 mg | ORAL_TABLET | Freq: Four times a day (QID) | ORAL | Status: DC | PRN
  Filled 2018-07-20: qty 2

## 2018-07-20 NOTE — Progress Notes (Signed)
Per MD verbal order, ok to discharge patient to home at this time

## 2018-07-25 DIAGNOSIS — I739 Peripheral vascular disease, unspecified: Secondary | ICD-10-CM | POA: Insufficient documentation

## 2018-07-25 DIAGNOSIS — R739 Hyperglycemia, unspecified: Secondary | ICD-10-CM | POA: Diagnosis not present

## 2018-07-25 DIAGNOSIS — K219 Gastro-esophageal reflux disease without esophagitis: Secondary | ICD-10-CM | POA: Diagnosis not present

## 2018-07-25 DIAGNOSIS — E7849 Other hyperlipidemia: Secondary | ICD-10-CM | POA: Diagnosis not present

## 2018-07-25 DIAGNOSIS — R251 Tremor, unspecified: Secondary | ICD-10-CM | POA: Diagnosis not present

## 2018-07-25 DIAGNOSIS — I10 Essential (primary) hypertension: Secondary | ICD-10-CM | POA: Diagnosis not present

## 2018-07-25 DIAGNOSIS — Z23 Encounter for immunization: Secondary | ICD-10-CM | POA: Diagnosis not present

## 2018-07-25 DIAGNOSIS — Z0000 Encounter for general adult medical examination without abnormal findings: Secondary | ICD-10-CM | POA: Diagnosis not present

## 2018-07-25 DIAGNOSIS — M150 Primary generalized (osteo)arthritis: Secondary | ICD-10-CM | POA: Diagnosis not present

## 2018-07-25 DIAGNOSIS — M5136 Other intervertebral disc degeneration, lumbar region: Secondary | ICD-10-CM | POA: Insufficient documentation

## 2018-07-25 DIAGNOSIS — R937 Abnormal findings on diagnostic imaging of other parts of musculoskeletal system: Secondary | ICD-10-CM | POA: Diagnosis not present

## 2018-07-27 ENCOUNTER — Ambulatory Visit: Payer: Medicare HMO

## 2018-07-31 DIAGNOSIS — R937 Abnormal findings on diagnostic imaging of other parts of musculoskeletal system: Secondary | ICD-10-CM | POA: Diagnosis not present

## 2018-08-08 DIAGNOSIS — M5136 Other intervertebral disc degeneration, lumbar region: Secondary | ICD-10-CM | POA: Diagnosis not present

## 2018-08-08 DIAGNOSIS — M48062 Spinal stenosis, lumbar region with neurogenic claudication: Secondary | ICD-10-CM | POA: Diagnosis not present

## 2018-08-08 DIAGNOSIS — R9389 Abnormal findings on diagnostic imaging of other specified body structures: Secondary | ICD-10-CM | POA: Diagnosis not present

## 2018-08-08 DIAGNOSIS — M5416 Radiculopathy, lumbar region: Secondary | ICD-10-CM | POA: Diagnosis not present

## 2018-08-08 DIAGNOSIS — M47816 Spondylosis without myelopathy or radiculopathy, lumbar region: Secondary | ICD-10-CM | POA: Diagnosis not present

## 2018-08-22 DIAGNOSIS — M9905 Segmental and somatic dysfunction of pelvic region: Secondary | ICD-10-CM | POA: Diagnosis not present

## 2018-08-22 DIAGNOSIS — M955 Acquired deformity of pelvis: Secondary | ICD-10-CM | POA: Diagnosis not present

## 2018-08-22 DIAGNOSIS — M9903 Segmental and somatic dysfunction of lumbar region: Secondary | ICD-10-CM | POA: Diagnosis not present

## 2018-08-22 DIAGNOSIS — M5416 Radiculopathy, lumbar region: Secondary | ICD-10-CM | POA: Diagnosis not present

## 2018-08-28 DIAGNOSIS — N882 Stricture and stenosis of cervix uteri: Secondary | ICD-10-CM | POA: Diagnosis not present

## 2018-08-28 DIAGNOSIS — R9389 Abnormal findings on diagnostic imaging of other specified body structures: Secondary | ICD-10-CM | POA: Diagnosis not present

## 2018-08-28 DIAGNOSIS — N840 Polyp of corpus uteri: Secondary | ICD-10-CM | POA: Diagnosis not present

## 2018-09-05 DIAGNOSIS — M48062 Spinal stenosis, lumbar region with neurogenic claudication: Secondary | ICD-10-CM | POA: Diagnosis not present

## 2018-09-05 DIAGNOSIS — M5136 Other intervertebral disc degeneration, lumbar region: Secondary | ICD-10-CM | POA: Diagnosis not present

## 2018-09-06 ENCOUNTER — Other Ambulatory Visit: Payer: Self-pay | Admitting: Internal Medicine

## 2018-09-06 DIAGNOSIS — Z1231 Encounter for screening mammogram for malignant neoplasm of breast: Secondary | ICD-10-CM

## 2018-09-18 DIAGNOSIS — M5416 Radiculopathy, lumbar region: Secondary | ICD-10-CM | POA: Diagnosis not present

## 2018-09-18 DIAGNOSIS — M9905 Segmental and somatic dysfunction of pelvic region: Secondary | ICD-10-CM | POA: Diagnosis not present

## 2018-09-18 DIAGNOSIS — M955 Acquired deformity of pelvis: Secondary | ICD-10-CM | POA: Diagnosis not present

## 2018-09-18 DIAGNOSIS — M9903 Segmental and somatic dysfunction of lumbar region: Secondary | ICD-10-CM | POA: Diagnosis not present

## 2018-09-19 ENCOUNTER — Other Ambulatory Visit: Payer: Self-pay

## 2018-09-19 ENCOUNTER — Encounter
Admission: RE | Admit: 2018-09-19 | Discharge: 2018-09-19 | Disposition: A | Discharge status: Home / Self Care | Payer: Medicare HMO | Source: Ambulatory Visit | Attending: Obstetrics and Gynecology | Admitting: Obstetrics and Gynecology

## 2018-09-19 DIAGNOSIS — Z01818 Encounter for other preprocedural examination: Secondary | ICD-10-CM | POA: Insufficient documentation

## 2018-09-19 DIAGNOSIS — R9389 Abnormal findings on diagnostic imaging of other specified body structures: Secondary | ICD-10-CM | POA: Diagnosis not present

## 2018-09-19 DIAGNOSIS — I10 Essential (primary) hypertension: Secondary | ICD-10-CM | POA: Diagnosis not present

## 2018-09-19 HISTORY — DX: Gastro-esophageal reflux disease without esophagitis: K21.9

## 2018-09-19 HISTORY — DX: Personal history of urinary calculi: Z87.442

## 2018-09-19 LAB — CBC
HCT: 40.5 % (ref 36.0–46.0)
Hemoglobin: 13.1 g/dL (ref 12.0–15.0)
MCH: 28.9 pg (ref 26.0–34.0)
MCHC: 32.3 g/dL (ref 30.0–36.0)
MCV: 89.4 fL (ref 80.0–100.0)
Platelets: 251 10*3/uL (ref 150–400)
RBC: 4.53 MIL/uL (ref 3.87–5.11)
RDW: 12.6 % (ref 11.5–15.5)
WBC: 7 10*3/uL (ref 4.0–10.5)
nRBC: 0 % (ref 0.0–0.2)

## 2018-09-19 LAB — TYPE AND SCREEN
ABO/RH(D): O POS
Antibody Screen: NEGATIVE

## 2018-09-19 LAB — BASIC METABOLIC PANEL
ANION GAP: 11 (ref 5–15)
BUN: 16 mg/dL (ref 8–23)
CALCIUM: 9.4 mg/dL (ref 8.9–10.3)
CO2: 26 mmol/L (ref 22–32)
CREATININE: 0.97 mg/dL (ref 0.44–1.00)
Chloride: 105 mmol/L (ref 98–111)
GFR calc Af Amer: >60 mL/min (ref >60)
GFR, EST NON AFRICAN AMERICAN: 56 mL/min — AB (ref >60)
Glucose, Bld: 114 mg/dL — ABNORMAL HIGH (ref 70–99)
Potassium: 4.1 mmol/L (ref 3.5–5.1)
Sodium: 142 mmol/L (ref 135–145)

## 2018-09-19 NOTE — H&P (Signed)
Dawn Vega is a 77 y.o. female here for Pre OP & Sign Consents .  HPI:  Pt presents for a preoperative visit to schedule a D&C, hysteroscopy, and myosure polypectomy.  She has a hx of: incidental finding of thickened endometrial stripe to 21.29mm on ultrasound, hx of 2cm fibroids. (CT scan)  Workup has included:  TVUS:   Uterus=8.11 x 3.34 x 4.65cm  Uterus anteverted  Fibroid seen: Lt lateral=2.5cm  Endometrium=21.52mm  Endometrial complex mass seen: 3.93 x 1.95 x 2.49cm- heterogenous with areas of hypodensity, appears to be a solid mass.  Office hysteroscopy and embx: endometrial polyp visualized, but EMBx without tissue on pathology. Possibly due to NS still in cavity.  Hx of back pain with epidural injections, plan to position while awake.   Past Medical History:  has a past medical history of GERD (gastroesophageal reflux disease), Hearing loss (04/30/2014), Hemorrhoids, Hyperlipidemia, Hypertension, Osteoarthrosis, Otosclerosis, Ovarian cyst, Personal history of kidney stones, Plantar fasciitis, and Tremor (04/30/2014).  Past Surgical History:  has a past surgical history that includes Oophorectomy (Left); Appendectomy; ear surgery (Left); Laparoscopic cholecystectomy (2015); Bunionectomy; Wisdom teeth; and Cholecystectomy. Family History: family history includes Breast cancer in an other family member; Colon cancer in an other family member; Colon polyps in her sister; Dementia in her mother; High blood pressure (Hypertension) in her mother and another family member; Lung cancer in her father; Mental illness in her father; Myocardial Infarction (Heart attack) in her mother and another family member; Prostate cancer in her father. Social History:  reports that she has never smoked. She has never used smokeless tobacco. She reports that she does not drink alcohol or use drugs. OB/GYN History:  OB History    Gravida  1   Para  1   Term  1   Preterm      AB      Living   1     SAB      TAB      Ectopic      Molar      Multiple      Live Births             Allergies: is allergic to ace inhibitors; crestor [rosuvastatin]; diclofenac potassium; latex; lipitor [atorvastatin]; pravastatin; and adhesive tape-silicones. Medications:  Current Outpatient Medications:  .  aspirin 81 MG EC tablet, Take 81 mg by mouth once daily., Disp: , Rfl:  .  chlorpheniramine (CHLOR-TRIMETON) 12 mg ER tablet, Take 4 mg by mouth every 12 (twelve) hours as needed Reported on 01/05/2016 , Disp: , Rfl:  .  cholecalciferol (CHOLECALCIFEROL) 1,000 unit tablet, Take by mouth 2 (two) times daily., Disp: , Rfl:  .  docosahexanoic acid/epa (FISH OIL ORAL), Take by mouth One by mouth 3 times A week, Disp: , Rfl:  .  ezetimibe (ZETIA) 10 mg tablet, Take 1 tablet (10 mg total) by mouth once daily, Disp: 30 tablet, Rfl: 11 .  losartan (COZAAR) 100 MG tablet, TAKE 1 TABLET EVERY DAY, Disp: 90 tablet, Rfl: 1 .  metoprolol succinate (TOPROL-XL) 50 MG XL tablet, TAKE 1 TABLET EVERY DAY, Disp: 90 tablet, Rfl: 3 .  MULTIVITAMIN ORAL, Take by mouth., Disp: , Rfl:  .  omega-3 acid ethyl esters (LOVAZA) 1 gram capsule, Take by mouth., Disp: , Rfl:  .  omeprazole (PRILOSEC) 20 MG DR capsule, TAKE 1 CAPSULE EVERY DAY, Disp: 90 capsule, Rfl: 3 .  vitamin E 400 UNIT capsule, Take 400 Units by mouth once daily., Disp: , Rfl:  Review of Systems: No SOB, no palpitations or chest pain, no new lower extremity edema, no nausea or vomiting or bowel or bladder complaints. See HPI for gyn specific ROS.   Exam:   Vitals:   09/19/18 1844  BP: 160/85    WDWN   female in NAD Body mass index is 34.93 kg/m.  General: Patient is well-groomed, well-nourished, appears stated age in no acute distress  HEENT: head is atraumatic and normocephalic, trachea is midline, neck is supple with no palpable nodules  CV: Regular rhythm and normal heart rate, no murmur  Pulm: Clear to auscultation  throughout lung fields with no wheezing, crackles, or rhonchi. No increased work of breathing  Abdomen: soft , no mass, non-tender, no rebound tenderness, no hepatomegaly  Pelvic: Deferred  Impression:   The encounter diagnosis was Thickened endometrium.    Plan:   -  Preoperative visit: D&C hysteroscopy, myosure. Consents signed today. Risks of surgery were discussed with the patient including but not limited to: bleeding which may require transfusion; infection which may require antibiotics; injury to uterus or surrounding organs; intrauterine scarring which may impair future fertility; need for additional procedures including laparotomy or laparoscopy; and other postoperative/anesthesia complications. Written informed consent was obtained.  This is a scheduled same-day surgery. She will have a postop visit in 2 weeks to review operative findings and pathology.  Review of BP revealed elevated BP on gyn visits, but normal BP in between. No cardiac clearance necessary  -  Return for Postop check.

## 2018-09-19 NOTE — Patient Instructions (Addendum)
Your procedure is scheduled on: Friday 09/28/18.  Report to DAY SURGERY DEPARTMENT LOCATED ON 2ND FLOOR MEDICAL MALL ENTRANCE. To find out your arrival time please call [phone] between 1PM - 3PM on Thursday 09/27/18.     Remember: Instructions that are not followed completely may result in serious medical risk, up to and including death, or upon the discretion of your surgeon and anesthesiologist your surgery may need to be rescheduled.     _X__ 1. Do not eat food after midnight the night before your procedure.                 No gum chewing or hard candies. You may drink clear liquids up to 2 hours                 before you are scheduled to arrive for your surgery- DO NOT drink clear                 liquids within 2 hours of the start of your surgery.                 Clear Liquids include:  water, apple juice without pulp, clear carbohydrate                 drink such as Clearfast or Gatorade, Black Coffee or Tea (Do not add                 anything to coffee or tea)   __X__2.  On the morning of surgery brush your teeth with toothpaste and water, you may rinse your mouth with mouthwash if you wish.  Do not swallow any toothpaste or mouthwash.   __X__ 3.  Notify your doctor if there is any change in your medical condition      (cold, fever, infections).     Do not wear jewelry, make-up, hairpins, clips or nail polish. Do not wear lotions, powders, or perfumes. You may wear deodorant. Do not shave 48 hours prior to surgery. Men may shave face and neck. Do not bring valuables to the hospital.     [facility] is not responsible for any belongings or valuables.    Contacts, dentures/partials or body piercings may not be worn into surgery. Bring a case for your contacts, glasses or hearing aids, a denture cup will be supplied.     Patients discharged the day of surgery will not be allowed to drive home.    Please read over the following fact sheets that you were given:    MRSA Information    __X__ Take these medicines the morning of surgery with A SIP OF WATER:     1. ezetimibe (ZETIA) 10 MG tablet  2. metoprolol succinate (TOPROL-XL) 50 MG 24 hr tablet  3. omeprazole (PRILOSEC) 20 MG capsule (take the night before and the morning of your surgery.)     __X__ Stop Blood Thinners Coumadin/Plavix/Xarelto/Pleta/Pradaxa/Eliquis/Effient/Aspirin. Last dose Friday 09/21/18.   __X__ Stop Anti-inflammatories 7 days before surgery such as Advil, Ibuprofen, Motrin, BC or Goodies Powder, Naprosyn, Naproxen, Aleve, Aspirin, Meloxicam. May take Tylenol if needed for pain or discomfort.    __X__ Stop the following herbal supplements on Friday 09/21/18: vitamin E 400 UNIT capsule, fish oil-omega-3 fatty acids 1000 MG capsule.

## 2018-09-28 ENCOUNTER — Encounter
Admission: RE | Disposition: A | Discharge status: Home / Self Care | Payer: Self-pay | Source: Ambulatory Visit | Attending: Obstetrics and Gynecology

## 2018-09-28 ENCOUNTER — Ambulatory Visit: Payer: Medicare HMO | Admitting: Certified Registered"

## 2018-09-28 ENCOUNTER — Ambulatory Visit
Admission: RE | Admit: 2018-09-28 | Discharge: 2018-09-28 | Disposition: A | Discharge status: Home / Self Care | Payer: Medicare HMO | Source: Ambulatory Visit | Attending: Obstetrics and Gynecology | Admitting: Obstetrics and Gynecology

## 2018-09-28 ENCOUNTER — Other Ambulatory Visit: Payer: Self-pay

## 2018-09-28 DIAGNOSIS — E785 Hyperlipidemia, unspecified: Secondary | ICD-10-CM | POA: Insufficient documentation

## 2018-09-28 DIAGNOSIS — N950 Postmenopausal bleeding: Secondary | ICD-10-CM | POA: Insufficient documentation

## 2018-09-28 DIAGNOSIS — Z79899 Other long term (current) drug therapy: Secondary | ICD-10-CM | POA: Insufficient documentation

## 2018-09-28 DIAGNOSIS — H9190 Unspecified hearing loss, unspecified ear: Secondary | ICD-10-CM | POA: Diagnosis not present

## 2018-09-28 DIAGNOSIS — D250 Submucous leiomyoma of uterus: Secondary | ICD-10-CM | POA: Diagnosis not present

## 2018-09-28 DIAGNOSIS — K219 Gastro-esophageal reflux disease without esophagitis: Secondary | ICD-10-CM | POA: Diagnosis not present

## 2018-09-28 DIAGNOSIS — Z7982 Long term (current) use of aspirin: Secondary | ICD-10-CM | POA: Insufficient documentation

## 2018-09-28 DIAGNOSIS — N840 Polyp of corpus uteri: Secondary | ICD-10-CM | POA: Insufficient documentation

## 2018-09-28 DIAGNOSIS — I10 Essential (primary) hypertension: Secondary | ICD-10-CM | POA: Insufficient documentation

## 2018-09-28 HISTORY — PX: DILATATION & CURETTAGE/HYSTEROSCOPY WITH MYOSURE: SHX6511

## 2018-09-28 LAB — ABO/RH: ABO/RH(D): O POS

## 2018-09-28 SURGERY — DILATATION & CURETTAGE/HYSTEROSCOPY WITH MYOSURE
Anesthesia: General | Site: Cervix

## 2018-09-28 MED ORDER — ONDANSETRON HCL 4 MG/2ML IJ SOLN
INTRAMUSCULAR | Status: DC | PRN
  Administered 2018-09-28: 4 mg via INTRAVENOUS

## 2018-09-28 MED ORDER — FENTANYL CITRATE (PF) 100 MCG/2ML IJ SOLN
INTRAMUSCULAR | Status: DC | PRN
  Administered 2018-09-28 (×4): 25 ug via INTRAVENOUS

## 2018-09-28 MED ORDER — FENTANYL CITRATE (PF) 100 MCG/2ML IJ SOLN
INTRAMUSCULAR | Status: AC
  Filled 2018-09-28: qty 2

## 2018-09-28 MED ORDER — MIDAZOLAM HCL 2 MG/2ML IJ SOLN
INTRAMUSCULAR | Status: DC | PRN
  Administered 2018-09-28: 2 mg via INTRAVENOUS

## 2018-09-28 MED ORDER — PROPOFOL 500 MG/50ML IV EMUL
INTRAVENOUS | Status: DC | PRN
  Administered 2018-09-28: 80 ug/kg/min via INTRAVENOUS

## 2018-09-28 MED ORDER — LIDOCAINE-EPINEPHRINE 1 %-1:100000 IJ SOLN
INTRAMUSCULAR | Status: DC | PRN
  Administered 2018-09-28: 10 mL

## 2018-09-28 MED ORDER — PROPOFOL 10 MG/ML IV BOLUS
INTRAVENOUS | Status: AC
  Filled 2018-09-28: qty 20

## 2018-09-28 MED ORDER — ESTROGENS, CONJUGATED 0.625 MG/GM VA CREA
TOPICAL_CREAM | VAGINAL | Status: AC
  Filled 2018-09-28: qty 30

## 2018-09-28 MED ORDER — LIDOCAINE HCL (PF) 2 % IJ SOLN
INTRAMUSCULAR | Status: AC
  Filled 2018-09-28: qty 10

## 2018-09-28 MED ORDER — DEXAMETHASONE SODIUM PHOSPHATE 10 MG/ML IJ SOLN
INTRAMUSCULAR | Status: DC | PRN
  Administered 2018-09-28: 10 mg via INTRAVENOUS

## 2018-09-28 MED ORDER — ESTROGENS, CONJUGATED 0.625 MG/GM VA CREA
TOPICAL_CREAM | VAGINAL | Status: DC | PRN
  Administered 2018-09-28: 1 via VAGINAL

## 2018-09-28 MED ORDER — ONDANSETRON HCL 4 MG/2ML IJ SOLN
INTRAMUSCULAR | Status: AC
  Filled 2018-09-28: qty 2

## 2018-09-28 MED ORDER — DEXAMETHASONE SODIUM PHOSPHATE 10 MG/ML IJ SOLN
INTRAMUSCULAR | Status: AC
  Filled 2018-09-28: qty 1

## 2018-09-28 MED ORDER — LIDOCAINE-EPINEPHRINE 1 %-1:100000 IJ SOLN
INTRAMUSCULAR | Status: AC
  Filled 2018-09-28: qty 1

## 2018-09-28 MED ORDER — MIDAZOLAM HCL 2 MG/2ML IJ SOLN
INTRAMUSCULAR | Status: AC
  Filled 2018-09-28: qty 2

## 2018-09-28 MED ORDER — SODIUM CHLORIDE 0.9 % IV SOLN
INTRAVENOUS | Status: DC
  Administered 2018-09-28: 15:00:00 via INTRAVENOUS

## 2018-09-28 SURGICAL SUPPLY — 19 items
CANISTER SUC SOCK COL 7IN (MISCELLANEOUS) ×2 IMPLANT
CANISTER SUCT 3000ML PPV (MISCELLANEOUS) ×2 IMPLANT
CATH ROBINSON RED A/P 16FR (CATHETERS) IMPLANT
COVER WAND RF STERILE (DRAPES) ×2 IMPLANT
DEVICE MYOSURE LITE (MISCELLANEOUS) IMPLANT
DEVICE MYOSURE REACH (MISCELLANEOUS) ×2 IMPLANT
GLOVE BIO SURGEON STRL SZ7 (GLOVE) ×2 IMPLANT
GLOVE INDICATOR 7.5 STRL GRN (GLOVE) ×2 IMPLANT
GOWN STRL REUS W/ TWL LRG LVL3 (GOWN DISPOSABLE) ×2 IMPLANT
GOWN STRL REUS W/TWL LRG LVL3 (GOWN DISPOSABLE) ×2
KIT PROCEDURE FLUENT (KITS) ×2 IMPLANT
KIT TURNOVER CYSTO (KITS) ×2 IMPLANT
PACK DNC HYST (MISCELLANEOUS) ×2 IMPLANT
PAD OB MATERNITY 4.3X12.25 (PERSONAL CARE ITEMS) ×2 IMPLANT
PAD PREP 24X41 OB/GYN DISP (PERSONAL CARE ITEMS) ×2 IMPLANT
SOL .9 NS 3000ML IRR  AL (IV SOLUTION) ×1
SOL .9 NS 3000ML IRR UROMATIC (IV SOLUTION) ×1 IMPLANT
TUBING CONNECTING 10 (TUBING) ×2 IMPLANT
TUBING HYSTEROSCOPY DOLPHIN (MISCELLANEOUS) ×2 IMPLANT

## 2018-09-28 NOTE — Op Note (Signed)
Operative Report Hysteroscopy with Dilation and Curettage   Indications: Postmenopausal bleeding   Pre-operative Diagnosis: Endometrial submucosal fibroid   Post-operative Diagnosis: same.  Procedure: 1. Exam under anesthesia 2. Fractional D&C 3. Hysteroscopy 4. Myosure polypectomy  Surgeon: Benjaman Kindler, MD  Assistant(s):  None  Anesthesia: Monitored Local Anesthesia with Sedation; local cervical block with 10cc of 1% lidocaine with epi  Anesthesiologist: Gunnar Bulla, MD Anesthesiologist: Gunnar Bulla, MD CRNA: Lavone Orn, CRNA  Estimated Blood Loss:  Minimal         Intraoperative medications: n/a         Total IV Fluids: 526ml  Urine Output: 18ml  Total Fluid Deficit:  0 mL          Specimens: Endocervical curettings, endometrial curettings; fibroid myosure curettings         Complications:  None; patient tolerated the procedure well.         Disposition: PACU - hemodynamically stable.         Condition: stable  Findings: Uterus measuring 8 cm by sound; normal cervix, vagina, perineum. Large anterior fundal fibroid  Indication for procedure/Consents: 77 y.o. G1P1  here for scheduled surgery for thickened endometrial stripe, postmenopausal bleeding and suspected endometrial mass.   Risks of surgery were discussed with the patient including but not limited to: bleeding which may require transfusion; infection which may require antibiotics; injury to uterus or surrounding organs; intrauterine scarring which may impair future fertility; need for additional procedures including laparotomy or laparoscopy; and other postoperative/anesthesia complications. Written informed consent was obtained.      D&C/ Myosure  The patient was taken to the operating room where anesthesia was administered and was found to be adequate. After a formal and adequate timeout was performed, she was placed in the dorsal lithotomy position and examined with the above  findings. She was then prepped and draped in the sterile manner. Her bladder was catheterized for an estimated amount of clear, yellow urine. A weighed speculum was then placed in the patient's vagina and a single tooth tenaculum was applied to the anterior lip of the cervix.  Her cervix was serially dilated to 15 [place] using Hanks dilators. An ECC was performed. The hysteroscope was introduced under direct observation  Using lactated ringers as a distention medium to reveal the above findings. The uterine cavity was carefully examined, both ostia were recognized, and diffusely proliferative endometrium with the fibroid above were noted.   This was resected using the Myosure device.  After further careful visualization of the uterine cavity, the hysteroscope was removed under direct visualization.  A sharp curettage was then performed until there was a gritty texture in all four quadrants. The tenaculum was removed from the anterior lip of the cervix and the vaginal speculum was removed after applying silver nitrate for good hemostasis.   The patient tolerated the procedure well and was taken to the recovery area awake and in stable condition. She received iv acetaminophen and Toradol prior to leaving the OR.  The patient will be discharged to home as per PACU criteria. Routine postoperative instructions given. She was prescribed Ibuprofen and Colace. She will follow up in the clinic in two weeks for postoperative evaluation.

## 2018-09-28 NOTE — Transfer of Care (Signed)
Immediate Anesthesia Transfer of Care Note  Patient: Dawn Vega  Procedure(s) Performed: Sandwich, polypectomy (N/A Cervix)  Patient Location: PACU  Anesthesia Type:General  Level of Consciousness: drowsy  Airway & Oxygen Therapy: Patient Spontanous Breathing and Patient connected to face mask oxygen  Post-op Assessment: Report given to RN and Post -op Vital signs reviewed and stable  Post vital signs: stable  Last Vitals:  Vitals Value Taken Time  BP    Temp    Pulse    Resp    SpO2      Last Pain:  Vitals:   09/28/18 1433  TempSrc: Temporal  PainSc: 0-No pain         Complications: No apparent anesthesia complications

## 2018-09-28 NOTE — Anesthesia Preprocedure Evaluation (Signed)
Anesthesia Evaluation  Patient identified by MRN, date of birth, ID band Patient awake    Reviewed: Allergy & Precautions, H&P , NPO status , Patient's Chart, lab work & pertinent test results, reviewed documented beta blocker date and time   Airway Mallampati: II   Neck ROM: full    Dental  (+) Poor Dentition   Pulmonary neg pulmonary ROS,    Pulmonary exam normal        Cardiovascular Exercise Tolerance: Poor hypertension, On Medications negative cardio ROS Normal cardiovascular exam Rhythm:regular Rate:Normal     Neuro/Psych negative neurological ROS  negative psych ROS   GI/Hepatic Neg liver ROS, GERD  Medicated,  Endo/Other  negative endocrine ROS  Renal/GU Renal disease  negative genitourinary   Musculoskeletal   Abdominal   Peds  Hematology negative hematology ROS (+)   Anesthesia Other Findings Past Medical History: No date: GERD (gastroesophageal reflux disease) No date: History of kidney stones No date: Hyperlipidemia No date: Hypertension 2014: Kidney stones Past Surgical History: 1963: APPENDECTOMY 2010: COLONOSCOPY     Comment:  Dr. Candace Cruise No date: EXTERNAL EAR SURGERY; Left     Comment:  1970's 1970: FOOT SURGERY; Left 1963: OVARY SURGERY     Comment:  due to cyst rupture No date: WISDOM TOOTH EXTRACTION BMI    Body Mass Index:  33.84 kg/m     Reproductive/Obstetrics negative OB ROS                             Anesthesia Physical Anesthesia Plan  ASA: III  Anesthesia Plan: General   Post-op Pain Management:    Induction:   PONV Risk Score and Plan:   Airway Management Planned:   Additional Equipment:   Intra-op Plan:   Post-operative Plan:   Informed Consent: I have reviewed the patients History and Physical, chart, labs and discussed the procedure including the risks, benefits and alternatives for the proposed anesthesia with the patient or  authorized representative who has indicated his/her understanding and acceptance.   Dental Advisory Given  Plan Discussed with: CRNA  Anesthesia Plan Comments:         Anesthesia Quick Evaluation

## 2018-09-28 NOTE — Discharge Instructions (Signed)
Discharge instructions after a hysteroscopy with dilation and curettage  Signs and Symptoms to Report  For the next two weeks, placed a pea-sized amount of estrogen cream vaginally (or use the applicator) to help with healing.  Call our office at [phone] if you have any of the following:    Fever over 100.4 degrees or higher  Severe stomach pain not relieved with pain medications  Bright red bleeding thats heavier than a period that does not slow with rest after the first 24 hours  To go the bathroom a lot (frequency), you cant hold your urine (urgency), or it hurts when you empty your bladder (urinate)  Chest pain  Shortness of breath  Pain in the calves of your legs  Severe nausea and vomiting not relieved with anti-nausea medications  Any concerns  What You Can Expect after Surgery  You may see some pink tinged, bloody fluid. This is normal. You may also have cramping for several days.   Activities after Your Discharge Follow these guidelines to help speed your recovery at home:  Dont drive if you are in pain or taking narcotic pain medicine. You may drive when you can safely slam on the brakes, turn the wheel forcefully, and rotate your torso comfortably. This is typically 4-7 days. Practice in a parking lot or side street prior to attempting to drive regularly.   Ask others to help with household chores for 4 weeks.  Dont do strenuous activities, exercises, or sports like vacuuming, tennis, squash, etc. until your doctor says it is safe to do so.  Walk as you feel able. Rest often since it may take a week or two for your energy level to return to normal.   You may climb stairs  Avoid constipation:   -Eat fruits, vegetables, and whole grains. Eat small meals as your appetite will take time to return to normal.   -Drink 6 to 8 glasses of water each day unless your doctor has told you to limit your fluids.   -Use a laxative or stool softener as needed if  constipation becomes a problem. You may take Miralax, metamucil, Citrucil, Colace, Senekot, FiberCon, etc. If this does not relieve the constipation, try two tablespoons of Milk Of Magnesia every 8 hours until your bowels move.   You may shower.   Do not get in a hot tub, swimming pool, etc. until your doctor agrees.  Do not douche, use tampons, or have sex until your doctor says it is okay, usually about 2 weeks.  Take your pain medicine when you need it. The medicine may not work as well if the pain is bad.  Take the medicines you were taking before surgery. Other medications you might need are pain medications (ibuprofen), medications for constipation (Colace) and nausea medications (Zofran).    AMBULATORY SURGERY  DISCHARGE INSTRUCTIONS   1) The drugs that you were given will stay in your system until tomorrow so for the next 24 hours you should not:  A) Drive an automobile B) Make any legal decisions C) Drink any alcoholic beverage   2) You may resume regular meals tomorrow.  Today it is better to start with liquids and gradually work up to solid foods.  You may eat anything you prefer, but it is better to start with liquids, then soup and crackers, and gradually work up to solid foods.   3) Please notify your doctor immediately if you have any unusual bleeding, trouble breathing, redness and pain at the  surgery site, drainage, fever, or pain not relieved by medication.    4) Additional Instructions:        Please contact your physician with any problems or Same Day Surgery at [phone], Monday through Friday 6 am to 4 pm, or [place] [facility] number at [phone].

## 2018-09-28 NOTE — Anesthesia Post-op Follow-up Note (Signed)
Anesthesia QCDR form completed.        

## 2018-09-28 NOTE — Interval H&P Note (Signed)
History and Physical Interval Note:  09/28/2018 2:49 PM  Dawn Vega  has presented today for surgery, with the diagnosis of postmenopausal bleeding  The various methods of treatment have been discussed with the patient and family. After consideration of risks, benefits and other options for treatment, the patient has consented to  Procedure(s): [place], polypectomy (N/A) as a surgical intervention .   The patient's history has been reviewed, patient examined, lungs are clear, stable for surgery.  She has had an upper respiratory cough but no fever, and we will perform with iv sedation and paracervical block.  I have reviewed the patient's chart and labs.  Questions were answered to the patient's satisfaction.     Benjaman Kindler

## 2018-09-28 NOTE — Anesthesia Postprocedure Evaluation (Signed)
Anesthesia Post Note  Patient: Dawn Vega  Procedure(s) Performed: [place], polypectomy (N/A [place])  Patient location during evaluation: PACU Anesthesia Type: General Level of consciousness: awake and alert Pain management: pain level controlled Vital Signs Assessment: post-procedure vital signs reviewed and stable Respiratory status: spontaneous breathing, nonlabored ventilation, respiratory function stable and patient connected to nasal cannula oxygen Cardiovascular status: blood pressure returned to baseline and stable Postop Assessment: no apparent nausea or vomiting Anesthetic complications: no     Last Vitals:  Vitals:   09/28/18 1700 09/28/18 1729  BP: (!) 155/73 (!) 167/76  Pulse: 93 87  Resp: 19 18  Temp:  36.8 C  SpO2: 99% 96%    Last Pain:  Vitals:   09/28/18 1729  TempSrc: Oral  PainSc: 0-No pain                 Philippa Vessey S

## 2018-09-29 ENCOUNTER — Encounter: Payer: Self-pay | Admitting: Obstetrics and Gynecology

## 2018-10-02 LAB — SURGICAL PATHOLOGY

## 2018-10-03 DIAGNOSIS — M5136 Other intervertebral disc degeneration, lumbar region: Secondary | ICD-10-CM | POA: Diagnosis not present

## 2018-10-03 DIAGNOSIS — M5416 Radiculopathy, lumbar region: Secondary | ICD-10-CM | POA: Diagnosis not present

## 2018-10-03 DIAGNOSIS — M48062 Spinal stenosis, lumbar region with neurogenic claudication: Secondary | ICD-10-CM | POA: Diagnosis not present

## 2018-10-12 ENCOUNTER — Ambulatory Visit
Admission: RE | Admit: 2018-10-12 | Discharge: 2018-10-12 | Disposition: A | Discharge status: Home / Self Care | Payer: Medicare HMO | Source: Ambulatory Visit | Attending: Internal Medicine | Admitting: Internal Medicine

## 2018-10-12 DIAGNOSIS — Z1231 Encounter for screening mammogram for malignant neoplasm of breast: Secondary | ICD-10-CM | POA: Diagnosis not present

## 2018-10-16 DIAGNOSIS — E785 Hyperlipidemia, unspecified: Secondary | ICD-10-CM | POA: Diagnosis not present

## 2018-12-03 DIAGNOSIS — M9905 Segmental and somatic dysfunction of pelvic region: Secondary | ICD-10-CM | POA: Diagnosis not present

## 2018-12-03 DIAGNOSIS — M4316 Spondylolisthesis, lumbar region: Secondary | ICD-10-CM | POA: Diagnosis not present

## 2018-12-03 DIAGNOSIS — M5136 Other intervertebral disc degeneration, lumbar region: Secondary | ICD-10-CM | POA: Diagnosis not present

## 2018-12-03 DIAGNOSIS — M9903 Segmental and somatic dysfunction of lumbar region: Secondary | ICD-10-CM | POA: Diagnosis not present

## 2018-12-17 DIAGNOSIS — M545 Low back pain: Secondary | ICD-10-CM | POA: Diagnosis not present

## 2018-12-17 DIAGNOSIS — M25552 Pain in left hip: Secondary | ICD-10-CM | POA: Diagnosis not present

## 2018-12-17 DIAGNOSIS — M1612 Unilateral primary osteoarthritis, left hip: Secondary | ICD-10-CM | POA: Diagnosis not present

## 2019-01-16 DIAGNOSIS — I639 Cerebral infarction, unspecified: Secondary | ICD-10-CM

## 2019-01-16 DIAGNOSIS — G459 Transient cerebral ischemic attack, unspecified: Secondary | ICD-10-CM

## 2019-01-16 HISTORY — DX: Transient cerebral ischemic attack, unspecified: G45.9

## 2019-01-16 HISTORY — DX: Cerebral infarction, unspecified: I63.9

## 2019-02-04 DIAGNOSIS — K219 Gastro-esophageal reflux disease without esophagitis: Secondary | ICD-10-CM | POA: Diagnosis not present

## 2019-02-04 DIAGNOSIS — R739 Hyperglycemia, unspecified: Secondary | ICD-10-CM | POA: Diagnosis not present

## 2019-02-04 DIAGNOSIS — E7849 Other hyperlipidemia: Secondary | ICD-10-CM | POA: Diagnosis not present

## 2019-02-04 DIAGNOSIS — I10 Essential (primary) hypertension: Secondary | ICD-10-CM | POA: Diagnosis not present

## 2019-02-04 DIAGNOSIS — M5136 Other intervertebral disc degeneration, lumbar region: Secondary | ICD-10-CM | POA: Diagnosis not present

## 2019-02-04 DIAGNOSIS — I739 Peripheral vascular disease, unspecified: Secondary | ICD-10-CM | POA: Diagnosis not present

## 2019-02-04 DIAGNOSIS — M150 Primary generalized (osteo)arthritis: Secondary | ICD-10-CM | POA: Diagnosis not present

## 2019-03-13 ENCOUNTER — Emergency Department: Payer: Medicare HMO

## 2019-03-13 ENCOUNTER — Observation Stay
Admission: EM | Admit: 2019-03-13 | Discharge: 2019-03-14 | Disposition: A | Discharge status: Home / Self Care | Payer: Medicare HMO | Attending: Family Medicine | Admitting: Family Medicine

## 2019-03-13 ENCOUNTER — Observation Stay: Payer: Medicare HMO

## 2019-03-13 ENCOUNTER — Other Ambulatory Visit: Payer: Self-pay

## 2019-03-13 DIAGNOSIS — K219 Gastro-esophageal reflux disease without esophagitis: Secondary | ICD-10-CM | POA: Diagnosis not present

## 2019-03-13 DIAGNOSIS — R29898 Other symptoms and signs involving the musculoskeletal system: Secondary | ICD-10-CM | POA: Insufficient documentation

## 2019-03-13 DIAGNOSIS — Z79899 Other long term (current) drug therapy: Secondary | ICD-10-CM | POA: Diagnosis not present

## 2019-03-13 DIAGNOSIS — I10 Essential (primary) hypertension: Secondary | ICD-10-CM | POA: Insufficient documentation

## 2019-03-13 DIAGNOSIS — Z7982 Long term (current) use of aspirin: Secondary | ICD-10-CM | POA: Diagnosis not present

## 2019-03-13 DIAGNOSIS — Z1159 Encounter for screening for other viral diseases: Secondary | ICD-10-CM | POA: Diagnosis not present

## 2019-03-13 DIAGNOSIS — E785 Hyperlipidemia, unspecified: Secondary | ICD-10-CM | POA: Insufficient documentation

## 2019-03-13 DIAGNOSIS — G459 Transient cerebral ischemic attack, unspecified: Secondary | ICD-10-CM | POA: Diagnosis not present

## 2019-03-13 DIAGNOSIS — Z03818 Encounter for observation for suspected exposure to other biological agents ruled out: Secondary | ICD-10-CM | POA: Diagnosis not present

## 2019-03-13 DIAGNOSIS — Z87442 Personal history of urinary calculi: Secondary | ICD-10-CM | POA: Insufficient documentation

## 2019-03-13 DIAGNOSIS — R29818 Other symptoms and signs involving the nervous system: Secondary | ICD-10-CM | POA: Diagnosis not present

## 2019-03-13 LAB — PROTIME-INR
INR: 1 (ref 0.8–1.2)
Prothrombin Time: 13 seconds (ref 11.4–15.2)

## 2019-03-13 LAB — COMPREHENSIVE METABOLIC PANEL
ALT: 20 U/L (ref 0–44)
AST: 25 U/L (ref 15–41)
Albumin: 4.6 g/dL (ref 3.5–5.0)
Alkaline Phosphatase: 66 U/L (ref 38–126)
Anion gap: 11 (ref 5–15)
BUN: 17 mg/dL (ref 8–23)
CO2: 26 mmol/L (ref 22–32)
Calcium: 9.6 mg/dL (ref 8.9–10.3)
Chloride: 103 mmol/L (ref 98–111)
Creatinine, Ser: 1.11 mg/dL — ABNORMAL HIGH (ref 0.44–1.00)
GFR calc Af Amer: 55 mL/min — ABNORMAL LOW (ref >60)
GFR calc non Af Amer: 48 mL/min — ABNORMAL LOW (ref >60)
Glucose, Bld: 108 mg/dL — ABNORMAL HIGH (ref 70–99)
Potassium: 4.8 mmol/L (ref 3.5–5.1)
Sodium: 140 mmol/L (ref 135–145)
Total Bilirubin: 0.2 mg/dL — ABNORMAL LOW (ref 0.3–1.2)
Total Protein: 7.6 g/dL (ref 6.5–8.1)

## 2019-03-13 LAB — CBC
HCT: 41.9 % (ref 36.0–46.0)
Hemoglobin: 13.9 g/dL (ref 12.0–15.0)
MCH: 28.5 pg (ref 26.0–34.0)
MCHC: 33.2 g/dL (ref 30.0–36.0)
MCV: 86 fL (ref 80.0–100.0)
Platelets: 251 10*3/uL (ref 150–400)
RBC: 4.87 MIL/uL (ref 3.87–5.11)
RDW: 13 % (ref 11.5–15.5)
WBC: 8.2 10*3/uL (ref 4.0–10.5)
nRBC: 0 % (ref 0.0–0.2)

## 2019-03-13 LAB — DIFFERENTIAL
Abs Immature Granulocytes: 0.02 10*3/uL (ref 0.00–0.07)
Basophils Absolute: 0.1 10*3/uL (ref 0.0–0.1)
Basophils Relative: 1 %
Eosinophils Absolute: 0.5 10*3/uL (ref 0.0–0.5)
Eosinophils Relative: 6 %
Immature Granulocytes: 0 %
Lymphocytes Relative: 27 %
Lymphs Abs: 2.2 10*3/uL (ref 0.7–4.0)
Monocytes Absolute: 0.7 10*3/uL (ref 0.1–1.0)
Monocytes Relative: 9 %
Neutro Abs: 4.7 10*3/uL (ref 1.7–7.7)
Neutrophils Relative %: 57 %

## 2019-03-13 LAB — LIPID PANEL
Cholesterol: 266 mg/dL — ABNORMAL HIGH (ref 0–200)
HDL: 49 mg/dL (ref >40)
LDL Cholesterol: 143 mg/dL — ABNORMAL HIGH (ref 0–99)
Total CHOL/HDL Ratio: 5.4 RATIO
Triglycerides: 370 mg/dL — ABNORMAL HIGH (ref <150)
VLDL: 74 mg/dL — ABNORMAL HIGH (ref 0–40)

## 2019-03-13 LAB — APTT: aPTT: 31 seconds (ref 24–36)

## 2019-03-13 LAB — SARS CORONAVIRUS 2 BY RT PCR (HOSPITAL ORDER, PERFORMED IN ~~LOC~~ HOSPITAL LAB): SARS Coronavirus 2: NEGATIVE

## 2019-03-13 MED ORDER — ACETAMINOPHEN 500 MG PO TABS: 1000.0000 mg | ORAL_TABLET | Freq: Four times a day (QID) | ORAL | Status: DC | PRN

## 2019-03-13 MED ORDER — EZETIMIBE 10 MG PO TABS
10.0000 mg | ORAL_TABLET | Freq: Every day | ORAL | Status: DC
  Filled 2019-03-13 (×2): qty 1

## 2019-03-13 MED ORDER — VITAMIN D 25 MCG (1000 UNIT) PO TABS
1000.0000 [IU] | ORAL_TABLET | Freq: Two times a day (BID) | ORAL | Status: DC
  Administered 2019-03-14: 1000 [IU] via ORAL
  Filled 2019-03-13: qty 1

## 2019-03-13 MED ORDER — ACETAMINOPHEN 325 MG PO TABS: 650.0000 mg | ORAL_TABLET | ORAL | Status: DC | PRN

## 2019-03-13 MED ORDER — LOSARTAN POTASSIUM 50 MG PO TABS
100.0000 mg | ORAL_TABLET | Freq: Every day | ORAL | Status: DC
  Administered 2019-03-14: 100 mg via ORAL
  Filled 2019-03-13: qty 2

## 2019-03-13 MED ORDER — SODIUM CHLORIDE 0.9% FLUSH
3.0000 mL | Freq: Once | INTRAVENOUS | Status: AC
  Administered 2019-03-13: 3 mL via INTRAVENOUS

## 2019-03-13 MED ORDER — HEPARIN SODIUM (PORCINE) 5000 UNIT/ML IJ SOLN
5000.0000 [IU] | Freq: Three times a day (TID) | INTRAMUSCULAR | Status: DC
  Administered 2019-03-13 – 2019-03-14 (×2): 5000 [IU] via SUBCUTANEOUS
  Filled 2019-03-13 (×2): qty 1

## 2019-03-13 MED ORDER — ACETAMINOPHEN 160 MG/5ML PO SOLN: 650.0000 mg | ORAL | Status: DC | PRN

## 2019-03-13 MED ORDER — METOPROLOL SUCCINATE ER 50 MG PO TB24
100.0000 mg | ORAL_TABLET | Freq: Every day | ORAL | Status: DC
  Administered 2019-03-14: 100 mg via ORAL
  Filled 2019-03-13: qty 2

## 2019-03-13 MED ORDER — ADULT MULTIVITAMIN W/MINERALS CH
1.0000 | ORAL_TABLET | Freq: Every day | ORAL | Status: DC
  Administered 2019-03-14: 1 via ORAL
  Filled 2019-03-13: qty 1

## 2019-03-13 MED ORDER — VITAMIN E 180 MG (400 UNIT) PO CAPS
400.0000 [IU] | ORAL_CAPSULE | Freq: Every day | ORAL | Status: DC
  Administered 2019-03-14: 400 [IU] via ORAL
  Filled 2019-03-13 (×2): qty 1

## 2019-03-13 MED ORDER — PANTOPRAZOLE SODIUM 40 MG PO TBEC
40.0000 mg | DELAYED_RELEASE_TABLET | Freq: Every day | ORAL | Status: DC
  Administered 2019-03-14: 40 mg via ORAL
  Filled 2019-03-13: qty 1

## 2019-03-13 MED ORDER — ACETAMINOPHEN 650 MG RE SUPP: 650.0000 mg | RECTAL | Status: DC | PRN

## 2019-03-13 MED ORDER — STROKE: EARLY STAGES OF RECOVERY BOOK
Freq: Once | Status: AC
  Administered 2019-03-13: 21:00:00

## 2019-03-13 MED ORDER — ASPIRIN EC 81 MG PO TBEC
81.0000 mg | DELAYED_RELEASE_TABLET | Freq: Every day | ORAL | Status: DC
  Administered 2019-03-14: 10:00:00 81 mg via ORAL
  Filled 2019-03-13: qty 1

## 2019-03-13 MED ORDER — OMEGA-3-ACID ETHYL ESTERS 1 G PO CAPS
1.0000 | ORAL_CAPSULE | Freq: Every day | ORAL | Status: DC
  Administered 2019-03-14: 10:00:00 1 g via ORAL
  Filled 2019-03-13: qty 1

## 2019-03-13 MED ORDER — SENNOSIDES-DOCUSATE SODIUM 8.6-50 MG PO TABS: 1.0000 | ORAL_TABLET | Freq: Every evening | ORAL | Status: DC | PRN

## 2019-03-13 NOTE — H&P (Signed)
[facility] [place] NAME: Dawn Vega    MR#:  [id number]  DATE OF BIRTH:  September 09, 1941  DATE OF ADMISSION:  03/13/2019  PRIMARY CARE PHYSICIAN: Adin Hector, MD   REQUESTING/REFERRING PHYSICIAN: Cinda Quest  CHIEF COMPLAINT: Speech problem  HISTORY OF PRESENT ILLNESS: Dawn Vega  is a 78 y.o. female with a known history of gastroesophageal reflux disease, kidney stone, hyperlipidemia, hypertension-was with her granddaughter at [place] for breakfast, while talking to her she could not come up with proper words and had " word salad" these episodes lasted for 4- 5 minutes.  She denies any associated symptoms of headache tingling numbness or weakness.  She was completely fine after the symptoms were gone.  Her family was worried because of the symptoms but as she recovered she continued regular works.  She went for doctor's appointment to have injection in her hip for the pain. In the clinic they noted her blood pressure to be very high, and concerned with this complaints in the morning of having difficulty speaking-they advised to go to emergency room to have work-up for stroke. Patient was completely asymptomatic during my visit.  PAST MEDICAL HISTORY:   Past Medical History:  Diagnosis Date  . GERD (gastroesophageal reflux disease)   . History of kidney stones   . Hyperlipidemia   . Hypertension   . Kidney stones 2014    PAST SURGICAL HISTORY:  Past Surgical History:  Procedure Laterality Date  . APPENDECTOMY  1963  . COLONOSCOPY  2010   Dr. Candace Cruise  . DILATATION & CURETTAGE/HYSTEROSCOPY WITH MYOSURE N/A 09/28/2018   Procedure: DILATATION & CURETTAGE/HYSTEROSCOPY WITH MYOSURE, polypectomy;  Surgeon: Benjaman Kindler, MD;  Location: [facility] ORS;  Service: Gynecology;  Laterality: N/A;  . EXTERNAL EAR SURGERY Left    1970's  . FOOT SURGERY Left 1970  . [place]   due to cyst rupture  . WISDOM TOOTH EXTRACTION      SOCIAL  HISTORY:  Social History   Tobacco Use  . Smoking status: Never Smoker  . Smokeless tobacco: Never Used  Substance Use Topics  . Alcohol use: No    FAMILY HISTORY:  Family History  Problem Relation Age of Onset  . Cancer Paternal Aunt        breast   . Breast cancer Paternal Aunt   . Cancer Paternal Aunt        colon    DRUG ALLERGIES:  Allergies  Allergen Reactions  . Ace Inhibitors     cough  . Calcium     Pt states she can not take  . Crestor [Rosuvastatin Calcium]     Muscle pain  . Latex     Dermatitis  . Lipitor [Atorvastatin Calcium]     unknown  . Potassium-Containing Compounds     Pt does not remember reaction or intolerance   . Pravastatin     Muscle pain  . Tape Rash    Silicone tape    REVIEW OF SYSTEMS:   CONSTITUTIONAL: No fever, fatigue or weakness.  EYES: No blurred or double vision.  EARS, NOSE, AND THROAT: No tinnitus or ear pain.  RESPIRATORY: No cough, shortness of breath, wheezing or hemoptysis.  CARDIOVASCULAR: No chest pain, orthopnea, edema.  GASTROINTESTINAL: No nausea, vomiting, diarrhea or abdominal pain.  GENITOURINARY: No dysuria, hematuria.  ENDOCRINE: No polyuria, nocturia,  HEMATOLOGY: No anemia, easy bruising or bleeding SKIN: No rash or lesion. MUSCULOSKELETAL: No joint pain or  arthritis.   NEUROLOGIC: No tingling, numbness, weakness.  PSYCHIATRY: No anxiety or depression.   MEDICATIONS AT HOME:  Prior to Admission medications   Medication Sig Start Date End Date Taking? Authorizing Provider  acetaminophen (TYLENOL) 500 MG tablet Take 1,000 mg by mouth every 6 (six) hours as needed.    Yes [provider]  aspirin 81 MG tablet Take 81 mg by mouth daily.   Yes [provider]  Cholecalciferol 25 MCG (1000 UT) tablet Take 1,000 Units by mouth 2 (two) times daily.    Yes [provider]  ezetimibe (ZETIA) 10 MG tablet Take 10 mg by mouth daily.   Yes [provider]  fish oil-omega-3  fatty acids 1000 MG capsule Take 1 capsule by mouth daily.    Yes [provider]  losartan (COZAAR) 100 MG tablet Take 1 tablet by mouth daily. 04/24/13  Yes [provider]  metoprolol succinate (TOPROL-XL) 100 MG 24 hr tablet Take 100 mg by mouth daily. 02/11/19 02/11/20 Yes [provider]  Multiple Vitamins-Minerals (MULTIVITAMIN WITH MINERALS) tablet Take 1 tablet by mouth daily.   Yes [provider]  omeprazole (PRILOSEC) 20 MG capsule Take 1 capsule by mouth daily.  04/24/13  Yes [provider]  vitamin E 400 UNIT capsule Take 400 Units by mouth daily.   Yes [provider]      PHYSICAL EXAMINATION:   VITAL SIGNS: Blood pressure (!) 176/133, pulse 71, temperature 98.1 F (36.7 C), temperature source Oral, resp. rate 20, height 5\' 2"  (1.575 m), weight 86.2 kg, SpO2 100 %.  GENERAL:  78 y.o.-year-old patient lying in the bed with no acute distress.  EYES: Pupils equal, round, reactive to light and accommodation. No scleral icterus. Extraocular muscles intact.  HEENT: Head atraumatic, normocephalic. Oropharynx and nasopharynx clear.  NECK:  Supple, no jugular venous distention. No thyroid enlargement, no tenderness.  LUNGS: Normal breath sounds bilaterally, no wheezing, rales,rhonchi or crepitation. No use of accessory muscles of respiration.  CARDIOVASCULAR: S1, S2 normal. No murmurs, rubs, or gallops.  ABDOMEN: Soft, nontender, nondistended. Bowel sounds present. No organomegaly or mass.  EXTREMITIES: No pedal edema, cyanosis, or clubbing.  NEUROLOGIC: Cranial nerves II through XII are intact. Muscle strength 5/5 in all extremities. Sensation intact. Gait not checked.  PSYCHIATRIC: The patient is alert and oriented x 3.  SKIN: No obvious rash, lesion, or ulcer.   LABORATORY PANEL:   CBC Recent Labs  Lab 03/13/19 1418  WBC 8.2  HGB 13.9  HCT 41.9  PLT 251  MCV 86.0  MCH 28.5  MCHC 33.2  RDW 13.0  LYMPHSABS 2.2  MONOABS  0.7  EOSABS 0.5  BASOSABS 0.1   ------------------------------------------------------------------------------------------------------------------  Chemistries  Recent Labs  Lab 03/13/19 1418  NA 140  K 4.8  CL 103  CO2 26  GLUCOSE 108*  BUN 17  CREATININE 1.11*  CALCIUM 9.6  AST 25  ALT 20  ALKPHOS 66  BILITOT 0.2*   ------------------------------------------------------------------------------------------------------------------ estimated creatinine clearance is 43.2 mL/min (A) (by C-G formula based on SCr of 1.11 mg/dL (H)). ------------------------------------------------------------------------------------------------------------------ No results for input(s): TSH, T4TOTAL, T3FREE, THYROIDAB in the last 72 hours.  Invalid input(s): FREET3   Coagulation profile Recent Labs  Lab 03/13/19 1418  INR 1.0   ------------------------------------------------------------------------------------------------------------------- No results for input(s): DDIMER in the last 72 hours. -------------------------------------------------------------------------------------------------------------------  Cardiac Enzymes No results for input(s): CKMB, TROPONINI, MYOGLOBIN in the last 168 hours.  Invalid input(s): CK ------------------------------------------------------------------------------------------------------------------ Invalid input(s): POCBNP  ---------------------------------------------------------------------------------------------------------------  Urinalysis    Component Value Date/Time   COLORURINE Yellow 10/30/2013 1410   APPEARANCEUR Hazy 10/30/2013 1410   LABSPEC 1.012 10/30/2013 1410   PHURINE 8.0 10/30/2013 1410   GLUCOSEU 50 mg/dL 10/30/2013 1410   HGBUR Negative 10/30/2013 1410   BILIRUBINUR Negative 10/30/2013 1410   KETONESUR Negative 10/30/2013 1410   PROTEINUR 100 mg/dL 10/30/2013 1410   NITRITE Negative 10/30/2013 1410   LEUKOCYTESUR  Negative 10/30/2013 1410     RADIOLOGY: Ct Head Wo Contrast  Result Date: 03/13/2019 CLINICAL DATA:  Focal neuro deficit, possible stroke, word-finding difficulty, hypertension EXAM: CT HEAD WITHOUT CONTRAST TECHNIQUE: Contiguous axial images were obtained from the base of the skull through the vertex without intravenous contrast. COMPARISON:  None. FINDINGS: Brain: No evidence of acute infarction, hemorrhage, hydrocephalus, extra-axial collection or mass lesion/mass effect. Mild periventricular white matter hypodensity. Vascular: No hyperdense vessel or unexpected calcification. Skull: Normal. Negative for fracture or focal lesion. Sinuses/Orbits: No acute finding. Other: None. IMPRESSION: No acute intracranial pathology. No CT evidence of acute stroke or hemorrhage. Mild small-vessel white matter disease. Electronically Signed   By: Eddie Candle M.D.   On: 03/13/2019 15:06    EKG: Orders placed or performed during the hospital encounter of 03/13/19  . EKG 12-Lead  . EKG 12-Lead  . ED EKG  . ED EKG    IMPRESSION AND PLAN:  *TIA Monitor on telemetry, neurochecks. PT evaluation. Swallow evaluation. Check lipid panel and hemoglobin A1c. Patient has some cochlear implant, need MRI and MRA of brain if that is compatible with MRI. Echocardiogram and carotid Doppler study. Continue aspirin. Continue Zetia. Patient states that she had significant side effects by all antilipid medications like Lipitor in the past. Neurology consult for further help.  *Hypertension Currently I will continue with home medications.   All the records are reviewed and case discussed with ED provider. Management plans discussed with the patient, family and they are in agreement.  CODE STATUS: Full code. Code Status History    Date Active Date Inactive Code Status Order ID Comments User Context   09/28/2018 1416 09/28/2018 2057 Full Code [id number]  Benjaman Kindler, MD Inpatient    Advance Directive  Documentation     Most Recent Value  Type of Advance Directive  Healthcare Power of Alliance, Living will  Pre-existing out of facility DNR order (yellow form or pink MOST form)  -  "MOST" Form in Place?  -       TOTAL TIME TAKING CARE OF THIS PATIENT: 45 minutes.    Vaughan Basta M.D on 03/13/2019   Between 7am to 6pm - Pager - [phone]  After 6pm go to [email or web address] - password [email or web address] [facility]  Office  [phone]  CC: Primary care physician; Adin Hector, MD   Note: This dictation was prepared with Dragon dictation along with smaller phrase technology. Any transcriptional errors that result from this process are unintentional.

## 2019-03-13 NOTE — ED Provider Notes (Signed)
[facility] Emergency Department Provider Note   ____________________________________________   First MD Initiated Contact with Patient 03/13/19 [id number]     (approximate)  I have reviewed the triage vital signs and the nursing notes.   HISTORY  Chief Complaint No chief complaint on file.  Complaint is possible stroke  HPI Dawn Vega is a 78 y.o. female this morning patient went out with her granddaughter to get breakfast that this gives 0.  Patient wanted to talk to her granddaughter about a paper that she had written.  Patient produced word salad several times in a row she knew what she wanted to say but could not say it.  Patient then improved took her granddaughter home went to see her orthopedic physician and then was sent to her primary care doctor and here.  Patient does have some word finding difficulty which is old.  She is not producing any more word salad at this time.  He had no numbness or weakness.         Past Medical History:  Diagnosis Date  . GERD (gastroesophageal reflux disease)   . History of kidney stones   . Hyperlipidemia   . Hypertension   . Kidney stones 2014    Patient Active Problem List   Diagnosis Date Noted  . 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 APPENDECTOMY  1963  . COLONOSCOPY  2010   Dr. Candace Cruise  . DILATATION & CURETTAGE/HYSTEROSCOPY WITH MYOSURE N/A 09/28/2018   Procedure: DILATATION & CURETTAGE/HYSTEROSCOPY WITH MYOSURE, polypectomy;  Surgeon: Benjaman Kindler, MD;  Location: [facility] ORS;  Service: Gynecology;  Laterality: N/A;  . EXTERNAL EAR SURGERY Left    1970's  . FOOT SURGERY Left 1970  . [place]   due to cyst rupture  . WISDOM TOOTH EXTRACTION      Prior to Admission medications   Medication Sig Start Date End Date Taking? Authorizing Provider  metoprolol succinate (TOPROL-XL) 100 MG 24 hr tablet Take 100 mg by mouth daily. 02/11/19  02/11/20 Yes [provider]  acetaminophen (TYLENOL) 500 MG tablet Take 1,000 mg by mouth every 6 (six) hours as needed.     [provider]  aspirin 81 MG tablet Take 81 mg by mouth daily.    [provider]  Cholecalciferol 25 MCG (1000 UT) tablet Take 1,000 Units by mouth 2 (two) times daily.     [provider]  ezetimibe (ZETIA) 10 MG tablet Take 10 mg by mouth daily.    [provider]  fish oil-omega-3 fatty acids 1000 MG capsule Take 1 capsule by mouth daily.     [provider]  losartan (COZAAR) 100 MG tablet Take 1 tablet by mouth daily. 04/24/13   [provider]  Multiple Vitamins-Minerals (MULTIVITAMIN WITH MINERALS) tablet Take 1 tablet by mouth daily.    [provider]  omeprazole (PRILOSEC) 20 MG capsule Take 1 capsule by mouth daily.  04/24/13   [provider]  vitamin E 400 UNIT capsule Take 400 Units by mouth daily.    [provider]    Allergies Ace inhibitors; Calcium; Crestor [rosuvastatin calcium]; Latex; Lipitor [atorvastatin calcium]; Potassium-containing compounds; Pravastatin; and Tape  Family History  Problem Relation Age of Onset  . Cancer Paternal Aunt        breast   . Breast cancer Paternal Aunt   . Cancer Paternal Aunt  colon    Social History Social History   Tobacco Use  . Smoking status: Never Smoker  . Smokeless tobacco: Never Used  Substance Use Topics  . Alcohol use: No  . Drug use: No    Review of Systems  Constitutional: No fever/chills Eyes: No visual changes. ENT: No sore throat. Cardiovascular: Denies chest pain. Respiratory: Denies shortness of breath. Gastrointestinal: No abdominal pain.  No nausea, no vomiting.  No diarrhea.  No constipation. Genitourinary: Negative for dysuria. Musculoskeletal: Negative for back pain. Skin: Negative for rash. Neurological: Negative for headaches, focal weakness o   ____________________________________________   PHYSICAL EXAM:  VITAL SIGNS: ED Triage Vitals  Enc Vitals Group     BP 03/13/19 1409 (!) 193/97     Pulse Rate 03/13/19 1409 69     Resp 03/13/19 1409 17     Temp 03/13/19 1409 98.1 F (36.7 C)     Temp Source 03/13/19 1409 Oral     SpO2 03/13/19 1409 99 %     Weight 03/13/19 1410 190 lb (86.2 kg)     Height 03/13/19 1410 5\' 2"  (1.575 m)     Head Circumference --      Peak Flow --      Pain Score 03/13/19 1410 0     Pain Loc --      Pain Edu? --      Excl. in [place]? --     Constitutional: Alert and oriented. Well appearing and in no acute distress. Eyes: Conjunctivae are normal. PERRL. EOMI. Head: Atraumatic. Nose: No congestion/rhinnorhea. Mouth/Throat: Mucous membranes are moist.  Oropharynx non-erythematous. Neck: No stridor.   Cardiovascular: Normal rate, regular rhythm. Grossly normal heart sounds.  Good peripheral circulation. Respiratory: Normal respiratory effort.  No retractions. Lungs CTAB. Gastrointestinal: Soft and nontender. No distention. No abdominal bruits. No CVA tenderness. Musculoskeletal: No lower extremity tenderness nor edema.   Neurologic:  Normal speech and language. No gross focal neurologic deficits are appreciated. No gait instability. Skin:  Skin is warm, dry and intact.  Cranial nerves II through XII are intact.  There is a question of slight facial asymmetry but are not really sure.  Cerebellar finger-nose rapid alternating movements and hands are normal motor strength is 5/5 throughout patient does not have any numbness to report. Psychiatric: Mood and affect are normal. Speech and behavior are normal.  ____________________________________________   LABS (all labs ordered are listed, but only abnormal results are displayed)  Labs Reviewed  COMPREHENSIVE METABOLIC PANEL - Abnormal; Notable for the following components:      Result Value   Glucose, Bld 108 (*)    Creatinine, Ser 1.11 (*)     Total Bilirubin 0.2 (*)    GFR calc non Af Amer 48 (*)    GFR calc Af Amer 55 (*)    All other components within normal limits  SARS CORONAVIRUS 2 (HOSPITAL ORDER, [facility])  PROTIME-INR  APTT  CBC  DIFFERENTIAL  I-STAT CREATININE, ED  CBG MONITORING, ED   ____________________________________________  EKG  EKG read interpreted by me shows normal sinus rhythm rate of 71 left axis decreased R wave progression. ____________________________________________  RADIOLOGY  ED MD interpretation: cT read by radiology reviewed by me is negative  Official radiology report(s): Ct Head Wo Contrast  Result Date: 03/13/2019 CLINICAL DATA:  Focal neuro deficit, possible stroke, word-finding difficulty, hypertension EXAM: CT HEAD WITHOUT CONTRAST TECHNIQUE: Contiguous axial images were obtained from the base of the skull through  the vertex without intravenous contrast. COMPARISON:  None. FINDINGS: Brain: No evidence of acute infarction, hemorrhage, hydrocephalus, extra-axial collection or mass lesion/mass effect. Mild periventricular white matter hypodensity. Vascular: No hyperdense vessel or unexpected calcification. Skull: Normal. Negative for fracture or focal lesion. Sinuses/Orbits: No acute finding. Other: None. IMPRESSION: No acute intracranial pathology. No CT evidence of acute stroke or hemorrhage. Mild small-vessel white matter disease. Electronically Signed   By: Eddie Candle M.D.   On: 03/13/2019 15:06    ____________________________________________   PROCEDURES  Procedure(s) performed (including Critical Care):  Procedures   ____________________________________________   INITIAL IMPRESSION / ASSESSMENT AND PLAN / ED COURSE   Patient sounds like she had a TIA.  Everything is negative now.  We will get her in the hospital for evaluation.    Dawn Vega was evaluated in Emergency Department on 03/13/2019 for the symptoms described in the history  of present illness. She was evaluated in the context of the global COVID-19 pandemic, which necessitated consideration that the patient might be at risk for infection with the SARS-CoV-2 virus that causes COVID-19. Institutional protocols and algorithms that pertain to the evaluation of patients at risk for COVID-19 are in a state of rapid change based on information released by regulatory bodies including the CDC and federal and state organizations. These policies and algorithms were followed during the patient's care in the ED.         ____________________________________________   FINAL CLINICAL IMPRESSION(S) / ED DIAGNOSES  Final diagnoses:  TIA (transient ischemic attack)     ED Discharge Orders    None       Note:  This document was prepared using Dragon voice recognition software and may include unintentional dictation errors.    Nena Polio, MD 03/13/19 1800

## 2019-03-13 NOTE — Progress Notes (Signed)
Family Meeting Note  Advance Directive:yes  Today a meeting took place with the Patient.  The following clinical team members were present during this meeting:MD  The following were discussed:Patient's diagnosis: TIA, hypertension, Patient's progosis: Unable to determine and Goals for treatment: Full Code  Additional follow-up to be provided: Neurology  Time spent during discussion:20 minutes  Vaughan Basta, MD

## 2019-03-13 NOTE — ED Notes (Signed)
ED TO INPATIENT HANDOFF REPORT  ED Nurse Name and Phone #: Vicente Males [phone]  S Name/Age/Gender Dawn Vega 78 y.o. female Room/Bed: ED02A/ED02A  Code Status   Code Status: Prior  Home/SNF/Other Home Patient oriented to: self, place, time and situation Is this baseline? Yes   Triage Complete: Triage complete  Chief Complaint poss cva  Triage Note Pt sent from [facility] , states she was there today for an injection in her hip and her b/p was around 384 systolic, state she took her medication this morning. States while at [employer] with her 58yo granddaughter she was not able to say a certain phrase for several minutes, granddaughter recorded her but realized something was wrong at around 930am    Allergies Allergies  Allergen Reactions  . Ace Inhibitors     cough  . Calcium     Pt states she can not take  . Crestor [Rosuvastatin Calcium]     Muscle pain  . Latex     Dermatitis  . Lipitor [Atorvastatin Calcium]     unknown  . Potassium-Containing Compounds     Pt does not remember reaction or intolerance   . Pravastatin     Muscle pain  . Tape Rash    Silicone tape    Level of Care/Admitting Diagnosis ED Disposition    ED Disposition Condition Comment   Admit  The patient appears reasonably stabilized for admission considering the current resources, flow, and capabilities available in the ED at this time, and I doubt any other [facility] requiring further screening and/or treatment in the ED prior to admission is  present.       B Medical/Surgery History Past Medical History:  Diagnosis Date  . GERD (gastroesophageal reflux disease)   . History of kidney stones   . Hyperlipidemia   . Hypertension   . Kidney stones 2014   Past Surgical History:  Procedure Laterality Date  . APPENDECTOMY  1963  . COLONOSCOPY  2010   Dr. Candace Cruise  . DILATATION & CURETTAGE/HYSTEROSCOPY WITH MYOSURE N/A 09/28/2018   Procedure: DILATATION & CURETTAGE/HYSTEROSCOPY WITH MYOSURE,  polypectomy;  Surgeon: Benjaman Kindler, MD;  Location: [facility] ORS;  Service: Gynecology;  Laterality: N/A;  . EXTERNAL EAR SURGERY Left    1970's  . FOOT SURGERY Left 1970  . [place]   due to cyst rupture  . WISDOM TOOTH EXTRACTION       A IV Location/Drains/Wounds Patient Lines/Drains/Airways Status   Active Line/Drains/Airways    Name:   Placement date:   Placement time:   Site:   Days:   Incision (Closed) 09/28/18 Vagina   09/28/18    1603     166          Intake/Output Last 24 hours No intake or output data in the 24 hours ending 03/13/19 1702  Labs/Imaging Results for orders placed or performed during the hospital encounter of 03/13/19 (from the past 48 hour(s))  Protime-INR     Status: None   Collection Time: 03/13/19  2:18 PM  Result Value Ref Range   Prothrombin Time 13.0 11.4 - 15.2 seconds   INR 1.0 0.8 - 1.2    Comment: (NOTE) INR goal varies based on device and disease states. Performed at [facility], [address] [place], [place] [address]   APTT     Status: None   Collection Time: 03/13/19  2:18 PM  Result Value Ref Range   aPTT 31 24 - 36 seconds  Comment: Performed at [facility], [address] [place], [place] [address]  CBC     Status: None   Collection Time: 03/13/19  2:18 PM  Result Value Ref Range   WBC 8.2 4.0 - 10.5 K/uL   RBC 4.87 3.87 - 5.11 MIL/uL   Hemoglobin 13.9 12.0 - 15.0 g/dL   HCT 41.9 36.0 - 46.0 %   MCV 86.0 80.0 - 100.0 fL   MCH 28.5 26.0 - 34.0 pg   MCHC 33.2 30.0 - 36.0 g/dL   RDW 13.0 11.5 - 15.5 %   Platelets 251 150 - 400 K/uL   nRBC 0.0 0.0 - 0.2 %    Comment: Performed at [facility], [address] [place], [place] [address]  Differential     Status: None   Collection Time: 03/13/19  2:18 PM  Result Value Ref Range   Neutrophils Relative % 57 %   Neutro Abs 4.7 1.7 - 7.7 K/uL   Lymphocytes Relative 27 %   Lymphs Abs 2.2 0.7 - 4.0 K/uL   Monocytes Relative 9 %    Monocytes Absolute 0.7 0.1 - 1.0 K/uL   Eosinophils Relative 6 %   Eosinophils Absolute 0.5 0.0 - 0.5 K/uL   Basophils Relative 1 %   Basophils Absolute 0.1 0.0 - 0.1 K/uL   Immature Granulocytes 0 %   Abs Immature Granulocytes 0.02 0.00 - 0.07 K/uL    Comment: Performed at [facility], [address] [place], [place] [address]  Comprehensive metabolic panel     Status: Abnormal   Collection Time: 03/13/19  2:18 PM  Result Value Ref Range   Sodium 140 135 - 145 mmol/L   Potassium 4.8 3.5 - 5.1 mmol/L   Chloride 103 98 - 111 mmol/L   CO2 26 22 - 32 mmol/L   Glucose, Bld 108 (H) 70 - 99 mg/dL   BUN 17 8 - 23 mg/dL   Creatinine, Ser 1.11 (H) 0.44 - 1.00 mg/dL   Calcium 9.6 8.9 - 10.3 mg/dL   Total Protein 7.6 6.5 - 8.1 g/dL   Albumin 4.6 3.5 - 5.0 g/dL   AST 25 15 - 41 U/L   ALT 20 0 - 44 U/L   Alkaline Phosphatase 66 38 - 126 U/L   Total Bilirubin 0.2 (L) 0.3 - 1.2 mg/dL   GFR calc non Af Amer 48 (L) >60 mL/min   GFR calc Af Amer 55 (L) >60 mL/min   Anion gap 11 5 - 15    Comment: Performed at [facility], [address] [place], [place] [address]   Ct Head Wo Contrast  Result Date: 03/13/2019 CLINICAL DATA:  Focal neuro deficit, possible stroke, word-finding difficulty, hypertension EXAM: CT HEAD WITHOUT CONTRAST TECHNIQUE: Contiguous axial images were obtained from the base of the skull through the vertex without intravenous contrast. COMPARISON:  None. FINDINGS: Brain: No evidence of acute infarction, hemorrhage, hydrocephalus, extra-axial collection or mass lesion/mass effect. Mild periventricular white matter hypodensity. Vascular: No hyperdense vessel or unexpected calcification. Skull: Normal. Negative for fracture or focal lesion. Sinuses/Orbits: No acute finding. Other: None. IMPRESSION: No acute intracranial pathology. No CT evidence of acute stroke or hemorrhage. Mild small-vessel white matter disease. Electronically Signed   By: Eddie Candle M.D.    On: 03/13/2019 15:06    Pending Labs Unresulted Labs (From admission, onward)    Start     Ordered   03/13/19 1640  SARS Coronavirus 2 (CEPHEID - Performed in  [place] hospital lab), Hosp Order  (Asymptomatic Patients Labs)  ONCE - STAT,   STAT    Question:  Rule Out  Answer:  Yes   03/13/19 1639          Vitals/Pain Today's Vitals   03/13/19 1409 03/13/19 1410  BP: (!) 193/97   Pulse: 69   Resp: 17   Temp: 98.1 F (36.7 C)   TempSrc: Oral   SpO2: 99%   Weight:  86.2 kg  Height:  5\' 2"  (1.575 m)  PainSc:  0-No pain    Isolation Precautions No active isolations  Medications Medications  sodium chloride flush (NS) 0.9 % injection 3 mL (has no administration in time range)    Mobility walks Low fall risk   Focused Assessments Neuro Assessment Handoff:  Swallow screen pass? Yes    NIH Stroke Scale ( + Modified Stroke Scale Criteria)  Interval: Initial Level of Consciousness (1a.)   : Alert, keenly responsive LOC Questions (1b. )   +: Answers both questions correctly LOC Commands (1c. )   + : Performs both tasks correctly Best Gaze (2. )  +: Normal Visual (3. )  +: No visual loss Facial Palsy (4. )    : Minor paralysis Motor Arm, Left (5a. )   +: No drift Motor Arm, Right (5b. )   +: No drift Motor Leg, Left (6a. )   +: No drift Motor Leg, Right (6b. )   +: No drift Limb Ataxia (7. ): Absent Sensory (8. )   +: Normal, no sensory loss Best Language (9. )   +: No aphasia Dysarthria (10. ): Normal Extinction/Inattention (11.)   +: No Abnormality Modified SS Total  +: 0 Complete NIHSS TOTAL: 1     Neuro Assessment:   Neuro Checks:   Initial (03/13/19 1635)  Last Documented NIHSS Modified Score: 0 (03/13/19 1635) Has TPA been given? No If patient is a Neuro Trauma and patient is going to OR before floor call report to [place] nurse: [phone] or [phone]     R Recommendations: See Admitting Provider Note  Report given to:    Additional Notes:

## 2019-03-13 NOTE — ED Notes (Signed)
Damon called to state pt was in office for left hip injection, bp was elevated while at office 208/90; 208/110; 194/93. Pt has hx of HTN. Stated pt said she had an episode at 0930 this am where she couldn't get her words out while talking to her grandaughter. No symptoms at this time.

## 2019-03-13 NOTE — ED Triage Notes (Signed)
Pt sent from [facility] , states she was there today for an injection in her hip and her b/p was around 188 systolic, state she took her medication this morning. States while at [employer] with her 78yo granddaughter she was not able to say a certain phrase for several minutes, granddaughter recorded her but realized something was wrong at around 930am

## 2019-03-13 NOTE — ED Notes (Signed)
ED TO INPATIENT HANDOFF REPORT  ED Nurse Name and Phone #:  Andrea/Andie Mungin [phone]  S Name/Age/Gender Dawn Vega 78 y.o. female Room/Bed: ED02A/ED02A  Code Status   Code Status: Prior  Home/SNF/Other Home Patient oriented to: self, place, time and situation Is this baseline? Yes   Triage Complete: Triage complete  Chief Complaint poss cva  Triage Note Pt sent from [facility] , states she was there today for an injection in her hip and her b/p was around 833 systolic, state she took her medication this morning. States while at [employer] with her 26yo granddaughter she was not able to say a certain phrase for several minutes, granddaughter recorded her but realized something was wrong at around 930am    Allergies Allergies  Allergen Reactions  . Ace Inhibitors     cough  . Calcium     Pt states she can not take  . Crestor [Rosuvastatin Calcium]     Muscle pain  . Latex     Dermatitis  . Lipitor [Atorvastatin Calcium]     unknown  . Potassium-Containing Compounds     Pt does not remember reaction or intolerance   . Pravastatin     Muscle pain  . Tape Rash    Silicone tape    Level of Care/Admitting Diagnosis ED Disposition    ED Disposition Condition [facility]: [facility] [[id number]]  Level of Care: Med-Surg [16]  Covid Evaluation: Confirmed COVID Negative  Diagnosis: TIA (transient ischemic attack) [825053]  Admitting Physician: Vaughan Basta [phone]  Attending Physician: Vaughan Basta [phone]  PT Class (Do Not Modify): Observation [104]  PT Acc Code (Do Not Modify): Observation [10022]       B Medical/Surgery History Past Medical History:  Diagnosis Date  . GERD (gastroesophageal reflux disease)   . History of kidney stones   . Hyperlipidemia   . Hypertension   . Kidney stones 2014   Past Surgical History:  Procedure Laterality Date  . APPENDECTOMY  1963  . COLONOSCOPY  2010    Dr. Candace Cruise  . DILATATION & CURETTAGE/HYSTEROSCOPY WITH MYOSURE N/A 09/28/2018   Procedure: DILATATION & CURETTAGE/HYSTEROSCOPY WITH MYOSURE, polypectomy;  Surgeon: Benjaman Kindler, MD;  Location: [facility] ORS;  Service: Gynecology;  Laterality: N/A;  . EXTERNAL EAR SURGERY Left    1970's  . FOOT SURGERY Left 1970  . [place]   due to cyst rupture  . WISDOM TOOTH EXTRACTION       A IV Location/Drains/Wounds Patient Lines/Drains/Airways Status   Active Line/Drains/Airways    Name:   Placement date:   Placement time:   Site:   Days:   Incision (Closed) 09/28/18 Vagina   09/28/18    1603     166          Intake/Output Last 24 hours No intake or output data in the 24 hours ending 03/13/19 1944  Labs/Imaging Results for orders placed or performed during the hospital encounter of 03/13/19 (from the past 48 hour(s))  Protime-INR     Status: None   Collection Time: 03/13/19  2:18 PM  Result Value Ref Range   Prothrombin Time 13.0 11.4 - 15.2 seconds   INR 1.0 0.8 - 1.2    Comment: (NOTE) INR goal varies based on device and disease states. Performed at [facility], [address] [place], [place] [address]   APTT     Status: None   Collection Time: 03/13/19  2:18 PM  Result Value Ref Range   aPTT 31 24 - 36 seconds    Comment: Performed at [facility], [address] [place], [place] [address]  CBC     Status: None   Collection Time: 03/13/19  2:18 PM  Result Value Ref Range   WBC 8.2 4.0 - 10.5 K/uL   RBC 4.87 3.87 - 5.11 MIL/uL   Hemoglobin 13.9 12.0 - 15.0 g/dL   HCT 41.9 36.0 - 46.0 %   MCV 86.0 80.0 - 100.0 fL   MCH 28.5 26.0 - 34.0 pg   MCHC 33.2 30.0 - 36.0 g/dL   RDW 13.0 11.5 - 15.5 %   Platelets 251 150 - 400 K/uL   nRBC 0.0 0.0 - 0.2 %    Comment: Performed at [facility], [address] [place], [place] [address]  Differential     Status: None   Collection Time: 03/13/19  2:18 PM  Result Value Ref Range    Neutrophils Relative % 57 %   Neutro Abs 4.7 1.7 - 7.7 K/uL   Lymphocytes Relative 27 %   Lymphs Abs 2.2 0.7 - 4.0 K/uL   Monocytes Relative 9 %   Monocytes Absolute 0.7 0.1 - 1.0 K/uL   Eosinophils Relative 6 %   Eosinophils Absolute 0.5 0.0 - 0.5 K/uL   Basophils Relative 1 %   Basophils Absolute 0.1 0.0 - 0.1 K/uL   Immature Granulocytes 0 %   Abs Immature Granulocytes 0.02 0.00 - 0.07 K/uL    Comment: Performed at [facility], [address] [place], [place] [address]  Comprehensive metabolic panel     Status: Abnormal   Collection Time: 03/13/19  2:18 PM  Result Value Ref Range   Sodium 140 135 - 145 mmol/L   Potassium 4.8 3.5 - 5.1 mmol/L   Chloride 103 98 - 111 mmol/L   CO2 26 22 - 32 mmol/L   Glucose, Bld 108 (H) 70 - 99 mg/dL   BUN 17 8 - 23 mg/dL   Creatinine, Ser 1.11 (H) 0.44 - 1.00 mg/dL   Calcium 9.6 8.9 - 10.3 mg/dL   Total Protein 7.6 6.5 - 8.1 g/dL   Albumin 4.6 3.5 - 5.0 g/dL   AST 25 15 - 41 U/L   ALT 20 0 - 44 U/L   Alkaline Phosphatase 66 38 - 126 U/L   Total Bilirubin 0.2 (L) 0.3 - 1.2 mg/dL   GFR calc non Af Amer 48 (L) >60 mL/min   GFR calc Af Amer 55 (L) >60 mL/min   Anion gap 11 5 - 15    Comment: Performed at [facility], [address], [place], [place] [address]  SARS Coronavirus 2 (CEPHEID - Performed in [place] hospital lab), Hosp Order     Status: None   Collection Time: 03/13/19  5:52 PM  Result Value Ref Range   SARS Coronavirus 2 NEGATIVE NEGATIVE    Comment: (NOTE) If result is NEGATIVE SARS-CoV-2 target nucleic acids are NOT DETECTED. The SARS-CoV-2 RNA is generally detectable in upper and lower  respiratory specimens during the acute phase of infection. The lowest  concentration of SARS-CoV-2 viral copies this assay can detect is 250  copies / mL. A negative result does not preclude SARS-CoV-2 infection  and should not be used as the sole basis for treatment or other  patient management decisions.  A  negative result may occur with  improper specimen collection / handling, submission  of specimen other  than nasopharyngeal swab, presence of viral mutation(s) within the  areas targeted by this assay, and inadequate number of viral copies  (<250 copies / mL). A negative result must be combined with clinical  observations, patient history, and epidemiological information. If result is POSITIVE SARS-CoV-2 target nucleic acids are DETECTED. The SARS-CoV-2 RNA is generally detectable in upper and lower  respiratory specimens dur ing the acute phase of infection.  Positive  results are indicative of active infection with SARS-CoV-2.  Clinical  correlation with patient history and other diagnostic information is  necessary to determine patient infection status.  Positive results do  not rule out bacterial infection or co-infection with other viruses. If result is PRESUMPTIVE POSTIVE SARS-CoV-2 nucleic acids MAY BE PRESENT.   A presumptive positive result was obtained on the submitted specimen  and confirmed on repeat testing.  While 2019 novel coronavirus  (SARS-CoV-2) nucleic acids may be present in the submitted sample  additional confirmatory testing may be necessary for epidemiological  and / or clinical management purposes  to differentiate between  SARS-CoV-2 and other Sarbecovirus currently known to infect humans.  If clinically indicated additional testing with an alternate test  methodology [phone]) is advised. The SARS-CoV-2 RNA is generally  detectable in upper and lower respiratory sp ecimens during the acute  phase of infection. The expected result is Negative. Fact Sheet for Patients:  StrictlyIdeas.no Fact Sheet for Healthcare Providers: BankingDealers.co.za This test is not yet approved or cleared by the [place] FDA and has been authorized for detection and/or diagnosis of SARS-CoV-2 by FDA under an Emergency Use  Authorization (EUA).  This EUA will remain in effect (meaning this test can be used) for the duration of the COVID-19 declaration under Section 564(b)(1) of the Act, 21 U.S.C. section 360bbb-3(b)(1), unless the authorization is terminated or revoked sooner. Performed at [facility], [place], [place] [address]    Ct Head Wo Contrast  Result Date: 03/13/2019 CLINICAL DATA:  Focal neuro deficit, possible stroke, word-finding difficulty, hypertension EXAM: CT HEAD WITHOUT CONTRAST TECHNIQUE: Contiguous axial images were obtained from the base of the skull through the vertex without intravenous contrast. COMPARISON:  None. FINDINGS: Brain: No evidence of acute infarction, hemorrhage, hydrocephalus, extra-axial collection or mass lesion/mass effect. Mild periventricular white matter hypodensity. Vascular: No hyperdense vessel or unexpected calcification. Skull: Normal. Negative for fracture or focal lesion. Sinuses/Orbits: No acute finding. Other: None. IMPRESSION: No acute intracranial pathology. No CT evidence of acute stroke or hemorrhage. Mild small-vessel white matter disease. Electronically Signed   By: Eddie Candle M.D.   On: 03/13/2019 15:06    Pending Labs [employer] (From admission, onward)    Start     Ordered   Signed and Held  Hemoglobin A1c  Add-on,   R     Signed and Held   Signed and Held  Lipid panel  Add-on,   R    Comments:  Fasting    Signed and Held   Signed and Held  CBC  (heparin)  Once,   R    Comments:  Baseline for heparin therapy IF NOT ALREADY DRAWN.  Notify MD if PLT < 100 K.    Signed and Held   Signed and Held  Creatinine, serum  (heparin)  Once,   R    Comments:  Baseline for heparin therapy IF NOT ALREADY DRAWN.    Signed and Held          Vitals/Pain  Today's Vitals   03/13/19 1410 03/13/19 1735 03/13/19 1800 03/13/19 1830  BP:  (!) 173/85 (!) 175/88 (!) 176/133  Pulse:   65 71  Resp:   20   Temp:      TempSrc:       SpO2:   97% 100%  Weight: 86.2 kg     Height: 5\' 2"  (1.575 m)     PainSc: 0-No pain       Isolation Precautions No active isolations  Medications Medications  sodium chloride flush (NS) 0.9 % injection 3 mL (3 mLs Intravenous Given 03/13/19 1754)    Mobility walks with device Low fall risk   Focused Assessments Cardiac Assessment Handoff:    No results found for: CKTOTAL, CKMB, CKMBINDEX, TROPONINI No results found for: DDIMER Does the Patient currently have chest pain? No   , Neuro Assessment Handoff:  Swallow screen pass? Yes    NIH Stroke Scale ( + Modified Stroke Scale Criteria)  Interval: Initial Level of Consciousness (1a.)   : Alert, keenly responsive LOC Questions (1b. )   +: Answers both questions correctly LOC Commands (1c. )   + : Performs both tasks correctly Best Gaze (2. )  +: Normal Visual (3. )  +: No visual loss Facial Palsy (4. )    : Minor paralysis Motor Arm, Left (5a. )   +: No drift Motor Arm, Right (5b. )   +: No drift Motor Leg, Left (6a. )   +: No drift Motor Leg, Right (6b. )   +: No drift Limb Ataxia (7. ): Absent Sensory (8. )   +: Normal, no sensory loss Best Language (9. )   +: No aphasia Dysarthria (10. ): Normal Extinction/Inattention (11.)   +: No Abnormality Modified SS Total  +: 0 Complete NIHSS TOTAL: 1     Neuro Assessment:   Neuro Checks:   Initial (03/13/19 1635)  Last Documented NIHSS Modified Score: 0 (03/13/19 1635) Has TPA been given? No If patient is a Neuro Trauma and patient is going to OR before floor call report to [place] nurse: [phone] or [phone]     R Recommendations: See Admitting Provider Note  Report given to:   Additional Notes:

## 2019-03-14 ENCOUNTER — Observation Stay: Payer: Medicare HMO

## 2019-03-14 ENCOUNTER — Observation Stay (HOSPITAL_BASED_OUTPATIENT_CLINIC_OR_DEPARTMENT_OTHER)
Admit: 2019-03-14 | Discharge: 2019-03-14 | Disposition: A | Discharge status: Home / Self Care | Payer: Medicare HMO | Attending: Internal Medicine | Admitting: Internal Medicine

## 2019-03-14 DIAGNOSIS — G459 Transient cerebral ischemic attack, unspecified: Secondary | ICD-10-CM

## 2019-03-14 DIAGNOSIS — I10 Essential (primary) hypertension: Secondary | ICD-10-CM | POA: Diagnosis not present

## 2019-03-14 DIAGNOSIS — I6523 Occlusion and stenosis of bilateral carotid arteries: Secondary | ICD-10-CM | POA: Diagnosis not present

## 2019-03-14 LAB — HEMOGLOBIN A1C
Hgb A1c MFr Bld: 6.1 % — ABNORMAL HIGH (ref 4.8–5.6)
Mean Plasma Glucose: 128.37 mg/dL

## 2019-03-14 LAB — ECHOCARDIOGRAM COMPLETE
Height: 62 in
Weight: 2998.4 oz

## 2019-03-14 MED ORDER — ASPIRIN EC 325 MG PO TBEC: 325.0000 mg | DELAYED_RELEASE_TABLET | Freq: Every day | ORAL | 0 refills | Status: DC

## 2019-03-14 NOTE — Progress Notes (Signed)
*  PRELIMINARY RESULTS* Echocardiogram 2D Echocardiogram has been performed.  Sherrie Sport 03/14/2019, 8:59 AM

## 2019-03-14 NOTE — Consult Note (Signed)
Reason for Consult: speech abnormality Referring Physician: Dr. Jerelyn Charles   CC: speech abnormality   HPI: Dawn Vega is an 78 y.o. female with a known history of gastroesophageal reflux disease, kidney stone, hyperlipidemia, hypertension presented with aphasia lasting for 4- 5 minutes.  She denies any associated symptoms of headache tingling numbness or weakness.  She was completely fine after the symptoms were gone.  Back to baseline on ASA 81mg  at home.   Past Medical History:  Diagnosis Date  . GERD (gastroesophageal reflux disease)   . History of kidney stones   . Hyperlipidemia   . Hypertension   . Kidney stones 2014    Past Surgical History:  Procedure Laterality Date  . APPENDECTOMY  1963  . COLONOSCOPY  2010   Dr. Candace Cruise  . DILATATION & CURETTAGE/HYSTEROSCOPY WITH MYOSURE N/A 09/28/2018   Procedure: DILATATION & CURETTAGE/HYSTEROSCOPY WITH MYOSURE, polypectomy;  Surgeon: Benjaman Kindler, MD;  Location: [facility] ORS;  Service: Gynecology;  Laterality: N/A;  . EXTERNAL EAR SURGERY Left    1970's  . FOOT SURGERY Left 1970  . [place]   due to cyst rupture  . WISDOM TOOTH EXTRACTION      Family History  Problem Relation Age of Onset  . Cancer Paternal Aunt        breast   . Breast cancer Paternal Aunt   . Cancer Paternal Aunt        colon    Social History:  reports that she has never smoked. She has never used smokeless tobacco. She reports that she does not drink alcohol or use drugs.  Allergies  Allergen Reactions  . Ace Inhibitors     cough  . Calcium     Pt states she can not take  . Crestor [Rosuvastatin Calcium]     Muscle pain  . Latex     Dermatitis  . Lipitor [Atorvastatin Calcium]     unknown  . Potassium-Containing Compounds     Pt does not remember reaction or intolerance   . Pravastatin     Muscle pain  . Tape Rash    Silicone tape    Medications: I have reviewed the patient's current medications.  ROS: History obtained from  the patient  General ROS: negative for - chills, fatigue, fever, night sweats, weight gain or weight loss Psychological ROS: negative for - behavioral disorder, hallucinations, memory difficulties, mood swings or suicidal ideation Ophthalmic ROS: negative for - blurry vision, double vision, eye pain or loss of vision ENT ROS: negative for - epistaxis, nasal discharge, oral lesions, sore throat, tinnitus or vertigo Allergy and Immunology ROS: negative for - hives or itchy/watery eyes Hematological and Lymphatic ROS: negative for - bleeding problems, bruising or swollen lymph nodes Endocrine ROS: negative for - galactorrhea, hair pattern changes, polydipsia/polyuria or temperature intolerance Respiratory ROS: negative for - cough, hemoptysis, shortness of breath or wheezing Cardiovascular ROS: negative for - chest pain, dyspnea on exertion, edema or irregular heartbeat Gastrointestinal ROS: negative for - abdominal pain, diarrhea, hematemesis, nausea/vomiting or stool incontinence Genito-Urinary ROS: negative for - dysuria, hematuria, incontinence or urinary frequency/urgency Musculoskeletal ROS: negative for - joint swelling or muscular weakness Neurological ROS: as noted in HPI Dermatological ROS: negative for rash and skin lesion changes  Physical Examination: Blood pressure (!) 142/93, pulse 94, temperature 98 F (36.7 C), temperature source Oral, resp. rate 18, height 5\' 2"  (1.575 m), weight 85 kg, SpO2 94 %.   Neurological Examination   Mental Status: Alert,  oriented, thought content appropriate.  Speech fluent without evidence of aphasia.  Able to follow 3 step commands without difficulty. Cranial Nerves: II: Discs flat bilaterally; Visual fields grossly normal, pupils equal, round, reactive to light and accommodation III,IV, VI: ptosis not present, extra-ocular motions intact bilaterally V,VII: smile symmetric, facial light touch sensation normal bilaterally VIII: hearing normal  bilaterally IX,X: gag reflex present XI: bilateral shoulder shrug XII: midline tongue extension Motor: Right : Upper extremity   5/5    Left:     Upper extremity   5/5  Lower extremity   5/5     Lower extremity   5/5 Tone and bulk:normal tone throughout; no atrophy noted Sensory: Pinprick and light touch intact throughout, bilaterally Deep Tendon Reflexes: 2+ and symmetric throughout Plantars: Right: downgoing   Left: downgoing Cerebellar: normal finger-to-nose, normal rapid alternating movements and normal heel-to-shin test Gait: not tested      Laboratory Studies:   Basic Metabolic Panel: Recent Labs  Lab 03/13/19 1418  NA 140  K 4.8  CL 103  CO2 26  GLUCOSE 108*  BUN 17  CREATININE 1.11*  CALCIUM 9.6    Liver Function Tests: Recent Labs  Lab 03/13/19 1418  AST 25  ALT 20  ALKPHOS 66  BILITOT 0.2*  PROT 7.6  ALBUMIN 4.6   No results for input(s): LIPASE, AMYLASE in the last 168 hours. No results for input(s): AMMONIA in the last 168 hours.  CBC: Recent Labs  Lab 03/13/19 1418  WBC 8.2  NEUTROABS 4.7  HGB 13.9  HCT 41.9  MCV 86.0  PLT 251    Cardiac Enzymes: No results for input(s): CKTOTAL, CKMB, CKMBINDEX, TROPONINI in the last 168 hours.  BNP: Invalid input(s): POCBNP  CBG: No results for input(s): GLUCAP in the last 168 hours.  Microbiology: Results for orders placed or performed during the hospital encounter of 03/13/19  SARS Coronavirus 2 (CEPHEID - Performed in [facility] hospital lab), Hosp Order     Status: None   Collection Time: 03/13/19  5:52 PM  Result Value Ref Range Status   SARS Coronavirus 2 NEGATIVE NEGATIVE Final    Comment: (NOTE) If result is NEGATIVE SARS-CoV-2 target nucleic acids are NOT DETECTED. The SARS-CoV-2 RNA is generally detectable in upper and lower  respiratory specimens during the acute phase of infection. The lowest  concentration of SARS-CoV-2 viral copies this assay can detect is 250  copies / mL.  A negative result does not preclude SARS-CoV-2 infection  and should not be used as the sole basis for treatment or other  patient management decisions.  A negative result may occur with  improper specimen collection / handling, submission of specimen other  than nasopharyngeal swab, presence of viral mutation(s) within the  areas targeted by this assay, and inadequate number of viral copies  (<250 copies / mL). A negative result must be combined with clinical  observations, patient history, and epidemiological information. If result is POSITIVE SARS-CoV-2 target nucleic acids are DETECTED. The SARS-CoV-2 RNA is generally detectable in upper and lower  respiratory specimens dur ing the acute phase of infection.  Positive  results are indicative of active infection with SARS-CoV-2.  Clinical  correlation with patient history and other diagnostic information is  necessary to determine patient infection status.  Positive results do  not rule out bacterial infection or co-infection with other viruses. If result is PRESUMPTIVE POSTIVE SARS-CoV-2 nucleic acids MAY BE PRESENT.   A presumptive positive result was obtained on the  submitted specimen  and confirmed on repeat testing.  While 2019 novel coronavirus  (SARS-CoV-2) nucleic acids may be present in the submitted sample  additional confirmatory testing may be necessary for epidemiological  and / or clinical management purposes  to differentiate between  SARS-CoV-2 and other Sarbecovirus currently known to infect humans.  If clinically indicated additional testing with an alternate test  methodology [phone]) is advised. The SARS-CoV-2 RNA is generally  detectable in upper and lower respiratory sp ecimens during the acute  phase of infection. The expected result is Negative. Fact Sheet for Patients:  StrictlyIdeas.no Fact Sheet for Healthcare Providers: BankingDealers.co.za This test is not  yet approved or cleared by the [place] FDA and has been authorized for detection and/or diagnosis of SARS-CoV-2 by FDA under an Emergency Use Authorization (EUA).  This EUA will remain in effect (meaning this test can be used) for the duration of the COVID-19 declaration under Section 564(b)(1) of the Act, 21 U.S.C. section 360bbb-3(b)(1), unless the authorization is terminated or revoked sooner. Performed at [facility], [address] [place], [place] [address]     Coagulation Studies: Recent Labs    03/13/19 1418  LABPROT 13.0  INR 1.0    Urinalysis: No results for input(s): COLORURINE, LABSPEC, PHURINE, GLUCOSEU, HGBUR, BILIRUBINUR, KETONESUR, PROTEINUR, UROBILINOGEN, NITRITE, LEUKOCYTESUR in the last 168 hours.  Invalid input(s): APPERANCEUR  Lipid Panel:     Component Value Date/Time   CHOL 266 (H) 03/13/2019 1418   TRIG 370 (H) 03/13/2019 1418   HDL 49 03/13/2019 1418   CHOLHDL 5.4 03/13/2019 1418   VLDL 74 (H) 03/13/2019 1418   LDLCALC 143 (H) 03/13/2019 1418    HgbA1C: No results found for: HGBA1C  Urine Drug Screen:  No results found for: LABOPIA, COCAINSCRNUR, LABBENZ, AMPHETMU, THCU, LABBARB  Alcohol Level: No results for input(s): ETH in the last 168 hours.  Other results: EKG: normal EKG, normal sinus rhythm, unchanged from previous tracings.  Imaging: Dg Chest 2 View  Result Date: 03/13/2019 CLINICAL DATA:  Transient ischemic attack today. EXAM: CHEST - 2 VIEW COMPARISON:  None. FINDINGS: Lungs clear. Heart size normal. No pneumothorax or pleural fluid. No acute or focal bony abnormality. IMPRESSION: No acute disease. Electronically Signed   By: Inge Rise M.D.   On: 03/13/2019 21:09   Ct Head Wo Contrast  Result Date: 03/13/2019 CLINICAL DATA:  Focal neuro deficit, possible stroke, word-finding difficulty, hypertension EXAM: CT HEAD WITHOUT CONTRAST TECHNIQUE: Contiguous axial images were obtained from the base of the skull  through the vertex without intravenous contrast. COMPARISON:  None. FINDINGS: Brain: No evidence of acute infarction, hemorrhage, hydrocephalus, extra-axial collection or mass lesion/mass effect. Mild periventricular white matter hypodensity. Vascular: No hyperdense vessel or unexpected calcification. Skull: Normal. Negative for fracture or focal lesion. Sinuses/Orbits: No acute finding. Other: None. IMPRESSION: No acute intracranial pathology. No CT evidence of acute stroke or hemorrhage. Mild small-vessel white matter disease. Electronically Signed   By: Eddie Candle M.D.   On: 03/13/2019 15:06   US Carotid Bilateral (at [facility] Only)  Result Date: 03/14/2019 CLINICAL DATA:  TIA EXAM: BILATERAL CAROTID DUPLEX ULTRASOUND TECHNIQUE: Pearline Cables scale imaging, color Doppler and duplex ultrasound were performed of bilateral carotid and vertebral arteries in the neck. COMPARISON:  None. FINDINGS: Criteria: Quantification of carotid stenosis is based on velocity parameters that correlate the residual internal carotid diameter with NASCET-based stenosis levels, using the diameter of the distal internal carotid lumen as the denominator for stenosis measurement.  The following velocity measurements were obtained: RIGHT ICA: 75 cm/sec CCA: 66 cm/sec SYSTOLIC ICA/CCA RATIO:  1.1 ECA: 57 cm/sec LEFT ICA: 89 cm/sec CCA: 74 cm/sec SYSTOLIC ICA/CCA RATIO:  1.2 ECA: 95 cm/sec RIGHT CAROTID ARTERY: Mild smooth mixed plaque in the bulb. Low resistance internal carotid Doppler pattern is preserved. RIGHT VERTEBRAL ARTERY:  Antegrade. LEFT CAROTID ARTERY: Moderate smooth mixed plaque in the bulb. Low resistance internal carotid Doppler pattern is preserved. LEFT VERTEBRAL ARTERY:  Antegrade. IMPRESSION: Less than 50% stenosis in the right and left internal carotid artery. There is smooth plaque in both bulbs as described above. Electronically Signed   By: Marybelle Killings M.D.   On: 03/14/2019 08:20     Assessment/Plan: 78 y.o.  female with a known history of gastroesophageal reflux disease, kidney stone, hyperlipidemia, hypertension presented with aphasia lasting for 4- 5 minutes.  She denies any associated symptoms of headache tingling numbness or weakness.  She was completely fine after the symptoms were gone.  Back to baseline on ASA 81mg  at home.   Unable to obtain MRI as L ear implant.  Back to baseline Increase ASA 81mg  to 325 daily  D/c planning today.  Leotis Pain  03/14/2019, 11:23 AM

## 2019-03-14 NOTE — Evaluation (Signed)
Physical Therapy Evaluation Patient Details Name: Dawn Vega MRN: [id number] DOB: 08-03-1941 Today's Date: 03/14/2019   History of Present Illness  From MD H&P:  Pt is a 78 y.o. female with a known history of gastroesophageal reflux disease, kidney stone, hyperlipidemia, hypertension-was with her granddaughter at [place] for breakfast, while talking to her she could not come up with proper words and had " word salad" these episodes lasted for 4- 5 minutes.  She denies any associated symptoms of headache tingling numbness or weakness.  She was completely fine after the symptoms were gone.  Her family was worried because of the symptoms but as she recovered she continued regular works.  She went for doctor's appointment to have injection in her hip for the pain.  In the clinic they noted her blood pressure to be very high, and concerned with this complaints in the morning of having difficulty speaking-they advised to go to emergency room to have work-up for stroke.    Clinical Impression  Pt presents without deficits in strength, transfers, mobility, gait, balance, or activity tolerance.  Sensation to light touch and proprioception intact to BLEs and BUEs.  No deficits in UE or LE coordination noted.  VOR, tracking, and peripheral vision all WNL.  Pt Ind with all functional mobility tasks including amb 200' without an AD and ascending/descending stairs.  Pt reports feeling at baseline with no PT needs identified.  Will complete PT orders at this time but will reassess pt pending a change in status upon receipt of new PT orders.        Follow Up Recommendations No PT follow up    Equipment Recommendations  None recommended by PT    Recommendations for Other Services       Precautions / Restrictions Precautions Precautions: None Restrictions Weight Bearing Restrictions: No      Mobility  Bed Mobility Overal bed mobility: Independent                Transfers Overall  transfer level: Independent               General transfer comment: Good eccentric and concentric control  Ambulation/Gait Ambulation/Gait assistance: Independent Gait Distance (Feet): 200 Feet Assistive device: None Gait Pattern/deviations: Step-through pattern;Antalgic Gait velocity: decreased   General Gait Details: Mildly antalgic gait pattern on the LLE that pt reports is chronic secondary to hip pain that she receives injections for  Stairs Stairs: Yes Stairs assistance: Independent Stair Management: No rails Number of Stairs: 2 General stair comments: Good eccentric and concentric control ascending and descending steps  Wheelchair Mobility    Modified Rankin (Stroke Patients Only)       Balance Overall balance assessment: Independent((-) Romberg sign, SLS time >/= 7 sec bilaterally)                                           Pertinent Vitals/Pain Pain Assessment: No/denies pain    Home Living Family/patient expects to be discharged to:: Private residence Living Arrangements: Spouse/significant other Available Help at Discharge: Family Type of Home: House Home Access: Stairs to enter Entrance Stairs-Rails: None Entrance Stairs-Number of Steps: 1 Home Layout: One level Home Equipment: Cane - single point      Prior Function Level of Independence: Independent         Comments: Pt Ind with amb community distances without an AD, does endorse  occasional use of SPC for exeptionally long walks due to chronic hip pain, Ind with ADLs, no fall history     Hand Dominance   Dominant Hand: Right    Extremity/Trunk Assessment   Upper Extremity Assessment Upper Extremity Assessment: Overall WFL for tasks assessed(BUE sensation to light touch and proprioception intact; no deficits in coordination noted )    Lower Extremity Assessment Lower Extremity Assessment: Overall WFL for tasks assessed(BLE sensation to light touch and  proprioception intact; no deficits in coordination noted)       Communication   Communication: No difficulties  Cognition Arousal/Alertness: Awake/alert Behavior During Therapy: WFL for tasks assessed/performed Overall Cognitive Status: Within Functional Limits for tasks assessed                                        General Comments      Exercises     Assessment/Plan    PT Assessment Patent does not need any further PT services  PT Problem List         PT Treatment Interventions      PT Goals (Current goals can be found in the Care Plan section)  Acute Rehab PT Goals PT Goal Formulation: All assessment and education complete, DC therapy    Frequency     Barriers to discharge        Co-evaluation               AM-PAC PT "6 Clicks" Mobility  Outcome Measure Help needed turning from your back to your side while in a flat bed without using bedrails?: None Help needed moving from lying on your back to sitting on the side of a flat bed without using bedrails?: None Help needed moving to and from a bed to a chair (including a wheelchair)?: None Help needed standing up from a chair using your arms (e.g., wheelchair or bedside chair)?: None Help needed to walk in hospital room?: None Help needed climbing 3-5 steps with a railing? : None 6 Click Score: 24    End of Session Equipment Utilized During Treatment: Gait belt Activity Tolerance: Patient tolerated treatment well Patient left: in chair;with call bell/phone within reach Nurse Communication: Mobility status PT Visit Diagnosis: Other symptoms and signs involving the nervous system (R29.898)    Time: 7782-4235 PT Time Calculation (min) (ACUTE ONLY): 21 min   Charges:   PT Evaluation $PT Eval Low Complexity: 1 Low          D. Scott Marga Gramajo PT, DPT 03/14/19, 10:55 AM

## 2019-03-14 NOTE — Plan of Care (Signed)
  Problem: Education: Goal: Knowledge of disease or condition will improve Outcome: Progressing Goal: Knowledge of secondary prevention will improve Outcome: Progressing Goal: Knowledge of patient specific risk factors addressed and post discharge goals established will improve Outcome: Progressing   Problem: Coping: Goal: Will identify appropriate support needs Outcome: Progressing   Problem: Health Behavior/Discharge Planning: Goal: Ability to manage health-related needs will improve Outcome: Progressing   Problem: Self-Care: Goal: Ability to communicate needs accurately will improve Outcome: Progressing   Problem: Nutrition: Goal: Risk of aspiration will decrease Outcome: Progressing   Problem: Ischemic Stroke/TIA Tissue Perfusion: Goal: Complications of ischemic stroke/TIA will be minimized Outcome: Progressing

## 2019-03-14 NOTE — Discharge Summary (Signed)
[facility] NAME: Dawn Vega    MR#:  [id number]  DATE OF BIRTH:  August 17, 1941  DATE OF ADMISSION:  03/13/2019 ADMITTING PHYSICIAN: Vaughan Basta, MD  DATE OF DISCHARGE: No discharge date for patient encounter.  PRIMARY CARE PHYSICIAN: Tama High III, MD    ADMISSION DIAGNOSIS:  TIA (transient ischemic attack) [G45.9]  DISCHARGE DIAGNOSIS:  Active Problems:   TIA (transient ischemic attack)   SECONDARY DIAGNOSIS:   Past Medical History:  Diagnosis Date  . GERD (gastroesophageal reflux disease)   . History of kidney stones   . Hyperlipidemia   . Hypertension   . 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 Ace Inhibitors     cough  . Calcium     Pt states she can not take  . Crestor [Rosuvastatin Calcium]     Muscle pain  . Latex     Dermatitis  . Lipitor [Atorvastatin Calcium]     unknown  . Potassium-Containing Compounds     Pt does not remember reaction or intolerance   . Pravastatin     Muscle pain  . Tape Rash    Silicone tape    DISCHARGE MEDICATIONS:   Allergies as of 03/14/2019      Reactions   Ace Inhibitors    cough   Calcium    Pt states she can not take   Crestor [rosuvastatin Calcium]    Muscle pain   Latex    Dermatitis   Lipitor [atorvastatin Calcium]    unknown   Potassium-containing Compounds    Pt does not remember reaction or intolerance    Pravastatin    Muscle pain   Tape Rash   Silicone tape      Medication List    STOP taking these medications   aspirin 81 MG tablet Replaced by:  aspirin EC 325 MG tablet     TAKE these medications   acetaminophen 500 MG tablet Commonly known as:  TYLENOL Take 1,000 mg by mouth every 6 (six) hours as needed.   aspirin EC 325 MG tablet Take 1 tablet (325 mg total) by mouth daily. Replaces:  aspirin 81 MG tablet   Cholecalciferol 25 MCG (1000 UT) tablet Take 1,000 Units by mouth 2 (two) times daily.   ezetimibe 10 MG tablet Commonly known as:  ZETIA Take 10 mg by mouth daily.   fish oil-omega-3 fatty acids 1000 MG capsule Take 1 capsule by mouth daily.   losartan 100 MG tablet Commonly known as:  COZAAR Take 1 tablet by mouth daily.   metoprolol succinate 100 MG 24 hr tablet Commonly known as:  TOPROL-XL Take 100 mg by mouth daily.   multivitamin with minerals tablet Take 1 tablet by mouth daily.   omeprazole 20 MG capsule Commonly known as:  PRILOSEC Take 1 capsule by mouth daily.   vitamin E 400 UNIT capsule Take 400 Units by mouth daily.        DISCHARGE INSTRUCTIONS:  If you experience worsening of your admission symptoms, develop shortness of breath, life threatening emergency, suicidal or homicidal thoughts you must seek medical attention immediately by calling 911 or calling your MD immediately  if symptoms less severe.  You Must read complete instructions/literature along with all the possible adverse reactions/side effects for all the Medicines you take and that have been prescribed to you. Take any new Medicines after you have completely understood and accept all the possible adverse reactions/side effects.   Please note  You were cared for by a hospitalist during your hospital stay. If you have any questions about your discharge medications or the care you received while you were in the hospital after you are discharged, you can call the  unit and asked to speak with the hospitalist on call if the hospitalist that took care of you is not available. Once you are discharged, your primary care physician will handle any further medical issues. Please note that NO REFILLS for any discharge medications will be authorized once you are discharged, as it is imperative that you return to your primary care physician (or establish a relationship with a primary care physician if you do not have one) for your aftercare needs so that they can reassess your need for medications and monitor your lab values.    Today   CHIEF COMPLAINT:  Speech difficulty  HISTORY OF PRESENT ILLNESS:  78 y.o. female with a known history of gastroesophageal reflux disease, kidney stone, hyperlipidemia, hypertension-was with her granddaughter at [employer] for breakfast, while talking to her she could not come up with proper words and had " word salad" these episodes lasted for 4- 5 minutes.  She denies any associated symptoms of headache tingling numbness or weakness.  She was completely fine after the symptoms were gone.  Her family was worried because of the symptoms but as she recovered she continued regular works.  She went for doctor's appointment to have injection in her hip for the pain. In the clinic they noted her blood pressure to be very high, and concerned with this complaints in the morning of having difficulty speaking-they advised to go to emergency room to have work-up for stroke. Patient was completely asymptomatic during my visit.   VITAL SIGNS:  Blood pressure (!) 142/93, pulse 94, temperature 98 F (36.7 C), temperature source Oral, resp. rate 18, height 5\' 2"  (1.575 m), weight 85 kg, SpO2 94 %.  I/O:    Intake/Output Summary (Last 24 hours) at 03/14/2019 1116 Last data filed at 03/14/2019 0900 Gross per 24 hour  Intake 240 ml  Output -  Net 240 ml    PHYSICAL EXAMINATION:  GENERAL:  78 y.o.-year-old patient lying in the bed with no  acute distress.  EYES: Pupils equal, round, reactive to light and accommodation. No scleral icterus. Extraocular muscles intact.  HEENT: Head atraumatic, normocephalic. Oropharynx and nasopharynx clear.  NECK:  Supple, no jugular venous distention. No thyroid enlargement, no tenderness.  LUNGS: Normal breath sounds bilaterally, no wheezing, rales,rhonchi or crepitation. No use of accessory muscles of respiration.  CARDIOVASCULAR: S1, S2 normal. No murmurs, rubs, or gallops.  ABDOMEN: Soft, non-tender, non-distended. Bowel sounds present. No organomegaly or mass.  EXTREMITIES: No pedal edema, cyanosis, or clubbing.  NEUROLOGIC: Cranial nerves II through XII are intact. Muscle strength 5/5 in all extremities. Sensation intact. Gait not checked.  PSYCHIATRIC: The patient is alert and oriented x 3.  SKIN: No obvious rash, lesion, or ulcer.   DATA REVIEW:   CBC  Recent Labs  Lab 03/13/19 1418  WBC 8.2  HGB 13.9  HCT 41.9  PLT 251    Chemistries  Recent Labs  Lab 03/13/19 1418  NA 140  K 4.8  CL 103  CO2 26  GLUCOSE 108*  BUN 17  CREATININE 1.11*  CALCIUM 9.6  AST 25  ALT 20  ALKPHOS 66  BILITOT 0.2*    Cardiac Enzymes No results for input(s): TROPONINI in the last 168 hours.  Microbiology Results  Results for orders placed or performed during the hospital encounter of 03/13/19  SARS Coronavirus 2 (CEPHEID - Performed in [place] hospital lab), Hosp Order     Status: None   Collection Time: 03/13/19  5:52 PM  Result Value Ref Range Status   SARS Coronavirus 2 NEGATIVE NEGATIVE Final    Comment: (NOTE) If result is NEGATIVE SARS-CoV-2 target nucleic acids are NOT DETECTED. The SARS-CoV-2 RNA is generally detectable in upper and lower  respiratory specimens during the acute phase of infection. The lowest  concentration of SARS-CoV-2 viral copies this assay can detect is 250  copies / mL. A negative result does not preclude SARS-CoV-2 infection  and should not be  used as the sole basis for treatment or other  patient management decisions.  A negative result may occur with  improper specimen collection / handling, submission of specimen other  than nasopharyngeal swab, presence of viral mutation(s) within the  areas targeted by this assay, and inadequate number of viral copies  (<250 copies / mL). A negative result must be combined with clinical  observations, patient history, and epidemiological information. If result is POSITIVE SARS-CoV-2 target nucleic acids are DETECTED. The SARS-CoV-2 RNA is generally detectable in upper and lower  respiratory specimens dur ing the acute phase of infection.  Positive  results are indicative of active infection with SARS-CoV-2.  Clinical  correlation with patient history and other diagnostic information is  necessary to determine patient infection status.  Positive results do  not rule out bacterial infection or co-infection with other viruses. If result is PRESUMPTIVE POSTIVE SARS-CoV-2 nucleic acids MAY BE PRESENT.   A presumptive positive result was obtained on the submitted specimen  and confirmed on repeat testing.  While 2019 novel coronavirus  (SARS-CoV-2) nucleic acids may be present in the submitted sample  additional confirmatory testing may be necessary for epidemiological  and / or clinical management purposes  to differentiate between  SARS-CoV-2 and other Sarbecovirus currently known to infect humans.  If clinically indicated additional testing with an alternate test  methodology [phone]) is advised. The SARS-CoV-2 RNA is generally  detectable in upper and lower respiratory sp ecimens during the acute  phase of infection. The expected result is Negative. Fact Sheet for Patients:  StrictlyIdeas.no Fact Sheet for Healthcare Providers: BankingDealers.co.za This test is not yet approved or cleared by the [place] FDA and has been authorized  for detection and/or diagnosis of SARS-CoV-2 by FDA under an Emergency Use Authorization (EUA).  This EUA will remain in effect (meaning this test can be used) for the duration of the COVID-19 declaration under Section 564(b)(1) of the Act, 21 U.S.C. section 360bbb-3(b)(1), unless the authorization is terminated or revoked sooner. Performed at [facility], [address] [place], [place] [address]     RADIOLOGY:  Dg Chest 2 View  Result Date: 03/13/2019 CLINICAL DATA:  Transient ischemic attack today. EXAM: CHEST - 2 VIEW COMPARISON:  None. FINDINGS: Lungs clear. Heart size normal. No pneumothorax or  pleural fluid. No acute or focal bony abnormality. IMPRESSION: No acute disease. Electronically Signed   By: Inge Rise M.D.   On: 03/13/2019 21:09   Ct Head Wo Contrast  Result Date: 03/13/2019 CLINICAL DATA:  Focal neuro deficit, possible stroke, word-finding difficulty, hypertension EXAM: CT HEAD WITHOUT CONTRAST TECHNIQUE: Contiguous axial images were obtained from the base of the skull through the vertex without intravenous contrast. COMPARISON:  None. FINDINGS: Brain: No evidence of acute infarction, hemorrhage, hydrocephalus, extra-axial collection or mass lesion/mass effect. Mild periventricular white matter hypodensity. Vascular: No hyperdense vessel or unexpected calcification. Skull: Normal. Negative for fracture or focal lesion. Sinuses/Orbits: No acute finding. Other: None. IMPRESSION: No acute intracranial pathology. No CT evidence of acute stroke or hemorrhage. Mild small-vessel white matter disease. Electronically Signed   By: Eddie Candle M.D.   On: 03/13/2019 15:06   US Carotid Bilateral (at [facility] Only)  Result Date: 03/14/2019 CLINICAL DATA:  TIA EXAM: BILATERAL CAROTID DUPLEX ULTRASOUND TECHNIQUE: Pearline Cables scale imaging, color Doppler and duplex ultrasound were performed of bilateral carotid and vertebral arteries in the neck. COMPARISON:  None. FINDINGS:  Criteria: Quantification of carotid stenosis is based on velocity parameters that correlate the residual internal carotid diameter with NASCET-based stenosis levels, using the diameter of the distal internal carotid lumen as the denominator for stenosis measurement. The following velocity measurements were obtained: RIGHT ICA: 75 cm/sec CCA: 66 cm/sec SYSTOLIC ICA/CCA RATIO:  1.1 ECA: 57 cm/sec LEFT ICA: 89 cm/sec CCA: 74 cm/sec SYSTOLIC ICA/CCA RATIO:  1.2 ECA: 95 cm/sec RIGHT CAROTID ARTERY: Mild smooth mixed plaque in the bulb. Low resistance internal carotid Doppler pattern is preserved. RIGHT VERTEBRAL ARTERY:  Antegrade. LEFT CAROTID ARTERY: Moderate smooth mixed plaque in the bulb. Low resistance internal carotid Doppler pattern is preserved. LEFT VERTEBRAL ARTERY:  Antegrade. IMPRESSION: Less than 50% stenosis in the right and left internal carotid artery. There is smooth plaque in both bulbs as described above. Electronically Signed   By: Marybelle Killings M.D.   On: 03/14/2019 08:20    EKG:   Orders placed or performed during the hospital encounter of 03/13/19  . EKG 12-Lead  . EKG 12-Lead  . ED EKG  . ED EKG      Management plans discussed with the patient, family and they are in agreement.  CODE STATUS:     Code Status Orders  (From admission, onward)         Start     Ordered   03/13/19 2045  Full code  Continuous     03/13/19 2044        Code Status History    Date Active Date Inactive Code Status Order ID Comments User Context   09/28/2018 1416 09/28/2018 2057 Full Code [id number]  Benjaman Kindler, MD Inpatient    Advance Directive Documentation     Most Recent Value  Type of Advance Directive  Healthcare Power of [place], Living will  Pre-existing out of facility DNR order (yellow form or pink MOST form)  -  "MOST" Form in Place?  -      TOTAL TIME TAKING CARE OF THIS PATIENT: 35 minutes.    Avel Peace Adante Courington M.D on 03/14/2019 at 11:16 AM  Between 7am to 6pm -  Pager - [phone]  After 6pm go to [email or web address] - password [email or web address] [facility]  Office  [phone]  CC: Primary care physician; Adin Hector, MD   Note: This dictation was prepared with Dragon dictation  along with smaller phrase technology. Any transcriptional errors that result from this process are unintentional.

## 2019-03-14 NOTE — Progress Notes (Signed)
Patient discharged home per MD order. All discharge instructions given and all questions answered. 

## 2019-03-14 NOTE — Progress Notes (Signed)
OT Cancellation Note  Patient Details Name: Dawn Vega MRN: [id number] DOB: May 25, 1941   Cancelled Treatment:    Reason Eval/Treat Not Completed: OT screened, no needs identified, will sign off Order received and chart reviewed.  Pt is back to baseline for ADLs and does not have any needs and OT screening only completed due to no needs.  Please re-consult if anything medically changes.  Thank you for the referral.  Chrys Racer, OTR/L ascom [phone] 03/14/19, 10:52 AM

## 2019-03-19 DIAGNOSIS — G459 Transient cerebral ischemic attack, unspecified: Secondary | ICD-10-CM | POA: Diagnosis not present

## 2019-03-20 DIAGNOSIS — I739 Peripheral vascular disease, unspecified: Secondary | ICD-10-CM | POA: Diagnosis not present

## 2019-03-20 DIAGNOSIS — G459 Transient cerebral ischemic attack, unspecified: Secondary | ICD-10-CM | POA: Diagnosis not present

## 2019-03-20 DIAGNOSIS — R739 Hyperglycemia, unspecified: Secondary | ICD-10-CM | POA: Diagnosis not present

## 2019-03-20 DIAGNOSIS — M5136 Other intervertebral disc degeneration, lumbar region: Secondary | ICD-10-CM | POA: Diagnosis not present

## 2019-03-20 DIAGNOSIS — I10 Essential (primary) hypertension: Secondary | ICD-10-CM | POA: Diagnosis not present

## 2019-03-28 DIAGNOSIS — M1612 Unilateral primary osteoarthritis, left hip: Secondary | ICD-10-CM | POA: Diagnosis not present

## 2019-04-18 DIAGNOSIS — K219 Gastro-esophageal reflux disease without esophagitis: Secondary | ICD-10-CM | POA: Diagnosis not present

## 2019-04-18 DIAGNOSIS — R739 Hyperglycemia, unspecified: Secondary | ICD-10-CM | POA: Diagnosis not present

## 2019-04-18 DIAGNOSIS — M8949 Other hypertrophic osteoarthropathy, multiple sites: Secondary | ICD-10-CM | POA: Diagnosis not present

## 2019-04-18 DIAGNOSIS — I10 Essential (primary) hypertension: Secondary | ICD-10-CM | POA: Diagnosis not present

## 2019-04-18 DIAGNOSIS — E7849 Other hyperlipidemia: Secondary | ICD-10-CM | POA: Diagnosis not present

## 2019-04-18 DIAGNOSIS — I739 Peripheral vascular disease, unspecified: Secondary | ICD-10-CM | POA: Diagnosis not present

## 2019-05-08 DIAGNOSIS — K219 Gastro-esophageal reflux disease without esophagitis: Secondary | ICD-10-CM | POA: Diagnosis not present

## 2019-05-08 DIAGNOSIS — G459 Transient cerebral ischemic attack, unspecified: Secondary | ICD-10-CM | POA: Diagnosis not present

## 2019-05-08 DIAGNOSIS — I10 Essential (primary) hypertension: Secondary | ICD-10-CM | POA: Diagnosis not present

## 2019-05-08 DIAGNOSIS — E7849 Other hyperlipidemia: Secondary | ICD-10-CM | POA: Diagnosis not present

## 2019-06-11 ENCOUNTER — Ambulatory Visit: Payer: Medicare HMO | Admitting: Cardiovascular Disease

## 2019-06-11 DIAGNOSIS — R002 Palpitations: Secondary | ICD-10-CM | POA: Diagnosis not present

## 2019-06-25 DIAGNOSIS — R002 Palpitations: Secondary | ICD-10-CM | POA: Insufficient documentation

## 2019-07-02 DIAGNOSIS — M1612 Unilateral primary osteoarthritis, left hip: Secondary | ICD-10-CM | POA: Diagnosis not present

## 2019-07-23 DIAGNOSIS — M5136 Other intervertebral disc degeneration, lumbar region: Secondary | ICD-10-CM | POA: Diagnosis not present

## 2019-07-23 DIAGNOSIS — M5416 Radiculopathy, lumbar region: Secondary | ICD-10-CM | POA: Diagnosis not present

## 2019-07-23 DIAGNOSIS — M48062 Spinal stenosis, lumbar region with neurogenic claudication: Secondary | ICD-10-CM | POA: Diagnosis not present

## 2019-08-05 DIAGNOSIS — I739 Peripheral vascular disease, unspecified: Secondary | ICD-10-CM | POA: Diagnosis not present

## 2019-08-05 DIAGNOSIS — Z780 Asymptomatic menopausal state: Secondary | ICD-10-CM | POA: Diagnosis not present

## 2019-08-05 DIAGNOSIS — I10 Essential (primary) hypertension: Secondary | ICD-10-CM | POA: Diagnosis not present

## 2019-08-05 DIAGNOSIS — K219 Gastro-esophageal reflux disease without esophagitis: Secondary | ICD-10-CM | POA: Diagnosis not present

## 2019-08-05 DIAGNOSIS — Z23 Encounter for immunization: Secondary | ICD-10-CM | POA: Diagnosis not present

## 2019-08-05 DIAGNOSIS — M8949 Other hypertrophic osteoarthropathy, multiple sites: Secondary | ICD-10-CM | POA: Diagnosis not present

## 2019-08-05 DIAGNOSIS — E7849 Other hyperlipidemia: Secondary | ICD-10-CM | POA: Diagnosis not present

## 2019-08-05 DIAGNOSIS — Z0000 Encounter for general adult medical examination without abnormal findings: Secondary | ICD-10-CM | POA: Diagnosis not present

## 2019-08-05 DIAGNOSIS — R739 Hyperglycemia, unspecified: Secondary | ICD-10-CM | POA: Diagnosis not present

## 2019-08-19 DIAGNOSIS — R05 Cough: Secondary | ICD-10-CM | POA: Diagnosis not present

## 2019-08-19 DIAGNOSIS — M48062 Spinal stenosis, lumbar region with neurogenic claudication: Secondary | ICD-10-CM | POA: Diagnosis not present

## 2019-08-19 DIAGNOSIS — R739 Hyperglycemia, unspecified: Secondary | ICD-10-CM | POA: Diagnosis not present

## 2019-08-19 DIAGNOSIS — I10 Essential (primary) hypertension: Secondary | ICD-10-CM | POA: Diagnosis not present

## 2019-08-19 DIAGNOSIS — E785 Hyperlipidemia, unspecified: Secondary | ICD-10-CM | POA: Diagnosis not present

## 2019-08-20 DIAGNOSIS — M1612 Unilateral primary osteoarthritis, left hip: Secondary | ICD-10-CM | POA: Diagnosis not present

## 2019-08-21 ENCOUNTER — Other Ambulatory Visit: Payer: Self-pay | Admitting: Internal Medicine

## 2019-08-21 DIAGNOSIS — Z1231 Encounter for screening mammogram for malignant neoplasm of breast: Secondary | ICD-10-CM

## 2019-08-22 DIAGNOSIS — M48062 Spinal stenosis, lumbar region with neurogenic claudication: Secondary | ICD-10-CM | POA: Diagnosis not present

## 2019-08-22 DIAGNOSIS — M5416 Radiculopathy, lumbar region: Secondary | ICD-10-CM | POA: Diagnosis not present

## 2019-08-22 DIAGNOSIS — M5136 Other intervertebral disc degeneration, lumbar region: Secondary | ICD-10-CM | POA: Diagnosis not present

## 2019-08-24 DIAGNOSIS — M1612 Unilateral primary osteoarthritis, left hip: Secondary | ICD-10-CM | POA: Insufficient documentation

## 2019-09-25 DIAGNOSIS — I10 Essential (primary) hypertension: Secondary | ICD-10-CM | POA: Diagnosis not present

## 2019-10-14 ENCOUNTER — Ambulatory Visit
Admission: RE | Admit: 2019-10-14 | Discharge: 2019-10-14 | Disposition: A | Discharge status: Home / Self Care | Payer: Medicare HMO | Source: Ambulatory Visit | Attending: Internal Medicine | Admitting: Internal Medicine

## 2019-10-14 DIAGNOSIS — Z1231 Encounter for screening mammogram for malignant neoplasm of breast: Secondary | ICD-10-CM | POA: Diagnosis not present

## 2019-11-19 DIAGNOSIS — E785 Hyperlipidemia, unspecified: Secondary | ICD-10-CM | POA: Diagnosis not present

## 2019-11-19 DIAGNOSIS — I739 Peripheral vascular disease, unspecified: Secondary | ICD-10-CM | POA: Diagnosis not present

## 2019-11-19 DIAGNOSIS — R05 Cough: Secondary | ICD-10-CM | POA: Diagnosis not present

## 2019-11-19 DIAGNOSIS — M8949 Other hypertrophic osteoarthropathy, multiple sites: Secondary | ICD-10-CM | POA: Diagnosis not present

## 2019-11-19 DIAGNOSIS — E7849 Other hyperlipidemia: Secondary | ICD-10-CM | POA: Diagnosis not present

## 2019-11-19 DIAGNOSIS — K219 Gastro-esophageal reflux disease without esophagitis: Secondary | ICD-10-CM | POA: Diagnosis not present

## 2019-11-19 DIAGNOSIS — R739 Hyperglycemia, unspecified: Secondary | ICD-10-CM | POA: Diagnosis not present

## 2019-11-19 DIAGNOSIS — I10 Essential (primary) hypertension: Secondary | ICD-10-CM | POA: Diagnosis not present

## 2019-11-19 DIAGNOSIS — M5136 Other intervertebral disc degeneration, lumbar region: Secondary | ICD-10-CM | POA: Diagnosis not present

## 2019-12-05 NOTE — Discharge Instructions (Signed)
Instructions after Total Hip Replacement     Jerrold Haskell P. Weltha Cathy, Jr., M.D.     Dept. of Orthopaedics & Sports Medicine  [facility]  [address]  [place], [place]  [address]  Phone: [phone]   Fax: [phone]    DIET: . Drink plenty of non-alcoholic fluids. . Resume your normal diet. Include foods high in fiber.  ACTIVITY:  . You may use crutches or a walker with weight-bearing as tolerated, unless instructed otherwise. . You may be weaned off of the walker or crutches by your Physical Therapist.  . Do NOT reach below the level of your knees or cross your legs until allowed.    . Continue doing gentle exercises. Exercising will reduce the pain and swelling, increase motion, and prevent muscle weakness.   . Please continue to use the TED compression stockings for 6 weeks. You may remove the stockings at night, but should reapply them in the morning. . Do not drive or operate any equipment until instructed.  WOUND CARE:  . Continue to use ice packs periodically to reduce pain and swelling. . Keep the incision clean and dry. . You may bathe or shower after the staples are removed at the first office visit following surgery.  MEDICATIONS: . You may resume your regular medications. . Please take the pain medication as prescribed on the medication. . Do not take pain medication on an empty stomach. . You have been given a prescription for a blood thinner to prevent blood clots. Please take the medication as instructed. (NOTE: After completing a 2 week course of Lovenox, take one Enteric-coated aspirin once a day.) . Pain medications and iron supplements can cause constipation. Use a stool softener (Senokot or Colace) on a daily basis and a laxative (dulcolax or miralax) as needed. . Do not drive or drink alcoholic beverages when taking pain medications.  CALL THE OFFICE FOR: . Temperature above 101 degrees . Excessive bleeding or drainage on the dressing. . Excessive  swelling, coldness, or paleness of the toes. . Persistent nausea and vomiting.  FOLLOW-UP:  . You should have an appointment to return to the office in 6 weeks after surgery. . Arrangements have been made for continuation of Physical Therapy (either home therapy or outpatient therapy).     [facility] Department Directory         [email or web address]       [email or web address]          Cardiology  Appointments: [place] - [phone] [place] - [phone]  Endocrinology  Appointments: [place] - [phone] [place] - [phone]  Gastroenterology  Appointments: [place] - [phone] [place] - [phone]        General Surgery   Appointments: [place] - [phone]  Internal Medicine/Family Medicine  Appointments: [place] - [phone] [place] - [phone] [place] - [phone]  Metabolic and Weigh Loss Surgery  Appointments: [place] - [phone]        Neurology  Appointments: [place] - [phone] [place] - [phone]  Neurosurgery  Appointments: [place] - [phone]  Obstetrics & Gynecology  Appointments: [place] - [phone] [place] - [phone]        Pediatrics  Appointments: [place] - [phone] [place] - [phone]  Physiatry  Appointments: [place] [phone]  Physical Therapy  Appointments: [place] - [phone] [place] - [phone]        Podiatry  Appointments: [place] - [phone] [place] - [phone]  Pulmonology  Appointments: [place] - [phone]  Rheumatology  Appointments: [place] - [phone]        [place] Location: [facility]  [address] [place], [place]  [address]  [place] Location: [facility] [address] [place], [place]  [address]  [place] Location: [facility] [address] [place], [place]  [address]    

## 2019-12-24 DIAGNOSIS — M1612 Unilateral primary osteoarthritis, left hip: Secondary | ICD-10-CM | POA: Diagnosis not present

## 2019-12-27 ENCOUNTER — Encounter
Admission: RE | Admit: 2019-12-27 | Discharge: 2019-12-27 | Disposition: A | Discharge status: Home / Self Care | Payer: Medicare HMO | Source: Ambulatory Visit | Attending: Orthopedic Surgery | Admitting: Orthopedic Surgery

## 2019-12-27 ENCOUNTER — Other Ambulatory Visit: Payer: Self-pay

## 2019-12-27 DIAGNOSIS — Z01818 Encounter for other preprocedural examination: Secondary | ICD-10-CM | POA: Diagnosis not present

## 2019-12-27 DIAGNOSIS — Z20822 Contact with and (suspected) exposure to covid-19: Secondary | ICD-10-CM | POA: Diagnosis not present

## 2019-12-27 HISTORY — DX: Unspecified osteoarthritis, unspecified site: M19.90

## 2019-12-27 NOTE — Patient Instructions (Signed)
Your procedure is scheduled on:Wed, 3/17  Report to Day Surgery. To find out your arrival time please call [phone] between 1PM - 3PM on Tues 3/16.  Remember: Instructions that are not followed completely may result in serious medical risk,  up to and including death, or upon the discretion of your surgeon and anesthesiologist your  surgery may need to be rescheduled.     _X__ 1. Do not eat food after midnight the night before your procedure.                 No gum chewing or hard candies. You may drink clear liquids up to 2 hours                 before you are scheduled to arrive for your surgery- DO not drink clear                 liquids within 2 hours of the start of your surgery.                 Clear Liquids include:  water, apple juice without pulp, clear Gatorade, G2 or                  Gatorade Zero (avoid Red/Purple/Blue), Black Coffee or Tea (Do not add                 anything to coffee or tea). __x___2.   Complete the carbohydrate drink provided to you, 2 hours before arrival.  __X__2.  On the morning of surgery brush your teeth with toothpaste and water, you                may rinse your mouth with mouthwash if you wish.  Do not swallow any toothpaste of mouthwash.     ___ 3.  No Alcohol for 24 hours before or after surgery.   ___ 4.  Do Not Smoke or use e-cigarettes For 24 Hours Prior to Your Surgery.                 Do not use any chewable tobacco products for at least 6 hours prior to                 surgery.  ____  5.  Bring all medications with you on the day of surgery if instructed.   ___x_  6.  Notify your doctor if there is any change in your medical condition      (cold, fever, infections).     Do not wear jewelry, make-up, hairpins, clips or nail polish. Do not wear lotions, powders, or perfumes. You may wear deodorant. Do not shave 48 hours prior to surgery. Men may shave face and neck. Do not bring valuables to the hospital.     [facility] is not responsible for any belongings or valuables.  Contacts, dentures or bridgework may not be worn into surgery. Leave your suitcase in the car. After surgery it may be brought to your room. For patients admitted to the hospital, discharge time is determined by your treatment team.   Patients discharged the day of surgery will not be allowed to drive home.   Make arrangements for someone to be with you for the first 24 hours of your Same Day Discharge.    Please read over the following fact sheets that you were given:     __x__ Take these medicines the morning of surgery with A SIP OF WATER:  1. omeprazole (PRILOSEC) 20 MG capsule dose the night before and one the morning of surgery  2. metoprolol succinate (TOPROL-XL) 100 MG 24 hr tablet  3.   4.  5.  6.  ____ Fleet Enema (as directed)   _x___ Use CHG Soap (or wipes) as directed  ____ Use Benzoyl Peroxide Gel as instructed  ____ Use inhalers on the day of surgery  ____ Stop metformin 2 days prior to surgery    ____ Take 1/2 of usual insulin dose the night before surgery. No insulin the morning          of surgery.   __x__ Stopped aspirin on 3/9  __x__ Stopped Anti-inflammatories ibuprofen (ADVIL) 200 MG tablet on 3/9   Do not use aleve or BC powders       May take tylenol   __x__ Stop supplements until after surgery.  Ascorbic Acid (VITAMIN C) 1000 MG tablet,fish oil-omega-3 fatty acids 1000 MG capsule,Omega-3 Fatty Acids (FISH OIL PO),vitamin E 400 UNIT capsule  Stop Melatonin 3 days before surgery.  ____ Bring C-Pap to the hospital.

## 2019-12-30 ENCOUNTER — Other Ambulatory Visit: Payer: Self-pay

## 2019-12-30 ENCOUNTER — Encounter
Admission: RE | Admit: 2019-12-30 | Discharge: 2019-12-30 | Disposition: A | Discharge status: Home / Self Care | Payer: Medicare HMO | Source: Ambulatory Visit | Attending: Orthopedic Surgery | Admitting: Orthopedic Surgery

## 2019-12-30 ENCOUNTER — Other Ambulatory Visit: Payer: Medicare HMO

## 2019-12-30 DIAGNOSIS — Z01818 Encounter for other preprocedural examination: Secondary | ICD-10-CM | POA: Diagnosis not present

## 2019-12-30 DIAGNOSIS — Z20822 Contact with and (suspected) exposure to covid-19: Secondary | ICD-10-CM | POA: Diagnosis not present

## 2019-12-30 LAB — URINALYSIS, ROUTINE W REFLEX MICROSCOPIC
Bilirubin Urine: NEGATIVE
Glucose, UA: NEGATIVE mg/dL
Hgb urine dipstick: NEGATIVE
Ketones, ur: NEGATIVE mg/dL
Leukocytes,Ua: NEGATIVE
Nitrite: NEGATIVE
Protein, ur: NEGATIVE mg/dL
Specific Gravity, Urine: 1.013 (ref 1.005–1.030)
pH: 5 (ref 5.0–8.0)

## 2019-12-30 LAB — TYPE AND SCREEN
ABO/RH(D): O POS
Antibody Screen: NEGATIVE

## 2019-12-30 LAB — SEDIMENTATION RATE: Sed Rate: 19 mm/hr (ref 0–30)

## 2019-12-30 LAB — SURGICAL PCR SCREEN
MRSA, PCR: NEGATIVE
Staphylococcus aureus: POSITIVE — AB

## 2019-12-30 LAB — APTT: aPTT: 35 seconds (ref 24–36)

## 2019-12-30 LAB — SARS CORONAVIRUS 2 (TAT 6-24 HRS): SARS Coronavirus 2: NEGATIVE

## 2019-12-30 LAB — PROTIME-INR
INR: 1 (ref 0.8–1.2)
Prothrombin Time: 13.1 seconds (ref 11.4–15.2)

## 2019-12-30 LAB — C-REACTIVE PROTEIN: CRP: 1.3 mg/dL — ABNORMAL HIGH (ref <1.0)

## 2019-12-30 NOTE — Pre-Procedure Instructions (Signed)
Pre-Admit Testing Provider Notification Note  Provider Notified: Dr. Marry Guan  Notification Mode: Fax  Reason: PCR result  Response: Fax confirmation received.  Additional Information: Placed on chart. Noted on Pre-Admit Worksheet.  Signed: Beulah Gandy, RN

## 2019-12-31 LAB — URINE CULTURE
Culture: <10000 — AB
Special Requests: NORMAL

## 2019-12-31 NOTE — TOC Initial Note (Signed)
Transition of Care [facility]) - Initial/Assessment Note    Patient Details  Name: Casimira Camden MRN: [id number] Date of Birth: 04-23-1941  Transition of Care [facility]) CM/SW Contact:    Su Hilt, RN Phone Number: 12/31/2019, 3:11 PM  Clinical Narrative:                 Requested the price of Lovenox, will notify the patient once obtained       Patient Goals and CMS Choice        Expected Discharge Plan and Services                                                Prior Living Arrangements/Services                       Activities of Daily Living      Permission Sought/Granted                  Emotional Assessment              Admission diagnosis:  Primary osteoarthritis of left hip Patient Active Problem List   Diagnosis Date Noted  . TIA (transient ischemic attack) 03/13/2019  . 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

## 2020-01-01 ENCOUNTER — Inpatient Hospital Stay: Payer: Medicare HMO | Admitting: Registered Nurse

## 2020-01-01 ENCOUNTER — Encounter
Admission: RE | Disposition: A | Discharge status: Home Health | Payer: Self-pay | Source: Ambulatory Visit | Attending: Orthopedic Surgery

## 2020-01-01 ENCOUNTER — Other Ambulatory Visit: Payer: Self-pay

## 2020-01-01 ENCOUNTER — Inpatient Hospital Stay
Admission: RE | Admit: 2020-01-01 | Discharge: 2020-01-03 | DRG: 470 | Disposition: A | Discharge status: Home Health | Payer: Medicare HMO | Source: Ambulatory Visit | Attending: Orthopedic Surgery | Admitting: Orthopedic Surgery

## 2020-01-01 ENCOUNTER — Inpatient Hospital Stay: Payer: Medicare HMO

## 2020-01-01 ENCOUNTER — Encounter: Payer: Self-pay | Admitting: Orthopedic Surgery

## 2020-01-01 DIAGNOSIS — Z9049 Acquired absence of other specified parts of digestive tract: Secondary | ICD-10-CM

## 2020-01-01 DIAGNOSIS — Z888 Allergy status to other drugs, medicaments and biological substances status: Secondary | ICD-10-CM | POA: Diagnosis not present

## 2020-01-01 DIAGNOSIS — Z471 Aftercare following joint replacement surgery: Secondary | ICD-10-CM | POA: Diagnosis not present

## 2020-01-01 DIAGNOSIS — Z87442 Personal history of urinary calculi: Secondary | ICD-10-CM

## 2020-01-01 DIAGNOSIS — Z79899 Other long term (current) drug therapy: Secondary | ICD-10-CM

## 2020-01-01 DIAGNOSIS — M5416 Radiculopathy, lumbar region: Secondary | ICD-10-CM | POA: Diagnosis present

## 2020-01-01 DIAGNOSIS — E785 Hyperlipidemia, unspecified: Secondary | ICD-10-CM | POA: Diagnosis not present

## 2020-01-01 DIAGNOSIS — Z7982 Long term (current) use of aspirin: Secondary | ICD-10-CM

## 2020-01-01 DIAGNOSIS — H9190 Unspecified hearing loss, unspecified ear: Secondary | ICD-10-CM | POA: Diagnosis not present

## 2020-01-01 DIAGNOSIS — I10 Essential (primary) hypertension: Secondary | ICD-10-CM | POA: Diagnosis not present

## 2020-01-01 DIAGNOSIS — Z91040 Latex allergy status: Secondary | ICD-10-CM

## 2020-01-01 DIAGNOSIS — I739 Peripheral vascular disease, unspecified: Secondary | ICD-10-CM | POA: Diagnosis not present

## 2020-01-01 DIAGNOSIS — Z8673 Personal history of transient ischemic attack (TIA), and cerebral infarction without residual deficits: Secondary | ICD-10-CM | POA: Diagnosis not present

## 2020-01-01 DIAGNOSIS — Z8249 Family history of ischemic heart disease and other diseases of the circulatory system: Secondary | ICD-10-CM | POA: Diagnosis not present

## 2020-01-01 DIAGNOSIS — Z91048 Other nonmedicinal substance allergy status: Secondary | ICD-10-CM | POA: Diagnosis not present

## 2020-01-01 DIAGNOSIS — M1612 Unilateral primary osteoarthritis, left hip: Secondary | ICD-10-CM | POA: Diagnosis not present

## 2020-01-01 DIAGNOSIS — Z96649 Presence of unspecified artificial hip joint: Secondary | ICD-10-CM

## 2020-01-01 DIAGNOSIS — Z96642 Presence of left artificial hip joint: Secondary | ICD-10-CM

## 2020-01-01 DIAGNOSIS — Z20822 Contact with and (suspected) exposure to covid-19: Secondary | ICD-10-CM | POA: Diagnosis not present

## 2020-01-01 DIAGNOSIS — Z90721 Acquired absence of ovaries, unilateral: Secondary | ICD-10-CM | POA: Diagnosis not present

## 2020-01-01 DIAGNOSIS — K219 Gastro-esophageal reflux disease without esophagitis: Secondary | ICD-10-CM | POA: Diagnosis present

## 2020-01-01 HISTORY — PX: TOTAL HIP ARTHROPLASTY: SHX124

## 2020-01-01 LAB — LATEX, IGE: Latex: <0.1 kU/L

## 2020-01-01 SURGERY — ARTHROPLASTY, HIP, TOTAL,POSTERIOR APPROACH
Anesthesia: Spinal | Site: Hip | Laterality: Left

## 2020-01-01 MED ORDER — CELECOXIB 200 MG PO CAPS
ORAL_CAPSULE | ORAL | Status: AC
  Administered 2020-01-01: 12:00:00 400 mg via ORAL
  Filled 2020-01-01: qty 1

## 2020-01-01 MED ORDER — SODIUM CHLORIDE 0.9 % IV SOLN: INTRAVENOUS | Status: DC

## 2020-01-01 MED ORDER — NEOMYCIN-POLYMYXIN B GU 40-200000 IR SOLN
Status: DC | PRN
  Administered 2020-01-01: 14 mL

## 2020-01-01 MED ORDER — FENTANYL CITRATE (PF) 100 MCG/2ML IJ SOLN: 25.0000 ug | INTRAMUSCULAR | Status: DC | PRN

## 2020-01-01 MED ORDER — ENOXAPARIN SODIUM 30 MG/0.3ML ~~LOC~~ SOLN
30.0000 mg | Freq: Two times a day (BID) | SUBCUTANEOUS | Status: DC
  Administered 2020-01-02 – 2020-01-03 (×3): 30 mg via SUBCUTANEOUS
  Filled 2020-01-01 (×3): qty 0.3

## 2020-01-01 MED ORDER — SODIUM CHLORIDE 0.9 % IV SOLN
INTRAVENOUS | Status: DC | PRN
  Administered 2020-01-01: 50 ug/min via INTRAVENOUS

## 2020-01-01 MED ORDER — ENSURE PRE-SURGERY PO LIQD
296.0000 mL | Freq: Once | ORAL | Status: AC
  Administered 2020-01-01: 12:00:00 296 mL via ORAL
  Filled 2020-01-01: qty 296

## 2020-01-01 MED ORDER — SENNOSIDES-DOCUSATE SODIUM 8.6-50 MG PO TABS
1.0000 | ORAL_TABLET | Freq: Two times a day (BID) | ORAL | Status: DC
  Administered 2020-01-01 – 2020-01-03 (×4): 1 via ORAL
  Filled 2020-01-01 (×4): qty 1

## 2020-01-01 MED ORDER — ONDANSETRON HCL 4 MG/2ML IJ SOLN: 4.0000 mg | Freq: Four times a day (QID) | INTRAMUSCULAR | Status: DC | PRN

## 2020-01-01 MED ORDER — METOCLOPRAMIDE HCL 5 MG/ML IJ SOLN: 5.0000 mg | Freq: Three times a day (TID) | INTRAMUSCULAR | Status: DC | PRN

## 2020-01-01 MED ORDER — CELECOXIB 200 MG PO CAPS
200.0000 mg | ORAL_CAPSULE | Freq: Two times a day (BID) | ORAL | Status: DC
  Administered 2020-01-01 – 2020-01-03 (×4): 200 mg via ORAL
  Filled 2020-01-01 (×4): qty 1

## 2020-01-01 MED ORDER — TRANEXAMIC ACID-NACL 1000-0.7 MG/100ML-% IV SOLN
INTRAVENOUS | Status: AC
  Administered 2020-01-01: 1000 mg via INTRAVENOUS
  Filled 2020-01-01: qty 100

## 2020-01-01 MED ORDER — GABAPENTIN 300 MG PO CAPS
ORAL_CAPSULE | ORAL | Status: AC
  Administered 2020-01-01: 12:00:00 300 mg via ORAL
  Filled 2020-01-01: qty 1

## 2020-01-01 MED ORDER — ONDANSETRON HCL 4 MG PO TABS: 4.0000 mg | ORAL_TABLET | Freq: Four times a day (QID) | ORAL | Status: DC | PRN

## 2020-01-01 MED ORDER — ACETAMINOPHEN 10 MG/ML IV SOLN
INTRAVENOUS | Status: AC
  Filled 2020-01-01: qty 100

## 2020-01-01 MED ORDER — METOPROLOL SUCCINATE ER 50 MG PO TB24
100.0000 mg | ORAL_TABLET | Freq: Every day | ORAL | Status: DC
  Administered 2020-01-03: 09:00:00 100 mg via ORAL
  Filled 2020-01-01 (×2): qty 2

## 2020-01-01 MED ORDER — DIPHENHYDRAMINE HCL 12.5 MG/5ML PO ELIX: 12.5000 mg | ORAL_SOLUTION | ORAL | Status: DC | PRN

## 2020-01-01 MED ORDER — ADULT MULTIVITAMIN W/MINERALS CH
1.0000 | ORAL_TABLET | Freq: Every day | ORAL | Status: DC
  Administered 2020-01-01 – 2020-01-03 (×3): 1 via ORAL
  Filled 2020-01-01 (×3): qty 1

## 2020-01-01 MED ORDER — TRANEXAMIC ACID-NACL 1000-0.7 MG/100ML-% IV SOLN
INTRAVENOUS | Status: AC
  Filled 2020-01-01: qty 100

## 2020-01-01 MED ORDER — PANTOPRAZOLE SODIUM 40 MG PO TBEC
40.0000 mg | DELAYED_RELEASE_TABLET | Freq: Two times a day (BID) | ORAL | Status: DC
  Administered 2020-01-01 – 2020-01-03 (×4): 40 mg via ORAL
  Filled 2020-01-01 (×4): qty 1

## 2020-01-01 MED ORDER — PROPOFOL 500 MG/50ML IV EMUL
INTRAVENOUS | Status: DC | PRN
  Administered 2020-01-01: 75 ug/kg/min via INTRAVENOUS

## 2020-01-01 MED ORDER — PHENOL 1.4 % MT LIQD
1.0000 | OROMUCOSAL | Status: DC | PRN
  Filled 2020-01-01: qty 177

## 2020-01-01 MED ORDER — GLYCOPYRROLATE 0.2 MG/ML IJ SOLN
INTRAMUSCULAR | Status: AC
  Filled 2020-01-01: qty 1

## 2020-01-01 MED ORDER — EPHEDRINE SULFATE 50 MG/ML IJ SOLN
INTRAMUSCULAR | Status: DC | PRN
  Administered 2020-01-01: 10 mg via INTRAVENOUS

## 2020-01-01 MED ORDER — SALINE SPRAY 0.65 % NA SOLN
1.0000 | Freq: Four times a day (QID) | NASAL | Status: DC | PRN
  Filled 2020-01-01: qty 44

## 2020-01-01 MED ORDER — ACETAMINOPHEN 325 MG PO TABS: 325.0000 mg | ORAL_TABLET | Freq: Four times a day (QID) | ORAL | Status: DC | PRN

## 2020-01-01 MED ORDER — LOSARTAN POTASSIUM 50 MG PO TABS
100.0000 mg | ORAL_TABLET | Freq: Every day | ORAL | Status: DC
  Administered 2020-01-01 – 2020-01-03 (×2): 100 mg via ORAL
  Filled 2020-01-01 (×2): qty 2

## 2020-01-01 MED ORDER — FERROUS SULFATE 325 (65 FE) MG PO TABS
325.0000 mg | ORAL_TABLET | Freq: Two times a day (BID) | ORAL | Status: DC
  Administered 2020-01-02 – 2020-01-03 (×3): 325 mg via ORAL
  Filled 2020-01-01 (×3): qty 1

## 2020-01-01 MED ORDER — BISACODYL 10 MG RE SUPP: 10.0000 mg | Freq: Every day | RECTAL | Status: DC | PRN

## 2020-01-01 MED ORDER — CEFAZOLIN SODIUM-DEXTROSE 2-4 GM/100ML-% IV SOLN
2.0000 g | INTRAVENOUS | Status: AC
  Administered 2020-01-01: 13:00:00 2 g via INTRAVENOUS

## 2020-01-01 MED ORDER — OXYCODONE HCL 5 MG PO TABS
10.0000 mg | ORAL_TABLET | ORAL | Status: DC | PRN
  Administered 2020-01-01 – 2020-01-03 (×4): 10 mg via ORAL
  Filled 2020-01-01 (×4): qty 2

## 2020-01-01 MED ORDER — CEFAZOLIN SODIUM-DEXTROSE 2-4 GM/100ML-% IV SOLN
2.0000 g | Freq: Four times a day (QID) | INTRAVENOUS | Status: AC
  Administered 2020-01-01 – 2020-01-02 (×3): 2 g via INTRAVENOUS
  Filled 2020-01-01 (×4): qty 100

## 2020-01-01 MED ORDER — ALUM & MAG HYDROXIDE-SIMETH 200-200-20 MG/5ML PO SUSP
30.0000 mL | ORAL | Status: DC | PRN
  Administered 2020-01-02: 05:00:00 30 mL via ORAL
  Filled 2020-01-01: qty 30

## 2020-01-01 MED ORDER — PROPOFOL 10 MG/ML IV BOLUS
INTRAVENOUS | Status: AC
  Filled 2020-01-01: qty 80

## 2020-01-01 MED ORDER — DEXAMETHASONE SODIUM PHOSPHATE 10 MG/ML IJ SOLN: 8.0000 mg | Freq: Once | INTRAMUSCULAR | Status: AC

## 2020-01-01 MED ORDER — MAGNESIUM HYDROXIDE 400 MG/5ML PO SUSP
30.0000 mL | Freq: Every day | ORAL | Status: DC
  Administered 2020-01-01 – 2020-01-03 (×3): 30 mL via ORAL
  Filled 2020-01-01 (×3): qty 30

## 2020-01-01 MED ORDER — BUPIVACAINE HCL (PF) 0.5 % IJ SOLN
INTRAMUSCULAR | Status: DC | PRN
  Administered 2020-01-01: 3 mL

## 2020-01-01 MED ORDER — ONDANSETRON HCL 4 MG/2ML IJ SOLN: 4.0000 mg | Freq: Once | INTRAMUSCULAR | Status: DC | PRN

## 2020-01-01 MED ORDER — CHLORHEXIDINE GLUCONATE 4 % EX LIQD
60.0000 mL | Freq: Once | CUTANEOUS | Status: AC
  Administered 2020-01-01: 4 via TOPICAL

## 2020-01-01 MED ORDER — KETAMINE HCL 10 MG/ML IJ SOLN
INTRAMUSCULAR | Status: DC | PRN
  Administered 2020-01-01: 20 mg via INTRAVENOUS
  Administered 2020-01-01 (×3): 10 mg via INTRAVENOUS

## 2020-01-01 MED ORDER — LIDOCAINE HCL (CARDIAC) PF 100 MG/5ML IV SOSY
PREFILLED_SYRINGE | INTRAVENOUS | Status: DC | PRN
  Administered 2020-01-01: 20 mg via INTRAVENOUS

## 2020-01-01 MED ORDER — CELECOXIB 200 MG PO CAPS: 400.0000 mg | ORAL_CAPSULE | Freq: Once | ORAL | Status: AC

## 2020-01-01 MED ORDER — OMEGA-3-ACID ETHYL ESTERS 1 G PO CAPS
1000.0000 mg | ORAL_CAPSULE | Freq: Every day | ORAL | Status: DC
  Administered 2020-01-01 – 2020-01-03 (×3): 1000 mg via ORAL
  Filled 2020-01-01 (×3): qty 1

## 2020-01-01 MED ORDER — HYDROMORPHONE HCL 1 MG/ML IJ SOLN: 0.5000 mg | INTRAMUSCULAR | Status: DC | PRN

## 2020-01-01 MED ORDER — TRANEXAMIC ACID-NACL 1000-0.7 MG/100ML-% IV SOLN
1000.0000 mg | INTRAVENOUS | Status: AC
  Administered 2020-01-01: 13:00:00 1000 mg via INTRAVENOUS

## 2020-01-01 MED ORDER — DEXAMETHASONE SODIUM PHOSPHATE 10 MG/ML IJ SOLN
INTRAMUSCULAR | Status: AC
  Administered 2020-01-01: 12:00:00 8 mg via INTRAVENOUS
  Filled 2020-01-01: qty 1

## 2020-01-01 MED ORDER — ACETAMINOPHEN 10 MG/ML IV SOLN
1000.0000 mg | Freq: Four times a day (QID) | INTRAVENOUS | Status: AC
  Administered 2020-01-01 – 2020-01-02 (×4): 1000 mg via INTRAVENOUS
  Filled 2020-01-01 (×4): qty 100

## 2020-01-01 MED ORDER — TRAMADOL HCL 50 MG PO TABS: 50.0000 mg | ORAL_TABLET | ORAL | Status: DC | PRN

## 2020-01-01 MED ORDER — FENTANYL CITRATE (PF) 100 MCG/2ML IJ SOLN
INTRAMUSCULAR | Status: AC
  Administered 2020-01-01: 18:00:00 25 ug via INTRAVENOUS
  Filled 2020-01-01: qty 2

## 2020-01-01 MED ORDER — PROPOFOL 10 MG/ML IV BOLUS
INTRAVENOUS | Status: DC | PRN
  Administered 2020-01-01: 30 mg via INTRAVENOUS
  Administered 2020-01-01: 20 mg via INTRAVENOUS
  Administered 2020-01-01: 30 mg via INTRAVENOUS

## 2020-01-01 MED ORDER — VITAMIN D 25 MCG (1000 UNIT) PO TABS
1000.0000 [IU] | ORAL_TABLET | Freq: Two times a day (BID) | ORAL | Status: DC
  Administered 2020-01-01 – 2020-01-03 (×4): 1000 [IU] via ORAL
  Filled 2020-01-01 (×4): qty 1

## 2020-01-01 MED ORDER — MENTHOL 3 MG MT LOZG
1.0000 | LOZENGE | OROMUCOSAL | Status: DC | PRN
  Filled 2020-01-01: qty 9

## 2020-01-01 MED ORDER — FLEET ENEMA 7-19 GM/118ML RE ENEM: 1.0000 | ENEMA | Freq: Once | RECTAL | Status: DC | PRN

## 2020-01-01 MED ORDER — SODIUM CHLORIDE (PF) 0.9 % IJ SOLN
INTRAMUSCULAR | Status: AC
  Filled 2020-01-01: qty 10

## 2020-01-01 MED ORDER — BUPIVACAINE HCL (PF) 0.5 % IJ SOLN
INTRAMUSCULAR | Status: AC
  Filled 2020-01-01: qty 10

## 2020-01-01 MED ORDER — CEFAZOLIN SODIUM-DEXTROSE 2-4 GM/100ML-% IV SOLN
INTRAVENOUS | Status: AC
  Administered 2020-01-02: 11:00:00 2 g via INTRAVENOUS
  Filled 2020-01-01: qty 100

## 2020-01-01 MED ORDER — GABAPENTIN 300 MG PO CAPS
300.0000 mg | ORAL_CAPSULE | Freq: Every day | ORAL | Status: DC
  Administered 2020-01-01 – 2020-01-02 (×2): 300 mg via ORAL
  Filled 2020-01-01 (×2): qty 1

## 2020-01-01 MED ORDER — PROPOFOL 500 MG/50ML IV EMUL
INTRAVENOUS | Status: AC
  Filled 2020-01-01: qty 100

## 2020-01-01 MED ORDER — PHENYLEPHRINE HCL (PRESSORS) 10 MG/ML IV SOLN
INTRAVENOUS | Status: DC | PRN
  Administered 2020-01-01: 100 ug via INTRAVENOUS

## 2020-01-01 MED ORDER — ROCURONIUM BROMIDE 10 MG/ML (PF) SYRINGE
PREFILLED_SYRINGE | INTRAVENOUS | Status: AC
  Filled 2020-01-01: qty 10

## 2020-01-01 MED ORDER — CELECOXIB 200 MG PO CAPS
ORAL_CAPSULE | ORAL | Status: AC
  Filled 2020-01-01: qty 1

## 2020-01-01 MED ORDER — ACETAMINOPHEN 10 MG/ML IV SOLN
INTRAVENOUS | Status: DC | PRN
  Administered 2020-01-01: 1000 mg via INTRAVENOUS

## 2020-01-01 MED ORDER — TRANEXAMIC ACID-NACL 1000-0.7 MG/100ML-% IV SOLN: 1000.0000 mg | Freq: Once | INTRAVENOUS | Status: AC

## 2020-01-01 MED ORDER — LACTATED RINGERS IV SOLN: INTRAVENOUS | Status: DC

## 2020-01-01 MED ORDER — METOCLOPRAMIDE HCL 10 MG PO TABS
10.0000 mg | ORAL_TABLET | Freq: Three times a day (TID) | ORAL | Status: DC
  Administered 2020-01-01 – 2020-01-03 (×7): 10 mg via ORAL
  Filled 2020-01-01 (×7): qty 1

## 2020-01-01 MED ORDER — OXYCODONE HCL 5 MG PO TABS
5.0000 mg | ORAL_TABLET | ORAL | Status: DC | PRN
  Administered 2020-01-02: 14:00:00 5 mg via ORAL
  Filled 2020-01-01: qty 1

## 2020-01-01 MED ORDER — GLYCOPYRROLATE 0.2 MG/ML IJ SOLN
INTRAMUSCULAR | Status: DC | PRN
  Administered 2020-01-01: .2 mg via INTRAVENOUS

## 2020-01-01 MED ORDER — VITAMIN E 180 MG (400 UNIT) PO CAPS
400.0000 [IU] | ORAL_CAPSULE | Freq: Every day | ORAL | Status: DC
  Administered 2020-01-01 – 2020-01-02 (×2): 400 [IU] via ORAL
  Filled 2020-01-01 (×3): qty 1

## 2020-01-01 MED ORDER — METOCLOPRAMIDE HCL 10 MG PO TABS: 5.0000 mg | ORAL_TABLET | Freq: Three times a day (TID) | ORAL | Status: DC | PRN

## 2020-01-01 MED ORDER — EPHEDRINE 5 MG/ML INJ
INTRAVENOUS | Status: AC
  Filled 2020-01-01: qty 10

## 2020-01-01 MED ORDER — ASCORBIC ACID 500 MG PO TABS
1000.0000 mg | ORAL_TABLET | Freq: Every evening | ORAL | Status: DC
  Administered 2020-01-01 – 2020-01-02 (×2): 1000 mg via ORAL
  Filled 2020-01-01 (×2): qty 2

## 2020-01-01 MED ORDER — GABAPENTIN 300 MG PO CAPS: 300.0000 mg | ORAL_CAPSULE | Freq: Once | ORAL | Status: AC

## 2020-01-01 MED ORDER — POLYVINYL ALCOHOL 1.4 % OP SOLN
1.0000 [drp] | Freq: Three times a day (TID) | OPHTHALMIC | Status: DC | PRN
  Filled 2020-01-01: qty 15

## 2020-01-01 SURGICAL SUPPLY — 60 items
BLADE DRUM FLTD (BLADE) ×2 IMPLANT
BLADE SAW 90X25X1.19 OSCILLAT (BLADE) ×2 IMPLANT
CANISTER SUCT 1200ML W/VALVE (MISCELLANEOUS) ×2 IMPLANT
CANISTER SUCT 3000ML PPV (MISCELLANEOUS) ×4 IMPLANT
CARTRIDGE OIL MAESTRO DRILL (MISCELLANEOUS) ×1 IMPLANT
COVER WAND RF STERILE (DRAPES) ×2 IMPLANT
CUP ACETBLR 52 OD 100 SERIES (Hips) ×1 IMPLANT
DIFFUSER DRILL AIR PNEUMATIC (MISCELLANEOUS) ×2 IMPLANT
DRAPE 3/4 80X56 (DRAPES) ×2 IMPLANT
DRAPE INCISE IOBAN 66X60 STRL (DRAPES) ×2 IMPLANT
DRSG DERMACEA 8X12 NADH (GAUZE/BANDAGES/DRESSINGS) ×2 IMPLANT
DRSG OPSITE POSTOP 4X12 (GAUZE/BANDAGES/DRESSINGS) ×1 IMPLANT
DRSG OPSITE POSTOP 4X14 (GAUZE/BANDAGES/DRESSINGS) ×1 IMPLANT
DRSG TEGADERM 4X4.75 (GAUZE/BANDAGES/DRESSINGS) ×2 IMPLANT
DURAPREP 26ML APPLICATOR (WOUND CARE) ×3 IMPLANT
ELECT REM PT RETURN 9FT ADLT (ELECTROSURGICAL) ×2
ELECTRODE REM PT RTRN 9FT ADLT (ELECTROSURGICAL) ×1 IMPLANT
GLOVE BIO SURGEON STRL SZ7.5 (GLOVE) ×4 IMPLANT
GLOVE BIOGEL M STRL SZ7.5 (GLOVE) ×4 IMPLANT
GLOVE BIOGEL PI IND STRL 7.5 (GLOVE) ×1 IMPLANT
GLOVE BIOGEL PI INDICATOR 7.5 (GLOVE) ×1
GLOVE INDICATOR 8.0 STRL GRN (GLOVE) ×2 IMPLANT
GOWN STRL REUS W/ TWL LRG LVL3 (GOWN DISPOSABLE) ×2 IMPLANT
GOWN STRL REUS W/ TWL XL LVL3 (GOWN DISPOSABLE) ×1 IMPLANT
GOWN STRL REUS W/TWL LRG LVL3 (GOWN DISPOSABLE) ×2
GOWN STRL REUS W/TWL XL LVL3 (GOWN DISPOSABLE) ×1
HEAD M SROM 36MM PLUS 1.5 (Hips) IMPLANT
HEMOVAC 400CC 10FR (MISCELLANEOUS) ×2 IMPLANT
HOLDER FOLEY CATH W/STRAP (MISCELLANEOUS) ×2 IMPLANT
HOOD PEEL AWAY FLYTE STAYCOOL (MISCELLANEOUS) ×4 IMPLANT
KIT TURNOVER KIT A (KITS) ×2 IMPLANT
LINER MARATHON 4MM 10DEG 36X52 (Hips) ×1 IMPLANT
MANIFOLD NEPTUNE II (INSTRUMENTS) ×2 IMPLANT
NDL SAFETY ECLIPSE 18X1.5 (NEEDLE) ×1 IMPLANT
NEEDLE HYPO 18GX1.5 SHARP (NEEDLE) ×1
NS IRRIG 500ML POUR BTL (IV SOLUTION) ×2 IMPLANT
OIL CARTRIDGE MAESTRO DRILL (MISCELLANEOUS) ×2
PACK HIP PROSTHESIS (MISCELLANEOUS) ×2 IMPLANT
PENCIL SMOKE ULTRAEVAC 22 CON (MISCELLANEOUS) ×2 IMPLANT
PIN STEIN THRED 5/32 (Pin) ×2 IMPLANT
PULSAVAC PLUS IRRIG FAN TIP (DISPOSABLE) ×2
SOL .9 NS 3000ML IRR  AL (IV SOLUTION) ×1
SOL .9 NS 3000ML IRR UROMATIC (IV SOLUTION) ×1 IMPLANT
SOL PREP PVP 2OZ (MISCELLANEOUS) ×2
SOLUTION PREP PVP 2OZ (MISCELLANEOUS) ×1 IMPLANT
SPONGE DRAIN TRACH 4X4 STRL 2S (GAUZE/BANDAGES/DRESSINGS) ×2 IMPLANT
SROM M HEAD 36MM PLUS 1.5 (Hips) ×2 IMPLANT
STAPLER SKIN PROX 35W (STAPLE) ×2 IMPLANT
STEM AML 12X155X30 STD SM 6IN (Joint) ×1 IMPLANT
SUT ETHIBOND #5 BRAIDED 30INL (SUTURE) ×3 IMPLANT
SUT VIC AB 0 CT1 36 (SUTURE) ×3 IMPLANT
SUT VIC AB 1 CT1 36 (SUTURE) ×4 IMPLANT
SUT VIC AB 2-0 CT1 27 (SUTURE) ×1
SUT VIC AB 2-0 CT1 TAPERPNT 27 (SUTURE) ×1 IMPLANT
SYR 20ML LL LF (SYRINGE) ×2 IMPLANT
TAPE CLOTH 3X10 WHT NS LF (GAUZE/BANDAGES/DRESSINGS) ×2 IMPLANT
TAPE TRANSPORE STRL 2 31045 (GAUZE/BANDAGES/DRESSINGS) ×2 IMPLANT
TIP FAN IRRIG PULSAVAC PLUS (DISPOSABLE) ×1 IMPLANT
TOWEL OR 17X26 4PK STRL BLUE (TOWEL DISPOSABLE) ×2 IMPLANT
TRAY FOLEY MTR SLVR 16FR STAT (SET/KITS/TRAYS/PACK) ×2 IMPLANT

## 2020-01-01 NOTE — Anesthesia Preprocedure Evaluation (Signed)
Anesthesia Evaluation  Patient identified by MRN, date of birth, ID band Patient awake    Reviewed: Allergy & Precautions, H&P , NPO status , Patient's Chart, lab work & pertinent test results, reviewed documented beta blocker date and time   Airway Mallampati: II   Neck ROM: full    Dental  (+) Poor Dentition   Pulmonary neg pulmonary ROS,    Pulmonary exam normal        Cardiovascular Exercise Tolerance: Poor hypertension, On Medications + Peripheral Vascular Disease  Normal cardiovascular exam Rhythm:regular Rate:Normal     Neuro/Psych TIACVA negative psych ROS   GI/Hepatic Neg liver ROS, GERD  Medicated,  Endo/Other  negative endocrine ROS  Renal/GU Renal diseasestones  negative genitourinary   Musculoskeletal  (+) Arthritis , Osteoarthritis,    Abdominal   Peds negative pediatric ROS (+)  Hematology negative hematology ROS (+)   Anesthesia Other Findings Past Medical History: No date: GERD (gastroesophageal reflux disease) No date: History of kidney stones No date: Hyperlipidemia No date: Hypertension 2014: Kidney stones Past Surgical History: 1963: APPENDECTOMY 2010: COLONOSCOPY     Comment:  Dr. Candace Cruise No date: EXTERNAL EAR SURGERY; Left     Comment:  1970's 1970: FOOT SURGERY; Left 1963: OVARY SURGERY     Comment:  due to cyst rupture No date: WISDOM TOOTH EXTRACTION BMI    Body Mass Index:  33.84 kg/m     Reproductive/Obstetrics negative OB ROS                             Anesthesia Physical  Anesthesia Plan  ASA: III  Anesthesia Plan: Spinal   Post-op Pain Management:    Induction:   PONV Risk Score and Plan:   Airway Management Planned: Nasal Cannula  Additional Equipment:   Intra-op Plan:   Post-operative Plan:   Informed Consent: I have reviewed the patients History and Physical, chart, labs and discussed the procedure including the risks,  benefits and alternatives for the proposed anesthesia with the patient or authorized representative who has indicated his/her understanding and acceptance.     Dental Advisory Given  Plan Discussed with: CRNA  Anesthesia Plan Comments:         Anesthesia Quick Evaluation

## 2020-01-01 NOTE — TOC Benefit Eligibility Note (Signed)
Transition of Care [facility]) Benefit Eligibility Note    Patient Details  Name: Dawn Vega MRN: [id number] Date of Birth: [birth year]   Spoke with Person/Company/Phone Number:: [facility] Medicare    Additional Notes: Prescriptions policy was cancelled [id number]    [place] Phone Number: 01/01/2020, 9:50 AM

## 2020-01-01 NOTE — H&P (Signed)
ORTHOPAEDIC HISTORY & PHYSICAL Progress Notes by Gwenlyn Fudge, PA at 12/24/2019 2:15 PM  [facility] Chief Complaint:       Chief Complaint  Patient presents with  . Pre-op Exam    Left THA 3.17.21    History of Present Illness:    Dawn Vega is a 79 y.o. female that presents to clinic today for her preoperative history and evaluation.  Patient presents with her son. The patient is scheduled to undergo a left total hip arthroplasty on 01/01/20 by Dr. Marry Guan. Her pain began approximately 1 year ago.  The pain is located over the left hip and groin.  She describes her pain as worse with weightbearing.  She reports associated increase in pain when rising from a seated position or getting into and out of the car.  She denies associated numbness or tingling.   The patient's symptoms have progressed to the point that they decrease her quality of life. The patient has previously undergone conservative treatment including NSAIDS and activity modification without adequate control of her symptoms.  Of note patient does have a history of lumbar radiculitis and has received lumbar epidural steroid injections in the past.  Patient also reports a history of TIA at the end of April for which she takes daily 325 mg aspirin.  Patient sees Dr. Ubaldo Glassing in cardiology.  Patient reports a latex allergy.    Past Medical, Surgical, Family, Social History, Allergies, Medications:   Past Medical History:      Past Medical History:  Diagnosis Date  . GERD (gastroesophageal reflux disease)    ultrasound on 7/14 unremarkable. HIDA with CCK shows reduced gallbladder ejection fraction at 19%  . Hearing loss 04/30/2014  . Hemorrhoids   . Hyperlipidemia   . Hypertension   . Osteoarthrosis   . Otosclerosis    with a prior left ear surgery  . Ovarian cyst   . Personal history of kidney stones   . Plantar fasciitis   . Tremor 04/30/2014     Past Surgical History:       Past Surgical History:  Procedure Laterality Date  . APPENDECTOMY    . Bunionectomy    . CHOLECYSTECTOMY     5/15 by Dr. Tamala Julian  . DILATION AND CURETTAGE OF UTERUS    . ear surgery Left   . LAPAROSCOPIC CHOLECYSTECTOMY  2015   by Dr. Tamala Julian at [facility] Left    Due to ovarian cyst left ovary removed at this time appendectomy as well.   . Wisdom teeth      Current Medications:  Current Medications        Current Outpatient Medications  Medication Sig Dispense Refill  . aspirin 325 MG EC tablet Take by mouth once daily       . chlorpheniramine (CHLOR-TRIMETON) 12 mg ER tablet Take 4 mg by mouth every 12 (twelve) hours as needed Reported on 01/05/2016     . cholecalciferol (CHOLECALCIFEROL) 1,000 unit tablet Take by mouth 2 (two) times daily.    Marland Kitchen docosahexanoic acid/epa (FISH OIL ORAL) Take 1,000 mg by mouth once daily       . losartan (COZAAR) 100 MG tablet TAKE 1 TABLET EVERY DAY 90 tablet 3  . metoprolol succinate (TOPROL-XL) 100 MG XL tablet Take 1 tablet (100 mg total) by mouth once daily 90 tablet 4  . MULTIVITAMIN ORAL Take by mouth.    Marland Kitchen omeprazole (PRILOSEC) 20 MG DR  capsule Take 1 capsule (20 mg total) by mouth once daily 90 capsule 4  . vitamin E 400 UNIT capsule Take 400 Units by mouth once daily.     No current facility-administered medications for this visit.       Allergies:       Allergies  Allergen Reactions  . Ace Inhibitors Cough  . Amlodipine Other (See Comments)    Constipation, edema  . Calcium Other (See Comments)    intolerance  . Crestor [Rosuvastatin] Muscle Pain  . Diclofenac Potassium Other (See Comments)    Intolerance   . Hydrochlorothiazide Muscle Pain    Bilateral hand tingling  . Latex Dermatitis  . Lipitor [Atorvastatin] Unknown  . Pravastatin Muscle Pain  . Adhesive Tape-Silicones Rash    Social History:  Social History   Social History        Socioeconomic History  . Marital status: Married    Spouse name: Not on file  . Number of children: Not on file  . Years of education: Not on file  . Highest education level: Not on file  Occupational History  . Not on file  Social Needs  . Financial resource strain: Not on file  . Food insecurity    Worry: Not on file    Inability: Not on file  . Transportation needs    Medical: Not on file    Non-medical: Not on file  Tobacco Use  . Smoking status: Never Smoker  . Smokeless tobacco: Never Used  Substance and Sexual Activity  . Alcohol use: No    Alcohol/week: 0.0 standard drinks  . Drug use: No  . Sexual activity: Not Currently    Partners: Male    Birth control/protection: Post-menopausal  Lifestyle  . Physical activity    Days per week: Not on file    Minutes per session: Not on file  . Stress: Not on file  Relationships  . Social Herbalist on phone: Not on file    Gets together: Not on file    Attends religious service: Not on file    Active member of club or organization: Not on file    Attends meetings of clubs or organizations: Not on file    Relationship status: Not on file  Other Topics Concern  . Not on file  Social History Narrative  . Not on file      Family History:       Family History  Problem Relation Age of Onset  . Myocardial Infarction (Heart attack) Mother   . High blood pressure (Hypertension) Mother   . Dementia Mother   . Prostate cancer Father   . Mental illness Father   . Lung cancer Father   . Colon polyps Sister   . Myocardial Infarction (Heart attack) Other   . High blood pressure (Hypertension) Other   . Breast cancer Other   . Colon cancer Other     Review of Systems:   A 10+ ROS was performed, reviewed, and the pertinent orthopaedic findings are documented in the HPI.    Physical Examination:   BP 132/82 (BP Location: Left upper  arm, Patient Position: Sitting, BP Cuff Size: Adult)   Ht 157.5 cm (5\' 2" )   Wt 83.6 kg (184 lb 6.4 oz)   BMI 33.73 kg/m   Patient is a well-developed, well-nourished female in no acute distress. Patient has normal mood and affect. Patient is alert and oriented to person, place, and time.  HEENT: Atraumatic, normocephalic.  Pupils equal and reactive to light.  Extraocular motion intact.  Noninjected sclera.  Cardiovascular: Regular rate and rhythm, with no murmurs, rubs, or gallops.  Distal pulses palpable.  Respiratory: Lungs clear to auscultation bilaterally.   LeftHip: Pelvic tilt:Negative Limb lengths:Equal with the patient standing Soft tissue swelling:Negative Erythema:Negative Crepitance:Negative Tenderness:Greater trochanter is nontender to palpation. Moderatepain is elicited by axial compression or extremes of rotation. Atrophy:No atrophy. Fair to goodhip flexor and abductor strength. Range of Motion:EXT/FLEX:0/0/100ADD/ABD: 20/0/20IR/ER: 0/0/20  Sensation is intact over the saphenous, lateral cutaneous, superficial fibular, and deep fibular nerve distributions.  Tests Performed/Reviewed:  X-rays  Anteroposterior view of the pelvis and anteroposterior and lateral views of the left hip were obtained.  Images reveal complete loss of femoral acetabular joint space with associated femoral head osteophyte formation and subchondral sclerosis of the bone.  No fractures noted.   Impression:     ICD-10-CM  1. Primary osteoarthritis of left hip  M16.12      Plan:   The patient has end-stage degenerative changes of the left hip.  It was explained to the patient that the condition is progressive in nature.  Having failed conservative treatment, the patient has elected to proceed with a total joint  arthroplasty.  The patient will undergo a total joint arthroplasty with Dr. Marry Guan.  The risks of surgery, including blood clot and infection, were discussed with the patient.  Measures to reduce these risks, including the use of anticoagulation, perioperative antibiotics, and early ambulation were discussed.  The importance of postoperative physical therapy was discussed with the patient. The patient elects to proceed with surgery. The patient is instructed to stop all blood thinners prior to surgery.  The patient is instructed to call the hospital the day before surgery to learn of the proper arrival time.    Contact our office with any questions or concerns.  Follow up as indicated, or sooner should any new problems arise, if conditions worsen, or if they are otherwise concerned.   Gwenlyn Fudge, PA [facility] [place],  [address] Phone: [phone]  This note was generated in part with voice recognition software and I apologize for any typographical errors that were not detected and corrected.     Electronically signed by Gwenlyn Fudge, [place] on 12/24/2019 7:19 PM

## 2020-01-01 NOTE — Progress Notes (Signed)
Patients cath was discontinued right before arrival to PACU  Did not check bladder  Would not have any to count

## 2020-01-01 NOTE — Transfer of Care (Signed)
Immediate Anesthesia Transfer of Care Note  Patient: Dawn Vega  Procedure(s) Performed: TOTAL HIP ARTHROPLASTY (Left Hip)  Patient Location: PACU  Anesthesia Type:General  Level of Consciousness: drowsy  Airway & Oxygen Therapy: Patient Spontanous Breathing and Patient connected to face mask oxygen  Post-op Assessment: Report given to RN and Post -op Vital signs reviewed and stable  Post vital signs: Reviewed and stable  Last Vitals:  Vitals Value Taken Time  BP 158/131 01/01/20 1652  Temp 97.4 F   Pulse 86 01/01/20 1653  Resp 14 01/01/20 1653  SpO2 100 % 01/01/20 1653  Vitals shown include unvalidated device data.  Last Pain:  Vitals:   01/01/20 1156  TempSrc: Temporal  PainSc: 0-No pain         Complications: No apparent anesthesia complications

## 2020-01-01 NOTE — Anesthesia Postprocedure Evaluation (Signed)
Anesthesia Post Note  Patient: Dawn Vega  Procedure(s) Performed: TOTAL HIP ARTHROPLASTY (Left Hip)  Patient location during evaluation: PACU Anesthesia Type: Combined General/Spinal Level of consciousness: awake and alert Pain management: pain level controlled Vital Signs Assessment: post-procedure vital signs reviewed and stable Respiratory status: spontaneous breathing, nonlabored ventilation, respiratory function stable and patient connected to nasal cannula oxygen Cardiovascular status: blood pressure returned to baseline and stable Postop Assessment: no apparent nausea or vomiting and spinal receding Anesthetic complications: no     Last Vitals:  Vitals:   01/01/20 1736 01/01/20 1742  BP:  (!) 153/81  Pulse: 80 79  Resp: (!) 7 18  Temp:  (!) 36 C  SpO2: 100% 97%    Last Pain:  Vitals:   01/01/20 1742  TempSrc:   PainSc: 3                  Arita Miss

## 2020-01-01 NOTE — Op Note (Signed)
OPERATIVE NOTE  DATE OF SURGERY:  01/01/2020  PATIENT NAME:  Carlon Trolinger   DOB: [birth year]  MRN: [id number]  PRE-OPERATIVE DIAGNOSIS: Degenerative arthrosis of the left hip, primary  POST-OPERATIVE DIAGNOSIS:  Same  PROCEDURE:  Left total hip arthroplasty  SURGEON:  Marciano Sequin. M.D.  ASSISTANT: Cassell Smiles, PA-C (present and scrubbed throughout the case, critical for assistance with exposure, retraction, instrumentation, and closure)  ANESTHESIA: spinal  ESTIMATED BLOOD LOSS: 200 mL  FLUIDS REPLACED: 1500 mL of crystalloid  DRAINS: 2 medium drains to a Hemovac reservoir  IMPLANTS UTILIZED: DePuy 12 mm small stature AML femoral stem, 52 mm OD Pinnacle 100 acetabular component, +4 mm 10 degree Pinnacle Marathon polyethylene insert, and a 36 mm M-SPEC +1.5 mm hip ball  INDICATIONS FOR SURGERY: Zaylynn Asmar is a 79 y.o. year old female with a long history of progressive hip and groin  pain. X-rays demonstrated severe degenerative changes. The patient had not seen any significant improvement despite conservative nonsurgical intervention. After discussion of the risks and benefits of surgical intervention, the patient expressed understanding of the risks benefits and agree with plans for total hip arthroplasty.   The risks, benefits, and alternatives were discussed at length including but not limited to the risks of infection, bleeding, nerve injury, stiffness, blood clots, the need for revision surgery, limb length inequality, dislocation, cardiopulmonary complications, among others, and they were willing to proceed.  PROCEDURE IN DETAIL: The patient was brought into the operating room and, after adequate spinal anesthesia was achieved, the patient was placed in a right lateral decubitus position. Axillary roll was placed and all bony prominences were well-padded. The patient's left hip was cleaned and prepped with alcohol and DuraPrep and draped in the usual sterile  fashion. A "timeout" was performed as per usual protocol. A lateral curvilinear incision was made gently curving towards the posterior superior iliac spine. The IT band was incised in line with the skin incision and the fibers of the gluteus maximus were split in line. The piriformis tendon was identified, skeletonized, and incised at its insertion to the proximal femur and reflected posteriorly. A T type posterior capsulotomy was performed. Prior to dislocation of the femoral head, a threaded Steinmann pin was inserted through a separate stab incision into the pelvis superior to the acetabulum and bent in the form of a stylus so as to assess limb length and hip offset throughout the procedure. The femoral head was then dislocated posteriorly. Inspection of the femoral head demonstrated severe degenerative changes with full-thickness loss of articular cartilage. The femoral neck cut was performed using an oscillating saw. The anterior capsule was elevated off of the femoral neck using a periosteal elevator. Attention was then directed to the acetabulum. The remnant of the labrum was excised using electrocautery. Inspection of the acetabulum also demonstrated significant degenerative changes. The acetabulum was reamed in sequential fashion up to a 51 mm diameter. Good punctate bleeding bone was encountered. A 52 mm Pinnacle 100 acetabular component was positioned and impacted into place. Good scratch fit was appreciated. A +4 mm neutral polyethylene trial was inserted.  Attention was then directed to the proximal femur. A hole for reaming of the proximal femoral canal was created using a high-speed burr. The femoral canal was reamed in sequential fashion up to a 11.5 mm diameter. This allowed for approximately 7 cm of scratch fit.  It was thus elected to ream up to a 12 mm diameter to allow for a line to  line fit.  Serial broaches were inserted up to a 12 mm small stature femoral broach. Calcar region was planed  and a trial reduction was performed using a 36 mm hip ball with a +1.5 mm neck length.  Reasonably good stability was noted but it was elected to trial with a +4 mm 10 degree liner with a high side positioned at the 4 o'clock position.  Good equalization of limb lengths and hip offset was appreciated and excellent stability was noted both anteriorly and posteriorly. Trial components were removed. The acetabular shell was irrigated with copious amounts of normal saline with antibiotic solution and suctioned dry. A +4 mm 10 degree Pinnacle Marathon polyethylene insert was positioned with the high side at the 4 o'clock position and impacted into place. Next, a 12 mm small stature AML femoral stem was positioned and impacted into place. Excellent scratch fit was appreciated. A trial reduction was again performed with a 36 mm hip ball with a +1.5 mm neck length. Again, good equalization of limb lengths was appreciated and excellent stability appreciated both anteriorly and posteriorly. The hip was then dislocated and the trial hip ball was removed. The Morse taper was cleaned and dried. A 36 mm M-SPEC hip ball with a +1.5 mm neck length was placed on the trunnion and impacted into place. The hip was then reduced and placed through range of motion. Excellent stability was appreciated both anteriorly and posteriorly.  The wound was irrigated with copious amounts of normal saline with antibiotic solution and suctioned dry. Good hemostasis was appreciated. The posterior capsulotomy was repaired using #5 Ethibond. Piriformis tendon was reapproximated to the undersurface of the gluteus medius tendon using #5 Ethibond. Two medium drains were placed in the wound bed and brought out through separate stab incisions to be attached to a Hemovac reservoir. The IT band was reapproximated using interrupted sutures of #1 Vicryl. Subcutaneous tissue was approximated using first #0 Vicryl followed by #2-0 Vicryl. The skin was closed  with skin staples.  The patient tolerated the procedure well and was transported to the recovery room in stable condition.   Marciano Sequin., M.D.

## 2020-01-01 NOTE — H&P (Signed)
The patient has been re-examined, and the chart reviewed, and there have been no interval changes to the documented history and physical.    The risks, benefits, and alternatives have been discussed at length. The patient expressed understanding of the risks benefits and agreed with plans for surgical intervention.  Dawn Vega P. Nicholaus Steinke, Jr. M.D.    

## 2020-01-01 NOTE — Anesthesia Procedure Notes (Signed)
Spinal  Patient location during procedure: OR Start time: 01/01/2020 12:55 PM End time: 01/01/2020 1:13 PM Staffing Performed: anesthesiologist and resident/CRNA  Anesthesiologist: Alvin Critchley, MD Resident/CRNA: Lia Foyer, CRNA Preanesthetic Checklist Completed: patient identified, IV checked, site marked, risks and benefits discussed, surgical consent, monitors and equipment checked, pre-op evaluation and timeout performed Spinal Block Patient position: sitting Prep: DuraPrep Patient monitoring: heart rate, cardiac monitor, continuous pulse ox and blood pressure Approach: midline Location: L3-4 Injection technique: single-shot Needle Needle type: Sprotte  Needle gauge: 24 G Needle length: 9 cm Assessment Sensory level: T4 Additional Notes Attempt x 2 CRNA, attempt x 1 MD

## 2020-01-02 NOTE — Evaluation (Signed)
Physical Therapy Evaluation Patient Details Name: Dawn Vega MRN: [id number] DOB: 18-Apr-1941 Today's Date: 01/02/2020   History of Present Illness  Per MD notes: Pt is a 79 y.o. year old female with L hip OA and is s/p L THA on 01/01/20. PMH includes: HTN, PVD, GERD, HLD, and TIA.    Clinical Impression  Pt pleasant and motivated to participate during the session. Pt found on RA and HR and SpO2 were WNL t/o session. Pt completed all transfers and amb this morning without physical assistance and only minimal cueing for RW use and occasional reminders for precautions. Pt demonstrated good understanding of post hip precautions during transfers and ambulation. Pt will benefit from HHPT services upon discharge to safely address deficits listed in patient problem list for decreased caregiver assistance and eventual return to PLOF.       Follow Up Recommendations Home health PT    Equipment Recommendations  Rolling walker with 5" wheels    Recommendations for Other Services       Precautions / Restrictions Precautions Precautions: Posterior Hip;Fall Precaution Booklet Issued: Yes (comment) Restrictions Weight Bearing Restrictions: Yes LLE Weight Bearing: Weight bearing as tolerated      Mobility  Bed Mobility         General bed mobility comments: Found in recliner  Transfers Overall transfer level: Needs assistance Equipment used: Rolling walker (2 wheeled) Transfers: Sit to/from Stand Sit to Stand: Min guard       General transfer comment: Education provided on body mechanics to maintain posterior hip precautions. No physical assistance needed  Ambulation/Gait Ambulation/Gait assistance: Min guard Gait Distance (Feet): 35 Feet Assistive device: Rolling walker (2 wheeled) Gait Pattern/deviations: Step-to pattern;Decreased step length - right;Decreased step length - left;Decreased weight shift to left Gait velocity: decreased   General Gait Details: Pt ambulated  with a step-to pattern with a pause before advancing the RW. Pt had good demonstration of sequencing for ipsalateral turns with amb  Stairs            Wheelchair Mobility    Modified Rankin (Stroke Patients Only)       Balance Overall balance assessment: Needs assistance Sitting-balance support: No upper extremity supported;Feet supported Sitting balance-Leahy Scale: Good     Standing balance support: Bilateral upper extremity supported;During functional activity Standing balance-Leahy Scale: Good Standing balance comment: Able to stand without UE support and ambulated with light-mod BUE from the RW                             Pertinent Vitals/Pain Pain Assessment: 0-10 Pain Score: 2  Pain Location: L hip Pain Descriptors / Indicators: Aching;Sore Pain Intervention(s): Monitored during session;Premedicated before session;Ice applied    Home Living Family/patient expects to be discharged to:: Private residence Living Arrangements: Spouse/significant other;Children Available Help at Discharge: Family;Personal care attendant;Friend(s);Available PRN/intermittently(Early on, pt has 24/7 care. After the first few days, pt has a variety of family members, friends, and a personal care attendant who can help her prn) Type of Home: House Home Access: Stairs to enter Entrance Stairs-Rails: None Entrance Stairs-Number of Steps: 1 Home Layout: One level Home Equipment: Cane - single point;Shower seat;Walker - 2 wheels;Grab bars - tub/shower;Bedside commode(3WW, RW is a bariatric walker that is too wide to fit through doorways)      Prior Function Level of Independence: Independent with assistive device(s);Needs assistance   Gait / Transfers Assistance Needed: Ind with AD for short community distance  ADL's / Homemaking Assistance Needed: Ind with ADL's but utilizes a personal care aide for IADL's prn (no set schedule)  Comments: Patient reports using 3WW for  functional mobility; patient MOD I for all (B/I)ADLs. Pt denies fall history     Hand Dominance       Extremity/Trunk Assessment       Lower Extremity Assessment Lower Extremity Assessment: Generalized weakness;LLE deficits/detail LLE: Unable to fully assess due to pain     Communication   Communication: No difficulties  Cognition Arousal/Alertness: Awake/alert Behavior During Therapy: WFL for tasks assessed/performed Overall Cognitive Status: Within Functional Limits for tasks assessed                                 General Comments: Able to recall and maintain precautions exceptionally well.      General Comments     Exercises Total Joint Exercises Ankle Circles/Pumps: AROM;Strengthening;Both;10 reps Quad Sets: Strengthening;Both;10 reps Gluteal Sets: Strengthening;Both;10 reps Long Arc Quad: AROM;Strengthening;Both;10 reps Other Exercises Other Exercises: Education on HEP Other Exercises: Verbal and visual explanation/demonstartion of posterior hip precautions w/ quizzing and teachback Other Exercises: Transfer training with post hip precautions Other Exercises: L turn training with post hip precautions   Assessment/Plan    PT Assessment Patient needs continued PT services  PT Problem List Decreased strength;Decreased mobility;Decreased range of motion;Decreased knowledge of precautions;Decreased activity tolerance;Decreased balance;Decreased knowledge of use of DME;Pain       PT Treatment Interventions DME instruction;Therapeutic exercise;Gait training;Balance training;Stair training;Functional mobility training;Therapeutic activities    PT Goals (Current goals can be found in the Care Plan section)  Acute Rehab PT Goals Patient Stated Goal: "walk without pain" PT Goal Formulation: With patient Time For Goal Achievement: 01/15/20 Potential to Achieve Goals: Good    Frequency BID   Barriers to discharge        Co-evaluation                AM-PAC PT "6 Clicks" Mobility  Outcome Measure Help needed turning from your back to your side while in a flat bed without using bedrails?: A Little Help needed moving from lying on your back to sitting on the side of a flat bed without using bedrails?: A Little Help needed moving to and from a bed to a chair (including a wheelchair)?: A Little Help needed standing up from a chair using your arms (e.g., wheelchair or bedside chair)?: A Little Help needed to walk in hospital room?: A Little Help needed climbing 3-5 steps with a railing? : A Lot 6 Click Score: 17    End of Session Equipment Utilized During Treatment: Gait belt Activity Tolerance: Patient tolerated treatment well Patient left: in chair;with call bell/phone within reach;with chair alarm set;with SCD's reapplied;with nursing/sitter in room Nurse Communication: Mobility status;Weight bearing status;Precautions PT Visit Diagnosis: Other abnormalities of gait and mobility (R26.89);Muscle weakness (generalized) (M62.81);Unsteadiness on feet (R26.81);Pain Pain - Right/Left: Left Pain - part of body: Hip    Time: [id number] PT Time Calculation (min) (ACUTE ONLY): 40 min   Charges:             Annabelle Harman, SPT 01/02/20 11:51 AM

## 2020-01-02 NOTE — Progress Notes (Signed)
Obtained Living will and POA. Copies placed in chart.

## 2020-01-02 NOTE — TOC Initial Note (Signed)
Transition of Care [facility]) - Initial/Assessment Note    Patient Details  Name: Dawn Vega MRN: [id number] Date of Birth: 1941-08-23  [facility] CM/SW Contact:    Elease Hashimoto, LCSW Phone Number: 01/02/2020, 8:55 AM  Clinical Narrative:  Met with pt who lives with her husband who is in fair health. She will need to be mobile at Dc with a rw before going home. She was independent prior to admission and hopes once healed she will be doing better and be more mobile. She has numerous pieces of equipment at home from others. She was driving prior to admission and husband alos drives. Will work on discharge needs. She is aware [facility] will be providing home health therapies.                 Expected Discharge Plan: [place] Barriers to Discharge: Continued Medical Work up   Patient Goals and CMS Choice Patient states their goals for this hospitalization and ongoing recovery are:: I hope to do well in therapy today. My husband is in fair health but I need to be mobile at discharge      Expected Discharge Plan and Services Expected Discharge Plan: [place] In-house Referral: Clinical Social Work   Post Acute Care Choice: [place] arrangements for the past 2 months: [place]: PT [place]: [facility] ([facility] [employer]) Date [place]: 01/02/20 Time [place]: [phone] Representative spoke with at [place]: teresa  Prior Living Arrangements/Services Living arrangements for the past 2 months: [place] with:: Spouse Patient language and need for interpreter reviewed:: No Do you feel safe going back to the place where you live?: Yes      Need for Family Participation in Patient Care: No (Comment) Care giver support system in place?: Yes (comment) Current home services: DME(has bsc, three wheeled walker, tub seat and  rw-husband's) Criminal Activity/Legal Involvement Pertinent to Current Situation/Hospitalization: No - Comment as needed  Activities of Daily Living Home Assistive Devices/Equipment: Eyeglasses, Cane (specify quad or straight), Walker (specify type), Grab bars around toilet, Grab bars in shower, Hearing aid, Blood pressure cuff ADL Screening (condition at time of admission) Patient's cognitive ability adequate to safely complete daily activities?: Yes Is the patient deaf or have difficulty hearing?: No Does the patient have difficulty seeing, even when wearing glasses/contacts?: No Does the patient have difficulty concentrating, remembering, or making decisions?: No Patient able to express need for assistance with ADLs?: Yes Does the patient have difficulty dressing or bathing?: No Independently performs ADLs?: Yes (appropriate for developmental age) Does the patient have difficulty walking or climbing stairs?: Yes Weakness of Legs: Left Weakness of Arms/Hands: None  Permission Sought/Granted   Permission granted to share information with : Yes, Verbal Permission Granted  Share Information with NAME: Helene Kelp  Permission granted to share info w AGENCY: [facility]  Permission granted to share info w Relationship: husband     Emotional Assessment Appearance:: Appears stated age Attitude/Demeanor/Rapport: Engaged Affect (typically observed): Adaptable, Accepting Orientation: : Oriented to Self, Oriented to Place, Oriented to  Time, Oriented to Situation Alcohol / Substance Use: Never Used Psych Involvement: No (comment)  Admission diagnosis:  H/O total hip arthroplasty [Z96.649] Patient Active Problem List   Diagnosis  Date Noted  . Hyperlipidemia 01/01/2020  . H/O total hip arthroplasty 01/01/2020  . Primary osteoarthritis of left hip 08/24/2019  . Palpitations 06/25/2019  . TIA (transient ischemic attack) 03/13/2019  . Claudication ([place]) 07/25/2018  . Lumbar degenerative disc  disease 07/25/2018  . Hyperglycemia 07/19/2017  . Essential hypertension 12/15/2015  . Gastroesophageal reflux disease without esophagitis 12/15/2015  . Primary osteoarthritis involving multiple joints 12/15/2015  . Hearing loss 04/30/2014  . Tremor 04/30/2014  . Chronic cholecystitis 05/22/2013   PCP:  Adin Hector, MD Pharmacy:   [facility] [phone] [address], [place] [address] Phone: [phone] Fax: [phone]     Social Determinants of Health (SDOH) Interventions    Readmission Risk Interventions No flowsheet data found.

## 2020-01-02 NOTE — Evaluation (Signed)
Occupational Therapy Evaluation Patient Details Name: Dawn Vega MRN: [id number] DOB: Oct 02, 1941 Today's Date: 01/02/2020    History of Present Illness Dawn Vega is a 79 y.o. year old female with a long history of progressive hip and groin  pain. X-rays demonstrated severe degenerative changes. L THA 01/01/20 Dr. Marry Guan.   Clinical Impression   Patient presents to therapy after L THA with posterior approach.  Patient approached in AM, sitting up in bed and agreeable to therapy.  Patient has no complaints of pain and is cooperative.  Provided education on goals and role of OT in acute care.  Verbalized understanding.  Patient performed bed mobility with SPV/SBA.  SPT using RW with CGA. Noted some dizziness during functional transfer.  Vitals at 123/60, 95% 02 on RA, and 76HR.  Provided extensive education and handout on hip precautions, compensatory techniques, healing process, adaptive equipment and ADL retraining.  Patient demonstrates excellent carryover of education and compensatory techniques in functional tasks.  Patient would benefit from continued skilled occupational therapy sessions to address outlined performance components below.  Based on today's performance, recommending HH OT and intermittent SPV.      Follow Up Recommendations  Home health OT    Equipment Recommendations  None recommended by OT    Recommendations for Other Services       Precautions / Restrictions Precautions Precautions: Posterior Hip;Fall Restrictions Weight Bearing Restrictions: Yes LLE Weight Bearing: Weight bearing as tolerated      Mobility Bed Mobility Overal bed mobility: Needs Assistance Bed Mobility: Supine to Sit     Supine to sit: Supervision;HOB elevated     General bed mobility comments: Extra time for L LE  Transfers Overall transfer level: Needs assistance Equipment used: Rolling walker (2 wheeled) Transfers: Sit to/from [facility] Sit to  Stand: Min guard;From elevated surface Stand pivot transfers: Min guard       General transfer comment: Education provided on body mechanics to maintain posterior hip precautions    Balance Overall balance assessment: Needs assistance Sitting-balance support: No upper extremity supported;Feet supported Sitting balance-Leahy Scale: Good                                     ADL either performed or assessed with clinical judgement   ADL Overall ADL's : Needs assistance/impaired                     Lower Body Dressing: Minimal assistance;Sit to/from stand;Cueing for compensatory techniques;With adaptive equipment;Cueing for sequencing   Toilet Transfer: Min guard;RW   Toileting- Water quality scientist and Hygiene: Min guard;Sit to/from stand       Functional mobility during ADLs: Min guard;Rolling walker;Cueing for safety General ADL Comments: patient demonstrates exceptionally good carryover with precautions and use of compensatory techniques during functional activities and mobility     Vision Baseline Vision/History: Wears glasses Wears Glasses: At all times Patient Visual Report: No change from baseline       Perception     Praxis      Pertinent Vitals/Pain Pain Assessment: No/denies pain     Hand Dominance Right   Extremity/Trunk Assessment Upper Extremity Assessment Upper Extremity Assessment: Overall WFL for tasks assessed   Lower Extremity Assessment Lower Extremity Assessment: Defer to PT evaluation   Cervical / Trunk Assessment Cervical / Trunk Assessment: Normal   Communication Communication Communication: No difficulties   Cognition Arousal/Alertness: Awake/alert Behavior  During Therapy: WFL for tasks assessed/performed Overall Cognitive Status: Within Functional Limits for tasks assessed                                 General Comments: A&Ox4, pleasant and cooperative.  Able to recall and maintain precautions  exceptionally well.   General Comments  Patient states she has friends who are nurses and therapist and is familiar with adaptive equipment.    Exercises Other Exercises Other Exercises: Provided education on goals and role of OT in acute care setting Other Exercises: Education on posterior hip precautions Other Exercises: Education on use of AE, compensatory techniques and body mechanics during ADLs and functional transfer to maintain hip precautions Other Exercises: Completed bed mobility with SPV and extra time.  SPT using RW with CGA.   Shoulder Instructions      Home Living Family/patient expects to be discharged to:: Private residence Living Arrangements: Spouse/significant other;Children Available Help at Discharge: Family Type of Home: House Home Access: Stairs to enter Technical brewer of Steps: 1 Entrance Stairs-Rails: None Home Layout: One level     Bathroom Shower/Tub: Occupational psychologist: [place] - single point;Shower seat;Walker - 2 wheels;Grab bars - tub/shower;Bedside commode;Adaptive equipment(utilizes 3WW for mobility) Adaptive Equipment: Reacher;Sock aid;Long-handled shoe horn        Prior Functioning/Environment Level of Independence: Independent with assistive device(s)        Comments: Patient reports using 3WW for functional mobility; patient MOD I for all (B/I)ADLs.        OT Problem List: Decreased activity tolerance;Decreased knowledge of precautions;Decreased knowledge of use of DME or AE      OT Treatment/Interventions: Self-care/ADL training;Energy conservation;DME and/or AE instruction;Therapeutic activities;Patient/family education    OT Goals(Current goals can be found in the care plan section) Acute Rehab OT Goals Patient Stated Goal: "I want to make sure I feel safe" OT Goal Formulation: With patient Time For Goal Achievement: 01/16/20 Potential to Achieve Goals: Good  OT Frequency:  Min 2X/week   Barriers to D/C:            Co-evaluation              AM-PAC OT "6 Clicks" Daily Activity     Outcome Measure Help from another person eating meals?: None Help from another person taking care of personal grooming?: None Help from another person toileting, which includes using toliet, bedpan, or urinal?: A Little Help from another person bathing (including washing, rinsing, drying)?: A Little Help from another person to put on and taking off regular upper body clothing?: None Help from another person to put on and taking off regular lower body clothing?: A Little 6 Click Score: 21   End of Session Equipment Utilized During Treatment: Gait belt;Rolling walker Nurse Communication: Other (comment)(Discussed medication patient is taking)  Activity Tolerance: Patient tolerated treatment well;No increased pain Patient left: in chair;with call bell/phone within reach;with chair alarm set  OT Visit Diagnosis: Unsteadiness on feet (R26.81)                Time: [id number] OT Time Calculation (min): 52 min Charges:  OT General Charges $OT Visit: 1 Visit OT Evaluation $OT Eval Low Complexity: 1 Low OT Treatments $Self Care/Home Management : 8-22 mins $Therapeutic Activity: 23-37 mins  Baldomero Lamy, MS, OTR/L 01/02/20, 11:23 AM

## 2020-01-02 NOTE — Progress Notes (Signed)
  Subjective: 1 Day Post-Op Procedure(s) (LRB): TOTAL HIP ARTHROPLASTY (Left) Patient reports pain as well-controlled.   Patient is well, and has had no acute complaints or problems Plan is to go Home after hospital stay. Negative for chest pain and shortness of breath Fever: no Gastrointestinal: negative for nausea and vomiting.  Patient has not had a bowel movement. But has passed gas.  Objective: Vital signs in last 24 hours: Temp:  [96.8 F (36 C)-97.9 F (36.6 C)] 97.7 F (36.5 C) (03/18 0804) Pulse Rate:  [73-91] 77 (03/18 0804) Resp:  [7-18] 16 (03/18 0804) BP: (108-160)/(50-94) 108/50 (03/18 0804) SpO2:  [94 %-100 %] 96 % (03/18 0804)  Intake/Output from previous day:  Intake/Output Summary (Last 24 hours) at 01/02/2020 0805 Last data filed at 01/02/2020 0600 Gross per 24 hour  Intake 2907.52 ml  Output 1400 ml  Net 1507.52 ml    Intake/Output this shift: No intake/output data recorded.  Labs: No results for input(s): HGB in the last 72 hours. No results for input(s): WBC, RBC, HCT, PLT in the last 72 hours. No results for input(s): NA, K, CL, CO2, BUN, CREATININE, GLUCOSE, CALCIUM in the last 72 hours. Recent Labs    12/30/19 0922  INR 1.0     EXAM General - Patient is Alert, Appropriate and Oriented Extremity - Neurovascular intact Compartment soft Dressing/Incision -clean, dry, no drainage, ice pack in place  Motor Function - intact, moving foot and toes well on exam.  Cardiovascular- Regular rate and rhythm, no murmurs/rubs/gallops Respiratory- Lungs clear to auscultation bilaterally Gastrointestinal- soft, nontender and active bowel sounds   Assessment/Plan: 1 Day Post-Op Procedure(s) (LRB): TOTAL HIP ARTHROPLASTY (Left) Active Problems:   H/O total hip arthroplasty  Estimated body mass index is 33.65 kg/m as calculated from the following:   Height as of 12/27/19: 5\' 2"  (1.575 m).   Weight as of 12/27/19: 83.5 kg. Advance diet Up with  therapy Plan for discharge tomorrow    DVT Prophylaxis - Lovenox, Ted hose and foot pumps Weight-Bearing as tolerated to left leg  Cassell Smiles, PA-C [facility] Orthopaedic Surgery 01/02/2020, 8:05 AM

## 2020-01-02 NOTE — Progress Notes (Signed)
Physical Therapy Treatment Patient Details Name: Dawn Vega MRN: [id number] DOB: Dec 16, 1940 Today's Date: 01/02/2020    History of Present Illness Per MD notes: Pt is a 79 y.o. year old female with L hip OA and is s/p L THA on 01/01/20. PMH includes: HTN, PVD, GERD, HLD, and TIA.    PT Comments    Pre-medicated before session.  Difficulty voiding.  OOB to commode and able to void.  She stood and continued around unit with RW and min guard/supervision.  Generally safe and steady.  IV did slide out during session.  Pressure applied and RN aware.   Follow Up Recommendations  Home health PT     Equipment Recommendations  Rolling walker with 5" wheels    Recommendations for Other Services       Precautions / Restrictions Precautions Precautions: Posterior Hip;Fall Precaution Booklet Issued: Yes (comment) Restrictions Weight Bearing Restrictions: Yes LLE Weight Bearing: Weight bearing as tolerated    Mobility  Bed Mobility Overal bed mobility: Needs Assistance Bed Mobility: Supine to Sit     Supine to sit: Supervision;HOB elevated     General bed mobility comments: Found in recliner  Transfers Overall transfer level: Needs assistance Equipment used: Rolling walker (2 wheeled) Transfers: Sit to/from Stand Sit to Stand: Min guard Stand pivot transfers: Min guard       General transfer comment: Education provided on body mechanics to maintain posterior hip precautions. No physical assistance needed  Ambulation/Gait Ambulation/Gait assistance: Min guard Gait Distance (Feet): 160 Feet Assistive device: Rolling walker (2 wheeled) Gait Pattern/deviations: Step-to pattern;Decreased step length - right;Decreased step length - left;Decreased weight shift to left Gait velocity: decreased   General Gait Details: Pt ambulated with a step-to pattern with a pause before advancing the RW. Pt had good demonstration of sequencing for ipsalateral turns with amb   Stairs              Wheelchair Mobility    Modified Rankin (Stroke Patients Only)       Balance Overall balance assessment: Needs assistance Sitting-balance support: No upper extremity supported;Feet supported Sitting balance-Leahy Scale: Good     Standing balance support: Bilateral upper extremity supported;During functional activity Standing balance-Leahy Scale: Good Standing balance comment: Able to stand without UE support and ambulated with light-mod BUE from the RW                            Cognition Arousal/Alertness: Awake/alert Behavior During Therapy: [facility] for tasks assessed/performed Overall Cognitive Status: Within Functional Limits for tasks assessed                                 General Comments: Able to recall and maintain precautions exceptionally well.      Exercises Total Joint Exercises Ankle Circles/Pumps: AROM;Strengthening;Both;10 reps Quad Sets: Strengthening;Both;10 reps Gluteal Sets: Strengthening;Both;10 reps Long Arc Quad: AROM;Strengthening;Both;10 reps Other Exercises Other Exercises: to commode to void Other Exercises: Verbal and visual explanation/demonstartion of posterior hip precautions w/ quizzing and teachback Other Exercises: Transfer training with post hip precautions Other Exercises: L turn training with post hip precautions    General Comments General comments (skin integrity, edema, etc.): Patient states she has friends who are nurses and therapist and is familiar with adaptive equipment.      Pertinent Vitals/Pain Pain Assessment: Faces Pain Score: 2  Faces Pain Scale: Hurts a little bit Pain  Location: L hip Pain Descriptors / Indicators: Sore Pain Intervention(s): Premedicated before session;Limited activity within patient's tolerance;Monitored during session    Home Living Family/patient expects to be discharged to:: Private residence Living Arrangements: Spouse/significant  other;Children Available Help at Discharge: Family;Personal care attendant;Friend(s);Available PRN/intermittently(Early on, pt has 24/7 care. After the first few days, pt has a variety of family members, friends, and a personal care attendant who can help her prn) Type of Home: House Home Access: Stairs to enter Entrance Stairs-Rails: None Home Layout: One level Home Equipment: Cane - single point;Shower seat;Walker - 2 wheels;Grab bars - tub/shower;Bedside commode(3WW, RW is a bariatric walker that is too wide to fit through doorways)      Prior Function Level of Independence: Independent with assistive device(s);Needs assistance  Gait / Transfers Assistance Needed: Ind with AD for short community distance ADL's / Homemaking Assistance Needed: Ind with ADL's but utilizes a personal care aide for IADL's prn (not set schedule) Comments: Patient reports using 3WW for functional mobility; patient MOD I for all (B/I)ADLs. Pt denies fall history   PT Goals (current goals can now be found in the care plan section) Acute Rehab PT Goals Patient Stated Goal: "walk without pain" PT Goal Formulation: With patient Time For Goal Achievement: 01/15/20 Potential to Achieve Goals: Good Progress towards PT goals: Progressing toward goals    Frequency    BID      PT Plan Current plan remains appropriate    Co-evaluation              AM-PAC PT "6 Clicks" Mobility   Outcome Measure  Help needed turning from your back to your side while in a flat bed without using bedrails?: None Help needed moving from lying on your back to sitting on the side of a flat bed without using bedrails?: A Little Help needed moving to and from a bed to a chair (including a wheelchair)?: A Little Help needed standing up from a chair using your arms (e.g., wheelchair or bedside chair)?: A Little Help needed to walk in hospital room?: A Little Help needed climbing 3-5 steps with a railing? : A Little 6 Click  Score: 19    End of Session Equipment Utilized During Treatment: Gait belt Activity Tolerance: Patient tolerated treatment well Patient left: in chair;with call bell/phone within reach;with chair alarm set;with nursing/sitter in room Nurse Communication: Mobility status;Weight bearing status;Precautions PT Visit Diagnosis: Other abnormalities of gait and mobility (R26.89);Muscle weakness (generalized) (M62.81);Unsteadiness on feet (R26.81);Pain Pain - Right/Left: Left Pain - part of body: Hip     Time: 1450-1505 PT Time Calculation (min) (ACUTE ONLY): 15 min  Charges:  $Gait Training: 8-22 mins $Therapeutic Activity: 8-22 mins                     Chesley Noon, PTA 01/02/20, 3:13 PM

## 2020-01-02 NOTE — Progress Notes (Signed)
Pt unable to void. C/o feeling full and needing to void. MD Rudene Christians notified, ordered to replace urethral catheter until am. Foley was replaced and will remove at 0600.

## 2020-01-03 LAB — SURGICAL PATHOLOGY

## 2020-01-03 MED ORDER — OXYCODONE HCL 5 MG PO TABS: 5.0000 mg | ORAL_TABLET | ORAL | 0 refills | Status: DC | PRN

## 2020-01-03 MED ORDER — ENOXAPARIN SODIUM 40 MG/0.4ML ~~LOC~~ SOLN: 40.0000 mg | SUBCUTANEOUS | 0 refills | Status: DC

## 2020-01-03 MED ORDER — CELECOXIB 200 MG PO CAPS: 200.0000 mg | ORAL_CAPSULE | Freq: Two times a day (BID) | ORAL | 0 refills | Status: DC

## 2020-01-03 MED ORDER — TRAMADOL HCL 50 MG PO TABS: 50.0000 mg | ORAL_TABLET | ORAL | 0 refills | Status: DC | PRN

## 2020-01-03 NOTE — Discharge Summary (Signed)
Physician Discharge Summary  Patient ID: Dawn Vega MRN: [id number] DOB/AGE: [birth year] 79 y.o.  Admit date: 01/01/2020 Discharge date: 01/03/2020  Admission Diagnoses:  H/O total hip arthroplasty [Z96.649]  Surgeries:Procedure(s): Left total hip arthroplasty  SURGEON:  Marciano Sequin. M.D.  ASSISTANT: Cassell Smiles, PA-C (present and scrubbed throughout the case, critical for assistance with exposure, retraction, instrumentation, and closure)  ANESTHESIA: spinal  ESTIMATED BLOOD LOSS: 200 mL  FLUIDS REPLACED: 1500 mL of crystalloid  DRAINS: 2 medium drains to a Hemovac reservoir  IMPLANTS UTILIZED: DePuy 12 mm small stature AML femoral stem, 52 mm OD Pinnacle 100 acetabular component, +4 mm 10 degree Pinnacle Marathon polyethylene insert, and a 36 mm M-SPEC +1.5 mm hip ball  Discharge Diagnoses: Patient Active Problem List   Diagnosis Date Noted  . Hyperlipidemia 01/01/2020  . H/O total hip arthroplasty 01/01/2020  . Primary osteoarthritis of left hip 08/24/2019  . Palpitations 06/25/2019  . TIA (transient ischemic attack) 03/13/2019  . Claudication ([place]) 07/25/2018  . Lumbar degenerative disc disease 07/25/2018  . Hyperglycemia 07/19/2017  . Essential hypertension 12/15/2015  . Gastroesophageal reflux disease without esophagitis 12/15/2015  . Primary osteoarthritis involving multiple joints 12/15/2015  . Hearing loss 04/30/2014  . Tremor 04/30/2014  . Chronic cholecystitis 05/22/2013    Past Medical History:  Diagnosis Date  . Arthritis   . GERD (gastroesophageal reflux disease)   . History of kidney stones   . Hyperlipidemia   . Hypertension   . Stroke [facility]) 01/2019   TIA     Transfusion:    Consultants (if any):   Discharged Condition: Improved  Hospital Course: Dawn Vega is an 79 y.o. female who was admitted 01/01/2020 with a diagnosis of left hip osteoarthritis and went to the operating room on 01/01/2020 and underwent left total  hip arthroplasty through posterior approach. The patient received perioperative antibiotics for prophylaxis (see below). The patient tolerated the procedure well and was transported to PACU in stable condition. After meeting PACU criteria, the patient was subsequently transferred to the Orthopaedics/Rehabilitation unit.   The patient received DVT prophylaxis in the form of early mobilization, Lovenox, Foot Pumps and TED hose. A sacral pad had been placed and heels were elevated off of the bed with rolled towels in order to protect skin integrity. Foley catheter was discontinued on postoperative day #0. Wound drains were discontinued on postoperative day #2. The surgical incision was healing well without signs of infection.  Physical therapy was initiated postoperatively for transfers, gait training, and strengthening. Occupational therapy was initiated for activities of daily living and evaluation for assisted devices. Rehabilitation goals were reviewed in detail with the patient. The patient made steady progress with physical therapy and physical therapy recommended discharge to Home.   The patient achieved her preliminary goals of this hospitalization and was felt to be medically and orthopaedically appropriate for discharge.  She was given perioperative antibiotics:  Anti-infectives (From admission, onward)   Start     Dose/Rate Route Frequency Ordered Stop   01/01/20 1900  ceFAZolin (ANCEF) IVPB 2g/100 mL premix     2 g 200 mL/hr over 30 Minutes Intravenous Every 6 hours 01/01/20 1858 01/02/20 1727   01/01/20 1145  ceFAZolin (ANCEF) IVPB 2g/100 mL premix     2 g 200 mL/hr over 30 Minutes Intravenous On call to O.R. 01/01/20 1134 01/01/20 1317   01/01/20 1136  ceFAZolin (ANCEF) 2-4 GM/100ML-% IVPB    Note to Pharmacy: Trudie Reed   : cabinet override  01/01/20 1136 01/02/20 1203    .  Recent vital signs:  Vitals:   01/02/20 2337 01/03/20 0756  BP: (!) 127/55 (!) 134/57  Pulse: 86 89    Resp: 18 18  Temp: 98.1 F (36.7 C) (!) 97.4 F (36.3 C)  SpO2: 98% 99%    Recent laboratory studies:  No results for input(s): WBC, HGB, HCT, PLT, K, CL, CO2, BUN, CREATININE, GLUCOSE, CALCIUM, LABPT, INR in the last 72 hours.  Diagnostic Studies: DG Hip Port Unilat With Pelvis 1V Left  Result Date: 01/01/2020 CLINICAL DATA:  Total left hip arthroplasty. EXAM: DG HIP (WITH OR WITHOUT PELVIS) 1V PORT LEFT COMPARISON:  None. FINDINGS: Left hip arthroplasty in expected alignment. No periprosthetic lucency or fracture. Recent postsurgical change includes surgical drain, air and edema in the joint space and soft tissues. Lateral skin staples. Pubic rami and right hip are intact. IMPRESSION: Left hip arthroplasty in expected alignment without immediate postoperative complication. Electronically Signed   By: Keith Rake M.D.   On: 01/01/2020 17:43    Discharge Medications:   Allergies as of 01/03/2020      Reactions   Potassium-containing Compounds    Pt does not remember reaction or intolerance    Ace Inhibitors Cough   Amlodipine Other (See Comments)   Constipation, edema   Calcium Other (See Comments)   Intolerance, constipation   Crestor [rosuvastatin Calcium] Other (See Comments)   Muscle pain   Hydrochlorothiazide Other (See Comments)   Bilateral hand tingling   Lipitor [atorvastatin Calcium] Other (See Comments)   Muscle pain.   Pravastatin Other (See Comments)   Muscle pain   Tape Rash   Silicone tape      Medication List    STOP taking these medications   aspirin EC 325 MG tablet   ibuprofen 200 MG tablet Commonly known as: ADVIL     TAKE these medications   acetaminophen 500 MG tablet Commonly known as: TYLENOL Take 500 mg by mouth every 6 (six) hours as needed (for pain.).   B-complex with vitamin C tablet Take 1 tablet by mouth at bedtime.   celecoxib 200 MG capsule Commonly known as: CELEBREX Take 1 capsule (200 mg total) by mouth 2 (two) times  daily.   Cholecalciferol 25 MCG (1000 UT) tablet Take 1,000 Units by mouth 2 (two) times daily.   enoxaparin 40 MG/0.4ML injection Commonly known as: LOVENOX Inject 0.4 mLs (40 mg total) into the skin daily for 14 days.   FISH OIL PO Take 2,000 mg by mouth in the morning and at bedtime. Takes either1000mg  BID or 2000mg  BID depending on what she has on hand.   fish oil-omega-3 fatty acids 1000 MG capsule Take 1,000 mg by mouth in the morning and at bedtime.   losartan 100 MG tablet Commonly known as: COZAAR Take 100 mg by mouth daily.   Lubricant Eye Drops 0.4-0.3 % Soln Generic drug: Polyethyl Glycol-Propyl Glycol Place 1 drop into both eyes 3 (three) times daily as needed (dry/irritated eyes.).   Melatonin 2.5 MG Caps Take 5 mg by mouth at bedtime as needed. One 5 mg tab   metoprolol succinate 100 MG 24 hr tablet Commonly known as: TOPROL-XL Take 100 mg by mouth daily.   multivitamin with minerals tablet Take 1 tablet by mouth daily.   omeprazole 20 MG capsule Commonly known as: PRILOSEC Take 20 mg by mouth daily.   oxyCODONE 5 MG immediate release tablet Commonly known as: Oxy IR/ROXICODONE Take 1 tablet (  5 mg total) by mouth every 4 (four) hours as needed for moderate pain (pain score 4-6).   sodium chloride 0.65 % Soln nasal spray Commonly known as: OCEAN Place 1-2 sprays into both nostrils 4 (four) times daily as needed for congestion.   traMADol 50 MG tablet Commonly known as: ULTRAM Take 1 tablet (50 mg total) by mouth every 4 (four) hours as needed for moderate pain.   vitamin C 1000 MG tablet Take 1,000 mg by mouth every evening.   vitamin E 180 MG (400 UNITS) capsule Take 400 Units by mouth at bedtime.            Durable Medical Equipment  (From admission, onward)         Start     Ordered   01/01/20 1858  DME Walker rolling  Once    Question:  Patient needs a walker to treat with the following condition  Answer:  S/P total hip arthroplasty    01/01/20 1858   01/01/20 1858  DME Bedside commode  Once    Question:  Patient needs a bedside commode to treat with the following condition  Answer:  S/P total hip arthroplasty   01/01/20 1858          Disposition: home with home health PT    Follow-up Information    Hooten, Laurice Record, MD Follow up on 02/13/2020.   Specialty: Orthopedic Surgery Why: 2:30 PM Contact information: [address] [place] [id number] [place], PA-C 01/03/2020, 9:49 AM

## 2020-01-03 NOTE — Progress Notes (Signed)
Physical Therapy Treatment Patient Details Name: Dawn Vega MRN: [id number] DOB: 08/27/1941 Today's Date: 01/03/2020    History of Present Illness Per MD notes: Pt is a 79 y.o. year old female with L hip OA and is s/p L THA on 01/01/20. PMH includes: HTN, PVD, GERD, HLD, and TIA.    PT Comments    Pt pleasant and motivated to participate during the session. This afternoon's session primarily focused on pt and family education in regards to post hip precautions as it relates to transfers, ambulation, stairs, and car transfers. Pt and family expressed understanding and pt was able to demonstrate all tasks while maintaining precautions. Pt will benefit from HHPT services upon discharge to safely address deficits listed in patient problem list for decreased caregiver assistance and eventual return to PLOF.      Follow Up Recommendations  Home health PT     Equipment Recommendations  Rolling walker with 5" wheels    Recommendations for Other Services       Precautions / Restrictions Precautions Precautions: Posterior Hip;Fall Precaution Booklet Issued: Yes (comment) Restrictions Weight Bearing Restrictions: Yes LLE Weight Bearing: Weight bearing as tolerated    Mobility  Bed Mobility         General bed mobility comments: Found in recliner  Transfers Overall transfer level: Needs assistance Equipment used: Rolling walker (2 wheeled) Transfers: Sit to/from Stand Sit to Stand: Supervision         General transfer comment: Good carryover with hip precautions during transfer training  Ambulation/Gait Ambulation/Gait assistance: Supervision Gait Distance (Feet): 200 Feet Assistive device: Rolling walker (2 wheeled) Gait Pattern/deviations: Step-through pattern;Antalgic Gait velocity: decreased   General Gait Details: Pt had improved step length and decreased BUE assist through the RW. Occasional minimal cueing required for sequencing for L hand  turns   Stairs Completed in AM, verbally reviewed in PM with pt and family  Wheelchair Mobility    Modified Rankin (Stroke Patients Only)       Balance Overall balance assessment: Modified Independent Sitting-balance support: No upper extremity supported;Feet unsupported Sitting balance-Leahy Scale: Good     Standing balance support: Bilateral upper extremity supported;During functional activity Standing balance-Leahy Scale: Good Standing balance comment: Able to stand without UE support and ambulated with light BUE from the RW                            Cognition Arousal/Alertness: Awake/alert Behavior During Therapy: [facility] for tasks assessed/performed Overall Cognitive Status: Within Functional Limits for tasks assessed                                 General Comments: Able to recall and maintain precautions well.      Exercises Total Joint Exercises Ankle Circles/Pumps: AROM;Strengthening;Both;10 reps Other Exercises Other Exercises: Verbally and visually demonstrated car transfer sequencing with pt and family Other Exercises: Verbal and visual explanation/demonstartion of posterior hip precautions w/ quizzing and teachback with pt and family Other Exercises: Transfer training with post hip precautions with pt and family Other Exercises: L turn training with post hip precautions with pt and family    General Comments        Pertinent Vitals/Pain Pain Assessment: No/denies pain Pain Intervention(s): Monitored during session;Premedicated before session    Home Living  Prior Function            PT Goals (current goals can now be found in the care plan section) Progress towards PT goals: Progressing toward goals    Frequency    BID      PT Plan Current plan remains appropriate    Co-evaluation              AM-PAC PT "6 Clicks" Mobility   Outcome Measure  Help needed turning from your  back to your side while in a flat bed without using bedrails?: None Help needed moving from lying on your back to sitting on the side of a flat bed without using bedrails?: A Little Help needed moving to and from a bed to a chair (including a wheelchair)?: A Little Help needed standing up from a chair using your arms (e.g., wheelchair or bedside chair)?: A Little Help needed to walk in hospital room?: A Little Help needed climbing 3-5 steps with a railing? : A Little 6 Click Score: 19    End of Session Equipment Utilized During Treatment: Gait belt Activity Tolerance: Patient tolerated treatment well Patient left: in chair;with call bell/phone within reach;with chair alarm set;with SCD's reapplied Nurse Communication: Mobility status;Weight bearing status;Precautions PT Visit Diagnosis: Other abnormalities of gait and mobility (R26.89);Muscle weakness (generalized) (M62.81);Unsteadiness on feet (R26.81);Pain Pain - Right/Left: Left Pain - part of body: Hip     Time: [id number] PT Time Calculation (min) (ACUTE ONLY): 44 min  Charges:  $Gait Training: 8-22 mins $Therapeutic Exercise: 8-22 mins $Therapeutic Activity: 8-22 mins                     Annabelle Harman, SPT 01/03/20 2:45 PM

## 2020-01-03 NOTE — Progress Notes (Signed)
  Subjective: 2 Days Post-Op Procedure(s) (LRB): TOTAL HIP ARTHROPLASTY (Left) Patient reports no pain this AM. Patient is well, and has had no acute complaints or problems Plan is to go Home after hospital stay. Negative for chest pain and shortness of breath Fever: no Gastrointestinal: negative for nausea and vomiting.  Patient has had a bowel movement.  Objective: Vital signs in last 24 hours: Temp:  [97.4 F (36.3 C)-98.1 F (36.7 C)] 97.4 F (36.3 C) (03/19 0756) Pulse Rate:  [79-89] 89 (03/19 0756) Resp:  [18] 18 (03/19 0756) BP: (126-134)/(55-59) 134/57 (03/19 0756) SpO2:  [97 %-99 %] 99 % (03/19 0756)  Intake/Output from previous day:  Intake/Output Summary (Last 24 hours) at 01/03/2020 0916 Last data filed at 01/02/2020 2129 Gross per 24 hour  Intake 1130.36 ml  Output 320 ml  Net 810.36 ml    Intake/Output this shift: No intake/output data recorded.  Labs: No results for input(s): HGB in the last 72 hours. No results for input(s): WBC, RBC, HCT, PLT in the last 72 hours. No results for input(s): NA, K, CL, CO2, BUN, CREATININE, GLUCOSE, CALCIUM in the last 72 hours. No results for input(s): LABPT, INR in the last 72 hours.   EXAM General - Patient is Alert, Appropriate and Oriented Extremity - Neurovascular intact Dorsiflexion/Plantar flexion intact Compartment soft Dressing/Incision -clean, dry, no drainage, Hemovac in place.  Motor Function - intact, moving foot and toes well on exam.  Cardiovascular- Regular rate and rhythm, no murmurs/rubs/gallops Respiratory- Lungs clear to auscultation bilaterally Gastrointestinal- soft and nontender   Assessment/Plan: 2 Days Post-Op Procedure(s) (LRB): TOTAL HIP ARTHROPLASTY (Left) Active Problems:   H/O total hip arthroplasty  Estimated body mass index is 33.65 kg/m as calculated from the following:   Height as of 12/27/19: 5\' 2"  (1.575 m).   Weight as of 12/27/19: 83.5 kg. Advance diet Up with  therapy Discharge home with home health  Hemovac removed.   DVT Prophylaxis - Lovenox, Ted hose and foot pumps Weight-Bearing as tolerated to left leg  Cassell Smiles, PA-C [facility] Orthopaedic Surgery 01/03/2020, 9:16 AM

## 2020-01-03 NOTE — TOC Transition Note (Signed)
Transition of Care [facility]) - CM/SW Discharge Note   Patient Details  Name: Dawn Vega MRN: [id number] Date of Birth: Mar 16, 1941  Transition of Care [facility] CM/SW Contact:  Elease Hashimoto, LCSW Phone Number: 01/03/2020, 10:09 AM   Clinical Narrative:   Pt set up with [facility] for PT follow and has rw via [employer]. Pt going home with husband who can assist if needed. Pt set for Dc today and feels ready to go home. No further follow due to dc    Final next level of care: [place] Barriers to Discharge: Barriers Resolved   Patient Goals and CMS Choice Patient states their goals for this hospitalization and ongoing recovery are:: I hope to do well in therapy today. My husband is in fair health but I need to be mobile at discharge      Discharge Placement                Patient to be transferred to facility by: Husband via car Name of family member notified: husband Patient and family notified of of transfer: 01/03/20  Discharge Plan and Services In-house Referral: Clinical Social Work   Post Acute Care Choice: Home Health          DME Arranged: Gilford Rile rolling DME Agency: [employer] Date DME Agency Contacted: 01/03/20 Time DME Agency Contacted: 51 Representative spoke with at DME Agency: [place]oy [place] HH Arranged: PT [place]: [facility] (formerly [employer]) Date [place]: 01/02/20 Time [place]: [phone] Representative spoke with at [place]: teresa  Social Determinants of Health ([place]) Interventions     Readmission Risk Interventions No flowsheet data found.

## 2020-01-03 NOTE — Progress Notes (Signed)
Physical Therapy Treatment Patient Details Name: Dawn Vega MRN: [id number] DOB: 10/27/1940 Today's Date: 01/03/2020    History of Present Illness Per MD notes: Pt is a 79 y.o. year old female with L hip OA and is s/p L THA on 01/01/20. PMH includes: HTN, PVD, GERD, HLD, and TIA.    PT Comments    Pt pleasant and motivated to participate during the session. Pt had good carryover with post hip precautions and only required occasional minimal cueing t/o the session. Pt completed bed mobility, transfers, amb around the nsg station, and stair training without physical assistance. Car transfers and HEP info was reviewed and pt verbally expressed understanding. Pt will benefit from HHPT services upon discharge to safely address deficits listed in patient problem list for decreased caregiver assistance and eventual return to PLOF.      Follow Up Recommendations  Home health PT     Equipment Recommendations  Rolling walker with 5" wheels    Recommendations for Other Services       Precautions / Restrictions Precautions Precautions: Posterior Hip;Fall Precaution Booklet Issued: Yes (comment) Restrictions Weight Bearing Restrictions: Yes LLE Weight Bearing: Weight bearing as tolerated    Mobility  Bed Mobility Overal bed mobility: Modified Independent Bed Mobility: Supine to Sit     Supine to sit: Modified independent (Device/Increase time)        Transfers Overall transfer level: Needs assistance Equipment used: Rolling walker (2 wheeled) Transfers: Sit to/from Stand Sit to Stand: Supervision         General transfer comment: Education provided on body mechanics to maintain posterior hip precautions. No physical assistance needed  Ambulation/Gait Ambulation/Gait assistance: Min guard Gait Distance (Feet): 200 Feet Assistive device: Rolling walker (2 wheeled) Gait Pattern/deviations: Step-through pattern;Antalgic Gait velocity: decreased   General Gait Details:  Pt had improved step length this morning with occasional minimal cueing required for RW use and sequencing for L hand turns   Stairs Stairs: Yes Stairs assistance: Min guard Stair Management: Forwards Number of Stairs: 2 General stair comments: Minimal cueing for RW placement and step sequencing.   Wheelchair Mobility    Modified Rankin (Stroke Patients Only)       Balance Overall balance assessment: Needs assistance Sitting-balance support: No upper extremity supported;Feet supported Sitting balance-Leahy Scale: Good     Standing balance support: Bilateral upper extremity supported;During functional activity   Standing balance comment: Able to stand without UE support and ambulated with light-mod BUE from the RW                            Cognition Arousal/Alertness: Awake/alert Behavior During Therapy: [facility] for tasks assessed/performed Overall Cognitive Status: Within Functional Limits for tasks assessed                                 General Comments: Able to recall and maintain precautions well.      Exercises Total Joint Exercises Ankle Circles/Pumps: AROM;Strengthening;Both;10 reps Quad Sets: Strengthening;Both;10 reps Gluteal Sets: Strengthening;Both;10 reps Towel Squeeze: Strengthening;Both;10 reps Heel Slides: AROM;Strengthening;Both;10 reps;Supine Hip ABduction/ADduction: AROM;Strengthening;Both;10 reps Straight Leg Raises: AROM;Strengthening;Both;10 reps;Supine Long Arc Quad: AROM;Strengthening;Both;10 reps Other Exercises Other Exercises: Verbally and visually demonstrated car transfer sequencing Other Exercises: Verbal and visual explanation/demonstartion of posterior hip precautions w/ quizzing and teachback Other Exercises: Transfer training with post hip precautions Other Exercises: L turn training with post hip precautions  General Comments        Pertinent Vitals/Pain Pain Assessment: No/denies pain Pain  Intervention(s): Monitored during session;Premedicated before session    Home Living                      Prior Function            PT Goals (current goals can now be found in the care plan section) Progress towards PT goals: Progressing toward goals    Frequency    BID      PT Plan Current plan remains appropriate    Co-evaluation              AM-PAC PT "6 Clicks" Mobility   Outcome Measure  Help needed turning from your back to your side while in a flat bed without using bedrails?: None Help needed moving from lying on your back to sitting on the side of a flat bed without using bedrails?: A Little Help needed moving to and from a bed to a chair (including a wheelchair)?: A Little Help needed standing up from a chair using your arms (e.g., wheelchair or bedside chair)?: A Little Help needed to walk in hospital room?: A Little Help needed climbing 3-5 steps with a railing? : A Little 6 Click Score: 19    End of Session Equipment Utilized During Treatment: Gait belt Activity Tolerance: Patient tolerated treatment well Patient left: in chair;with call bell/phone within reach;with chair alarm set;with nursing/sitter in room Nurse Communication: Mobility status;Weight bearing status;Precautions PT Visit Diagnosis: Other abnormalities of gait and mobility (R26.89);Muscle weakness (generalized) (M62.81);Unsteadiness on feet (R26.81);Pain Pain - Right/Left: Left Pain - part of body: Hip     Time: [id number] PT Time Calculation (min) (ACUTE ONLY): 41 min  Charges:                        Annabelle Harman, SPT 01/03/20 12:28 PM

## 2020-01-03 NOTE — Progress Notes (Signed)
Discharge Note: Reviewed med list and discharge orders. Pt. Verbalized understanding and Demonstrated Lovenox injection. Honeycomb dressing in place. PA removed Hemovac. Obtained vitals. Pt. D/c with a walker. Staff wheeled Pt. Out. Pt transported via private vehicle by son, Jenny Reichmann.

## 2020-01-04 DIAGNOSIS — M150 Primary generalized (osteo)arthritis: Secondary | ICD-10-CM | POA: Diagnosis not present

## 2020-01-04 DIAGNOSIS — I739 Peripheral vascular disease, unspecified: Secondary | ICD-10-CM | POA: Diagnosis not present

## 2020-01-04 DIAGNOSIS — E785 Hyperlipidemia, unspecified: Secondary | ICD-10-CM | POA: Diagnosis not present

## 2020-01-04 DIAGNOSIS — H8010 Otosclerosis involving oval window, obliterative, unspecified ear: Secondary | ICD-10-CM | POA: Diagnosis not present

## 2020-01-04 DIAGNOSIS — I10 Essential (primary) hypertension: Secondary | ICD-10-CM | POA: Diagnosis not present

## 2020-01-04 DIAGNOSIS — R251 Tremor, unspecified: Secondary | ICD-10-CM | POA: Diagnosis not present

## 2020-01-04 DIAGNOSIS — M5136 Other intervertebral disc degeneration, lumbar region: Secondary | ICD-10-CM | POA: Diagnosis not present

## 2020-01-04 DIAGNOSIS — Z471 Aftercare following joint replacement surgery: Secondary | ICD-10-CM | POA: Diagnosis not present

## 2020-01-04 DIAGNOSIS — K219 Gastro-esophageal reflux disease without esophagitis: Secondary | ICD-10-CM | POA: Diagnosis not present

## 2020-01-07 DIAGNOSIS — M150 Primary generalized (osteo)arthritis: Secondary | ICD-10-CM | POA: Diagnosis not present

## 2020-01-07 DIAGNOSIS — I739 Peripheral vascular disease, unspecified: Secondary | ICD-10-CM | POA: Diagnosis not present

## 2020-01-07 DIAGNOSIS — R251 Tremor, unspecified: Secondary | ICD-10-CM | POA: Diagnosis not present

## 2020-01-07 DIAGNOSIS — Z471 Aftercare following joint replacement surgery: Secondary | ICD-10-CM | POA: Diagnosis not present

## 2020-01-07 DIAGNOSIS — M5136 Other intervertebral disc degeneration, lumbar region: Secondary | ICD-10-CM | POA: Diagnosis not present

## 2020-01-07 DIAGNOSIS — I10 Essential (primary) hypertension: Secondary | ICD-10-CM | POA: Diagnosis not present

## 2020-01-07 DIAGNOSIS — H8010 Otosclerosis involving oval window, obliterative, unspecified ear: Secondary | ICD-10-CM | POA: Diagnosis not present

## 2020-01-07 DIAGNOSIS — E785 Hyperlipidemia, unspecified: Secondary | ICD-10-CM | POA: Diagnosis not present

## 2020-01-07 DIAGNOSIS — K219 Gastro-esophageal reflux disease without esophagitis: Secondary | ICD-10-CM | POA: Diagnosis not present

## 2020-01-08 DIAGNOSIS — R251 Tremor, unspecified: Secondary | ICD-10-CM | POA: Diagnosis not present

## 2020-01-08 DIAGNOSIS — I10 Essential (primary) hypertension: Secondary | ICD-10-CM | POA: Diagnosis not present

## 2020-01-08 DIAGNOSIS — H8010 Otosclerosis involving oval window, obliterative, unspecified ear: Secondary | ICD-10-CM | POA: Diagnosis not present

## 2020-01-08 DIAGNOSIS — M5136 Other intervertebral disc degeneration, lumbar region: Secondary | ICD-10-CM | POA: Diagnosis not present

## 2020-01-08 DIAGNOSIS — Z471 Aftercare following joint replacement surgery: Secondary | ICD-10-CM | POA: Diagnosis not present

## 2020-01-08 DIAGNOSIS — K219 Gastro-esophageal reflux disease without esophagitis: Secondary | ICD-10-CM | POA: Diagnosis not present

## 2020-01-08 DIAGNOSIS — I739 Peripheral vascular disease, unspecified: Secondary | ICD-10-CM | POA: Diagnosis not present

## 2020-01-08 DIAGNOSIS — E785 Hyperlipidemia, unspecified: Secondary | ICD-10-CM | POA: Diagnosis not present

## 2020-01-08 DIAGNOSIS — M150 Primary generalized (osteo)arthritis: Secondary | ICD-10-CM | POA: Diagnosis not present

## 2020-01-10 DIAGNOSIS — Z471 Aftercare following joint replacement surgery: Secondary | ICD-10-CM | POA: Diagnosis not present

## 2020-01-10 DIAGNOSIS — I10 Essential (primary) hypertension: Secondary | ICD-10-CM | POA: Diagnosis not present

## 2020-01-10 DIAGNOSIS — E785 Hyperlipidemia, unspecified: Secondary | ICD-10-CM | POA: Diagnosis not present

## 2020-01-10 DIAGNOSIS — H8010 Otosclerosis involving oval window, obliterative, unspecified ear: Secondary | ICD-10-CM | POA: Diagnosis not present

## 2020-01-10 DIAGNOSIS — R251 Tremor, unspecified: Secondary | ICD-10-CM | POA: Diagnosis not present

## 2020-01-10 DIAGNOSIS — K219 Gastro-esophageal reflux disease without esophagitis: Secondary | ICD-10-CM | POA: Diagnosis not present

## 2020-01-10 DIAGNOSIS — M5136 Other intervertebral disc degeneration, lumbar region: Secondary | ICD-10-CM | POA: Diagnosis not present

## 2020-01-10 DIAGNOSIS — I739 Peripheral vascular disease, unspecified: Secondary | ICD-10-CM | POA: Diagnosis not present

## 2020-01-10 DIAGNOSIS — M150 Primary generalized (osteo)arthritis: Secondary | ICD-10-CM | POA: Diagnosis not present

## 2020-01-13 DIAGNOSIS — I10 Essential (primary) hypertension: Secondary | ICD-10-CM | POA: Diagnosis not present

## 2020-01-13 DIAGNOSIS — R251 Tremor, unspecified: Secondary | ICD-10-CM | POA: Diagnosis not present

## 2020-01-13 DIAGNOSIS — M150 Primary generalized (osteo)arthritis: Secondary | ICD-10-CM | POA: Diagnosis not present

## 2020-01-13 DIAGNOSIS — I739 Peripheral vascular disease, unspecified: Secondary | ICD-10-CM | POA: Diagnosis not present

## 2020-01-13 DIAGNOSIS — K219 Gastro-esophageal reflux disease without esophagitis: Secondary | ICD-10-CM | POA: Diagnosis not present

## 2020-01-13 DIAGNOSIS — Z471 Aftercare following joint replacement surgery: Secondary | ICD-10-CM | POA: Diagnosis not present

## 2020-01-13 DIAGNOSIS — M5136 Other intervertebral disc degeneration, lumbar region: Secondary | ICD-10-CM | POA: Diagnosis not present

## 2020-01-13 DIAGNOSIS — H8010 Otosclerosis involving oval window, obliterative, unspecified ear: Secondary | ICD-10-CM | POA: Diagnosis not present

## 2020-01-13 DIAGNOSIS — E785 Hyperlipidemia, unspecified: Secondary | ICD-10-CM | POA: Diagnosis not present

## 2020-01-15 DIAGNOSIS — H8010 Otosclerosis involving oval window, obliterative, unspecified ear: Secondary | ICD-10-CM | POA: Diagnosis not present

## 2020-01-15 DIAGNOSIS — E785 Hyperlipidemia, unspecified: Secondary | ICD-10-CM | POA: Diagnosis not present

## 2020-01-15 DIAGNOSIS — I739 Peripheral vascular disease, unspecified: Secondary | ICD-10-CM | POA: Diagnosis not present

## 2020-01-15 DIAGNOSIS — R251 Tremor, unspecified: Secondary | ICD-10-CM | POA: Diagnosis not present

## 2020-01-15 DIAGNOSIS — Z471 Aftercare following joint replacement surgery: Secondary | ICD-10-CM | POA: Diagnosis not present

## 2020-01-15 DIAGNOSIS — I10 Essential (primary) hypertension: Secondary | ICD-10-CM | POA: Diagnosis not present

## 2020-01-15 DIAGNOSIS — M5136 Other intervertebral disc degeneration, lumbar region: Secondary | ICD-10-CM | POA: Diagnosis not present

## 2020-01-15 DIAGNOSIS — M150 Primary generalized (osteo)arthritis: Secondary | ICD-10-CM | POA: Diagnosis not present

## 2020-01-15 DIAGNOSIS — K219 Gastro-esophageal reflux disease without esophagitis: Secondary | ICD-10-CM | POA: Diagnosis not present

## 2020-01-16 DIAGNOSIS — M5136 Other intervertebral disc degeneration, lumbar region: Secondary | ICD-10-CM | POA: Diagnosis not present

## 2020-01-16 DIAGNOSIS — I10 Essential (primary) hypertension: Secondary | ICD-10-CM | POA: Diagnosis not present

## 2020-01-16 DIAGNOSIS — E785 Hyperlipidemia, unspecified: Secondary | ICD-10-CM | POA: Diagnosis not present

## 2020-01-16 DIAGNOSIS — H8010 Otosclerosis involving oval window, obliterative, unspecified ear: Secondary | ICD-10-CM | POA: Diagnosis not present

## 2020-01-16 DIAGNOSIS — I739 Peripheral vascular disease, unspecified: Secondary | ICD-10-CM | POA: Diagnosis not present

## 2020-01-16 DIAGNOSIS — M150 Primary generalized (osteo)arthritis: Secondary | ICD-10-CM | POA: Diagnosis not present

## 2020-01-16 DIAGNOSIS — K219 Gastro-esophageal reflux disease without esophagitis: Secondary | ICD-10-CM | POA: Diagnosis not present

## 2020-01-16 DIAGNOSIS — R251 Tremor, unspecified: Secondary | ICD-10-CM | POA: Diagnosis not present

## 2020-01-16 DIAGNOSIS — Z471 Aftercare following joint replacement surgery: Secondary | ICD-10-CM | POA: Diagnosis not present

## 2020-01-21 DIAGNOSIS — I10 Essential (primary) hypertension: Secondary | ICD-10-CM | POA: Diagnosis not present

## 2020-01-21 DIAGNOSIS — K219 Gastro-esophageal reflux disease without esophagitis: Secondary | ICD-10-CM | POA: Diagnosis not present

## 2020-01-21 DIAGNOSIS — E785 Hyperlipidemia, unspecified: Secondary | ICD-10-CM | POA: Diagnosis not present

## 2020-01-21 DIAGNOSIS — M5136 Other intervertebral disc degeneration, lumbar region: Secondary | ICD-10-CM | POA: Diagnosis not present

## 2020-01-21 DIAGNOSIS — Z471 Aftercare following joint replacement surgery: Secondary | ICD-10-CM | POA: Diagnosis not present

## 2020-01-21 DIAGNOSIS — M150 Primary generalized (osteo)arthritis: Secondary | ICD-10-CM | POA: Diagnosis not present

## 2020-01-21 DIAGNOSIS — H8010 Otosclerosis involving oval window, obliterative, unspecified ear: Secondary | ICD-10-CM | POA: Diagnosis not present

## 2020-01-21 DIAGNOSIS — R251 Tremor, unspecified: Secondary | ICD-10-CM | POA: Diagnosis not present

## 2020-01-21 DIAGNOSIS — I739 Peripheral vascular disease, unspecified: Secondary | ICD-10-CM | POA: Diagnosis not present

## 2020-01-22 DIAGNOSIS — K219 Gastro-esophageal reflux disease without esophagitis: Secondary | ICD-10-CM | POA: Diagnosis not present

## 2020-01-22 DIAGNOSIS — E785 Hyperlipidemia, unspecified: Secondary | ICD-10-CM | POA: Diagnosis not present

## 2020-01-22 DIAGNOSIS — M150 Primary generalized (osteo)arthritis: Secondary | ICD-10-CM | POA: Diagnosis not present

## 2020-01-22 DIAGNOSIS — Z471 Aftercare following joint replacement surgery: Secondary | ICD-10-CM | POA: Diagnosis not present

## 2020-01-22 DIAGNOSIS — M5136 Other intervertebral disc degeneration, lumbar region: Secondary | ICD-10-CM | POA: Diagnosis not present

## 2020-01-22 DIAGNOSIS — R251 Tremor, unspecified: Secondary | ICD-10-CM | POA: Diagnosis not present

## 2020-01-22 DIAGNOSIS — I739 Peripheral vascular disease, unspecified: Secondary | ICD-10-CM | POA: Diagnosis not present

## 2020-01-22 DIAGNOSIS — I10 Essential (primary) hypertension: Secondary | ICD-10-CM | POA: Diagnosis not present

## 2020-01-22 DIAGNOSIS — H8010 Otosclerosis involving oval window, obliterative, unspecified ear: Secondary | ICD-10-CM | POA: Diagnosis not present

## 2020-01-24 DIAGNOSIS — R251 Tremor, unspecified: Secondary | ICD-10-CM | POA: Diagnosis not present

## 2020-01-24 DIAGNOSIS — E785 Hyperlipidemia, unspecified: Secondary | ICD-10-CM | POA: Diagnosis not present

## 2020-01-24 DIAGNOSIS — Z471 Aftercare following joint replacement surgery: Secondary | ICD-10-CM | POA: Diagnosis not present

## 2020-01-24 DIAGNOSIS — I10 Essential (primary) hypertension: Secondary | ICD-10-CM | POA: Diagnosis not present

## 2020-01-24 DIAGNOSIS — I739 Peripheral vascular disease, unspecified: Secondary | ICD-10-CM | POA: Diagnosis not present

## 2020-01-24 DIAGNOSIS — H8010 Otosclerosis involving oval window, obliterative, unspecified ear: Secondary | ICD-10-CM | POA: Diagnosis not present

## 2020-01-24 DIAGNOSIS — M5136 Other intervertebral disc degeneration, lumbar region: Secondary | ICD-10-CM | POA: Diagnosis not present

## 2020-01-24 DIAGNOSIS — M150 Primary generalized (osteo)arthritis: Secondary | ICD-10-CM | POA: Diagnosis not present

## 2020-01-24 DIAGNOSIS — K219 Gastro-esophageal reflux disease without esophagitis: Secondary | ICD-10-CM | POA: Diagnosis not present

## 2020-01-27 DIAGNOSIS — K219 Gastro-esophageal reflux disease without esophagitis: Secondary | ICD-10-CM | POA: Diagnosis not present

## 2020-01-27 DIAGNOSIS — I10 Essential (primary) hypertension: Secondary | ICD-10-CM | POA: Diagnosis not present

## 2020-01-27 DIAGNOSIS — E785 Hyperlipidemia, unspecified: Secondary | ICD-10-CM | POA: Diagnosis not present

## 2020-01-27 DIAGNOSIS — M150 Primary generalized (osteo)arthritis: Secondary | ICD-10-CM | POA: Diagnosis not present

## 2020-01-27 DIAGNOSIS — M5136 Other intervertebral disc degeneration, lumbar region: Secondary | ICD-10-CM | POA: Diagnosis not present

## 2020-01-27 DIAGNOSIS — H8010 Otosclerosis involving oval window, obliterative, unspecified ear: Secondary | ICD-10-CM | POA: Diagnosis not present

## 2020-01-27 DIAGNOSIS — I739 Peripheral vascular disease, unspecified: Secondary | ICD-10-CM | POA: Diagnosis not present

## 2020-01-27 DIAGNOSIS — R251 Tremor, unspecified: Secondary | ICD-10-CM | POA: Diagnosis not present

## 2020-01-27 DIAGNOSIS — Z471 Aftercare following joint replacement surgery: Secondary | ICD-10-CM | POA: Diagnosis not present

## 2020-01-29 DIAGNOSIS — M150 Primary generalized (osteo)arthritis: Secondary | ICD-10-CM | POA: Diagnosis not present

## 2020-01-29 DIAGNOSIS — R251 Tremor, unspecified: Secondary | ICD-10-CM | POA: Diagnosis not present

## 2020-01-29 DIAGNOSIS — H8010 Otosclerosis involving oval window, obliterative, unspecified ear: Secondary | ICD-10-CM | POA: Diagnosis not present

## 2020-01-29 DIAGNOSIS — I739 Peripheral vascular disease, unspecified: Secondary | ICD-10-CM | POA: Diagnosis not present

## 2020-01-29 DIAGNOSIS — Z471 Aftercare following joint replacement surgery: Secondary | ICD-10-CM | POA: Diagnosis not present

## 2020-01-29 DIAGNOSIS — K219 Gastro-esophageal reflux disease without esophagitis: Secondary | ICD-10-CM | POA: Diagnosis not present

## 2020-01-29 DIAGNOSIS — E785 Hyperlipidemia, unspecified: Secondary | ICD-10-CM | POA: Diagnosis not present

## 2020-01-29 DIAGNOSIS — M5136 Other intervertebral disc degeneration, lumbar region: Secondary | ICD-10-CM | POA: Diagnosis not present

## 2020-01-29 DIAGNOSIS — I10 Essential (primary) hypertension: Secondary | ICD-10-CM | POA: Diagnosis not present

## 2020-01-31 DIAGNOSIS — K219 Gastro-esophageal reflux disease without esophagitis: Secondary | ICD-10-CM | POA: Diagnosis not present

## 2020-01-31 DIAGNOSIS — R251 Tremor, unspecified: Secondary | ICD-10-CM | POA: Diagnosis not present

## 2020-01-31 DIAGNOSIS — M5136 Other intervertebral disc degeneration, lumbar region: Secondary | ICD-10-CM | POA: Diagnosis not present

## 2020-01-31 DIAGNOSIS — Z471 Aftercare following joint replacement surgery: Secondary | ICD-10-CM | POA: Diagnosis not present

## 2020-01-31 DIAGNOSIS — M150 Primary generalized (osteo)arthritis: Secondary | ICD-10-CM | POA: Diagnosis not present

## 2020-01-31 DIAGNOSIS — I739 Peripheral vascular disease, unspecified: Secondary | ICD-10-CM | POA: Diagnosis not present

## 2020-01-31 DIAGNOSIS — I10 Essential (primary) hypertension: Secondary | ICD-10-CM | POA: Diagnosis not present

## 2020-01-31 DIAGNOSIS — E785 Hyperlipidemia, unspecified: Secondary | ICD-10-CM | POA: Diagnosis not present

## 2020-01-31 DIAGNOSIS — H8010 Otosclerosis involving oval window, obliterative, unspecified ear: Secondary | ICD-10-CM | POA: Diagnosis not present

## 2020-02-03 DIAGNOSIS — I739 Peripheral vascular disease, unspecified: Secondary | ICD-10-CM | POA: Diagnosis not present

## 2020-02-03 DIAGNOSIS — Z471 Aftercare following joint replacement surgery: Secondary | ICD-10-CM | POA: Diagnosis not present

## 2020-02-03 DIAGNOSIS — E785 Hyperlipidemia, unspecified: Secondary | ICD-10-CM | POA: Diagnosis not present

## 2020-02-03 DIAGNOSIS — K219 Gastro-esophageal reflux disease without esophagitis: Secondary | ICD-10-CM | POA: Diagnosis not present

## 2020-02-03 DIAGNOSIS — M5136 Other intervertebral disc degeneration, lumbar region: Secondary | ICD-10-CM | POA: Diagnosis not present

## 2020-02-03 DIAGNOSIS — R251 Tremor, unspecified: Secondary | ICD-10-CM | POA: Diagnosis not present

## 2020-02-03 DIAGNOSIS — I10 Essential (primary) hypertension: Secondary | ICD-10-CM | POA: Diagnosis not present

## 2020-02-03 DIAGNOSIS — H8010 Otosclerosis involving oval window, obliterative, unspecified ear: Secondary | ICD-10-CM | POA: Diagnosis not present

## 2020-02-03 DIAGNOSIS — M150 Primary generalized (osteo)arthritis: Secondary | ICD-10-CM | POA: Diagnosis not present

## 2020-02-12 DIAGNOSIS — Z471 Aftercare following joint replacement surgery: Secondary | ICD-10-CM | POA: Diagnosis not present

## 2020-02-12 DIAGNOSIS — I739 Peripheral vascular disease, unspecified: Secondary | ICD-10-CM | POA: Diagnosis not present

## 2020-02-12 DIAGNOSIS — M150 Primary generalized (osteo)arthritis: Secondary | ICD-10-CM | POA: Diagnosis not present

## 2020-02-12 DIAGNOSIS — R251 Tremor, unspecified: Secondary | ICD-10-CM | POA: Diagnosis not present

## 2020-02-12 DIAGNOSIS — K219 Gastro-esophageal reflux disease without esophagitis: Secondary | ICD-10-CM | POA: Diagnosis not present

## 2020-02-12 DIAGNOSIS — H8010 Otosclerosis involving oval window, obliterative, unspecified ear: Secondary | ICD-10-CM | POA: Diagnosis not present

## 2020-02-12 DIAGNOSIS — E785 Hyperlipidemia, unspecified: Secondary | ICD-10-CM | POA: Diagnosis not present

## 2020-02-12 DIAGNOSIS — M5136 Other intervertebral disc degeneration, lumbar region: Secondary | ICD-10-CM | POA: Diagnosis not present

## 2020-02-12 DIAGNOSIS — I10 Essential (primary) hypertension: Secondary | ICD-10-CM | POA: Diagnosis not present

## 2020-02-13 DIAGNOSIS — Z96642 Presence of left artificial hip joint: Secondary | ICD-10-CM | POA: Diagnosis not present

## 2020-02-13 DIAGNOSIS — M1612 Unilateral primary osteoarthritis, left hip: Secondary | ICD-10-CM | POA: Diagnosis not present

## 2020-02-25 DIAGNOSIS — M48062 Spinal stenosis, lumbar region with neurogenic claudication: Secondary | ICD-10-CM | POA: Diagnosis not present

## 2020-02-25 DIAGNOSIS — M5136 Other intervertebral disc degeneration, lumbar region: Secondary | ICD-10-CM | POA: Diagnosis not present

## 2020-02-25 DIAGNOSIS — I10 Essential (primary) hypertension: Secondary | ICD-10-CM | POA: Diagnosis not present

## 2020-02-25 DIAGNOSIS — E7849 Other hyperlipidemia: Secondary | ICD-10-CM | POA: Diagnosis not present

## 2020-02-25 DIAGNOSIS — K219 Gastro-esophageal reflux disease without esophagitis: Secondary | ICD-10-CM | POA: Diagnosis not present

## 2020-02-25 DIAGNOSIS — R739 Hyperglycemia, unspecified: Secondary | ICD-10-CM | POA: Diagnosis not present

## 2020-03-03 DIAGNOSIS — Z471 Aftercare following joint replacement surgery: Secondary | ICD-10-CM | POA: Diagnosis not present

## 2020-03-25 DIAGNOSIS — H524 Presbyopia: Secondary | ICD-10-CM | POA: Diagnosis not present

## 2020-04-21 DIAGNOSIS — Z0100 Encounter for examination of eyes and vision without abnormal findings: Secondary | ICD-10-CM | POA: Diagnosis not present

## 2020-05-19 DIAGNOSIS — D2261 Melanocytic nevi of right upper limb, including shoulder: Secondary | ICD-10-CM | POA: Diagnosis not present

## 2020-05-19 DIAGNOSIS — D2271 Melanocytic nevi of right lower limb, including hip: Secondary | ICD-10-CM | POA: Diagnosis not present

## 2020-05-19 DIAGNOSIS — D485 Neoplasm of uncertain behavior of skin: Secondary | ICD-10-CM | POA: Diagnosis not present

## 2020-05-19 DIAGNOSIS — D225 Melanocytic nevi of trunk: Secondary | ICD-10-CM | POA: Diagnosis not present

## 2020-05-19 DIAGNOSIS — D2262 Melanocytic nevi of left upper limb, including shoulder: Secondary | ICD-10-CM | POA: Diagnosis not present

## 2020-05-19 DIAGNOSIS — L728 Other follicular cysts of the skin and subcutaneous tissue: Secondary | ICD-10-CM | POA: Diagnosis not present

## 2020-05-19 DIAGNOSIS — D2272 Melanocytic nevi of left lower limb, including hip: Secondary | ICD-10-CM | POA: Diagnosis not present

## 2020-06-08 DIAGNOSIS — Z8673 Personal history of transient ischemic attack (TIA), and cerebral infarction without residual deficits: Secondary | ICD-10-CM | POA: Diagnosis not present

## 2020-06-08 DIAGNOSIS — I10 Essential (primary) hypertension: Secondary | ICD-10-CM | POA: Diagnosis not present

## 2020-06-08 DIAGNOSIS — E7849 Other hyperlipidemia: Secondary | ICD-10-CM | POA: Diagnosis not present

## 2020-09-01 DIAGNOSIS — N1831 Chronic kidney disease, stage 3a: Secondary | ICD-10-CM | POA: Insufficient documentation

## 2020-09-01 DIAGNOSIS — K219 Gastro-esophageal reflux disease without esophagitis: Secondary | ICD-10-CM | POA: Diagnosis not present

## 2020-09-01 DIAGNOSIS — I129 Hypertensive chronic kidney disease with stage 1 through stage 4 chronic kidney disease, or unspecified chronic kidney disease: Secondary | ICD-10-CM | POA: Diagnosis not present

## 2020-09-01 DIAGNOSIS — M5136 Other intervertebral disc degeneration, lumbar region: Secondary | ICD-10-CM | POA: Diagnosis not present

## 2020-09-01 DIAGNOSIS — Z23 Encounter for immunization: Secondary | ICD-10-CM | POA: Diagnosis not present

## 2020-09-01 DIAGNOSIS — R739 Hyperglycemia, unspecified: Secondary | ICD-10-CM | POA: Diagnosis not present

## 2020-09-01 DIAGNOSIS — M48062 Spinal stenosis, lumbar region with neurogenic claudication: Secondary | ICD-10-CM | POA: Diagnosis not present

## 2020-09-01 DIAGNOSIS — R2232 Localized swelling, mass and lump, left upper limb: Secondary | ICD-10-CM | POA: Diagnosis not present

## 2020-09-01 DIAGNOSIS — Z0000 Encounter for general adult medical examination without abnormal findings: Secondary | ICD-10-CM | POA: Diagnosis not present

## 2020-09-01 DIAGNOSIS — N183 Chronic kidney disease, stage 3 unspecified: Secondary | ICD-10-CM | POA: Diagnosis not present

## 2020-09-01 DIAGNOSIS — E7849 Other hyperlipidemia: Secondary | ICD-10-CM | POA: Diagnosis not present

## 2020-09-01 DIAGNOSIS — E785 Hyperlipidemia, unspecified: Secondary | ICD-10-CM | POA: Diagnosis not present

## 2020-09-02 ENCOUNTER — Other Ambulatory Visit: Payer: Self-pay | Admitting: Internal Medicine

## 2020-09-02 DIAGNOSIS — R2232 Localized swelling, mass and lump, left upper limb: Secondary | ICD-10-CM

## 2020-09-03 ENCOUNTER — Other Ambulatory Visit: Payer: Self-pay | Admitting: Internal Medicine

## 2020-09-03 DIAGNOSIS — Z1231 Encounter for screening mammogram for malignant neoplasm of breast: Secondary | ICD-10-CM

## 2020-09-08 ENCOUNTER — Other Ambulatory Visit: Payer: Self-pay

## 2020-09-08 ENCOUNTER — Ambulatory Visit
Admission: RE | Admit: 2020-09-08 | Discharge: 2020-09-08 | Disposition: A | Discharge status: Home / Self Care | Payer: Medicare HMO | Source: Ambulatory Visit | Attending: Internal Medicine | Admitting: Internal Medicine

## 2020-09-08 DIAGNOSIS — R2232 Localized swelling, mass and lump, left upper limb: Secondary | ICD-10-CM | POA: Insufficient documentation

## 2020-10-14 ENCOUNTER — Ambulatory Visit
Admission: RE | Admit: 2020-10-14 | Discharge: 2020-10-14 | Disposition: A | Discharge status: Home / Self Care | Payer: Medicare HMO | Source: Ambulatory Visit | Attending: Internal Medicine | Admitting: Internal Medicine

## 2020-10-14 ENCOUNTER — Other Ambulatory Visit: Payer: Self-pay

## 2020-10-14 DIAGNOSIS — Z1231 Encounter for screening mammogram for malignant neoplasm of breast: Secondary | ICD-10-CM | POA: Insufficient documentation

## 2020-11-23 DIAGNOSIS — M25561 Pain in right knee: Secondary | ICD-10-CM | POA: Diagnosis not present

## 2020-11-23 DIAGNOSIS — M1711 Unilateral primary osteoarthritis, right knee: Secondary | ICD-10-CM | POA: Diagnosis not present

## 2020-11-23 DIAGNOSIS — I739 Peripheral vascular disease, unspecified: Secondary | ICD-10-CM | POA: Diagnosis not present

## 2021-02-22 DIAGNOSIS — M1711 Unilateral primary osteoarthritis, right knee: Secondary | ICD-10-CM | POA: Diagnosis not present

## 2021-03-01 DIAGNOSIS — M5136 Other intervertebral disc degeneration, lumbar region: Secondary | ICD-10-CM | POA: Diagnosis not present

## 2021-03-01 DIAGNOSIS — E785 Hyperlipidemia, unspecified: Secondary | ICD-10-CM | POA: Diagnosis not present

## 2021-03-01 DIAGNOSIS — K219 Gastro-esophageal reflux disease without esophagitis: Secondary | ICD-10-CM | POA: Diagnosis not present

## 2021-03-01 DIAGNOSIS — N1831 Chronic kidney disease, stage 3a: Secondary | ICD-10-CM | POA: Diagnosis not present

## 2021-03-01 DIAGNOSIS — M159 Polyosteoarthritis, unspecified: Secondary | ICD-10-CM | POA: Diagnosis not present

## 2021-03-01 DIAGNOSIS — I10 Essential (primary) hypertension: Secondary | ICD-10-CM | POA: Diagnosis not present

## 2021-03-01 DIAGNOSIS — E7849 Other hyperlipidemia: Secondary | ICD-10-CM | POA: Diagnosis not present

## 2021-03-01 DIAGNOSIS — M48062 Spinal stenosis, lumbar region with neurogenic claudication: Secondary | ICD-10-CM | POA: Diagnosis not present

## 2021-03-01 DIAGNOSIS — R739 Hyperglycemia, unspecified: Secondary | ICD-10-CM | POA: Diagnosis not present

## 2021-03-01 DIAGNOSIS — I129 Hypertensive chronic kidney disease with stage 1 through stage 4 chronic kidney disease, or unspecified chronic kidney disease: Secondary | ICD-10-CM | POA: Diagnosis not present

## 2021-03-23 IMAGING — US US EXTREM UP*L* LTD
1 series · 13 of 13 positions shown · non-contrast
Comparison: None.

CLINICAL DATA: Mass in the left upper extremity.

EXAM:
ULTRASOUND LEFT UPPER EXTREMITY LIMITED
TECHNIQUE: Ultrasound examination of the upper extremity soft tissues was
performed in the area of clinical concern.

[Series 1: us soft tissue upper extremity limited left (non-v · 13 of 13 slices shown]
[im 1/13]
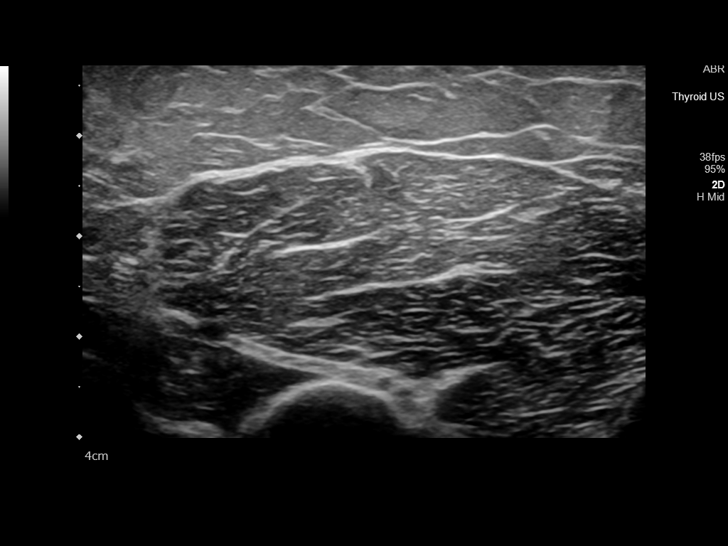
[im 2/13]
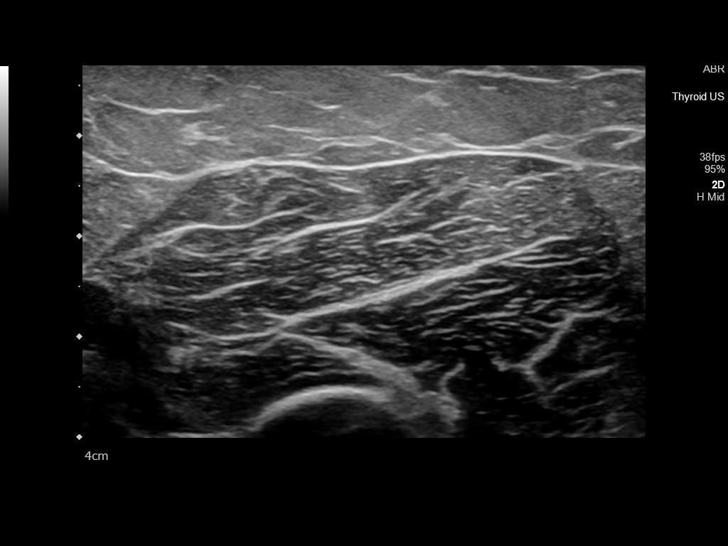
[im 3/13]
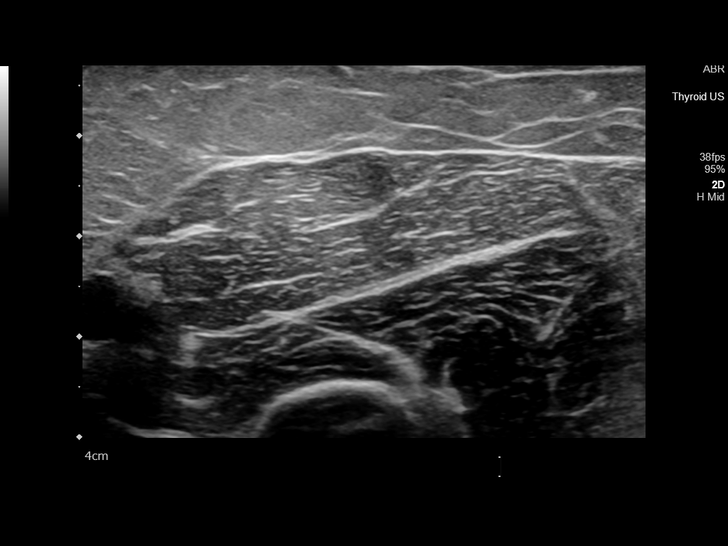
[im 4/13]
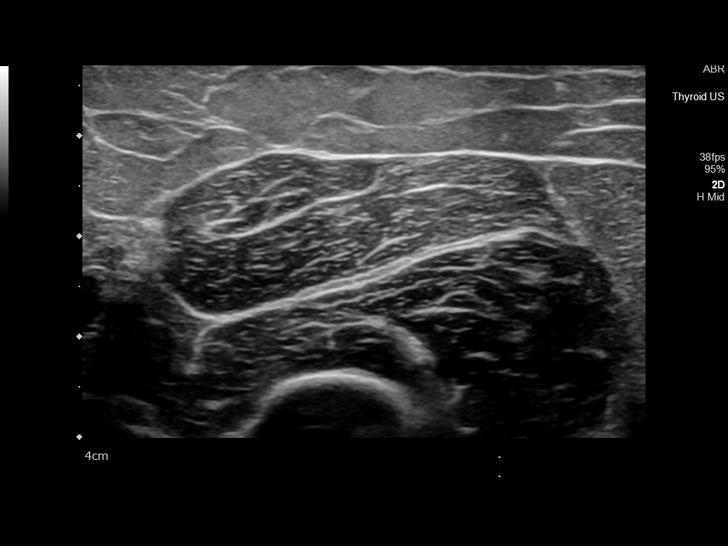
[im 5/13]
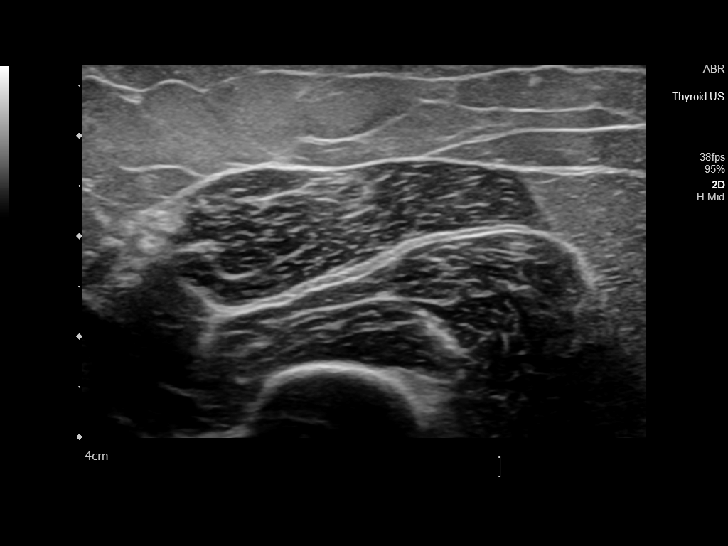
[im 6/13]
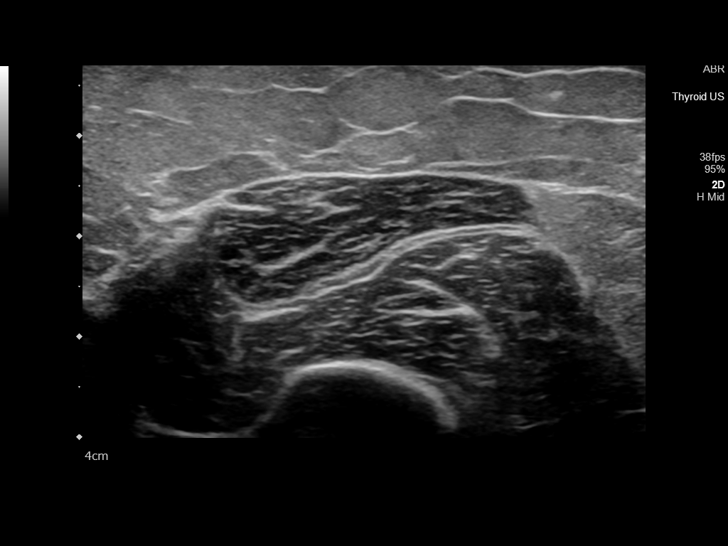
[im 7/13]
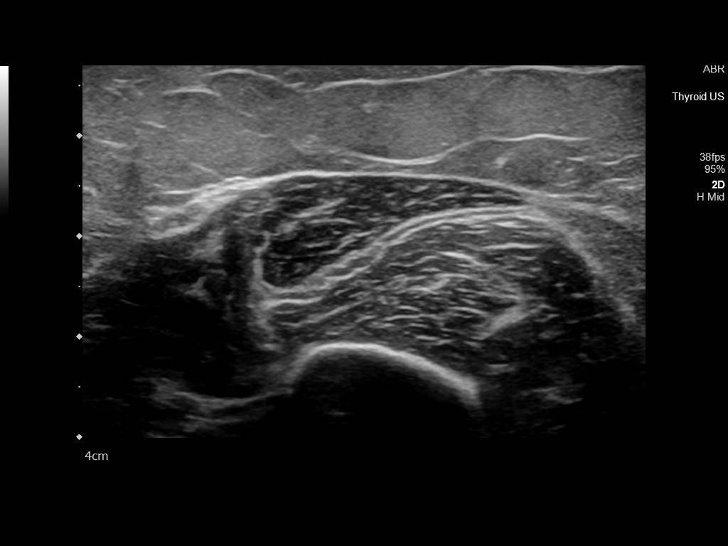
[im 8/13]
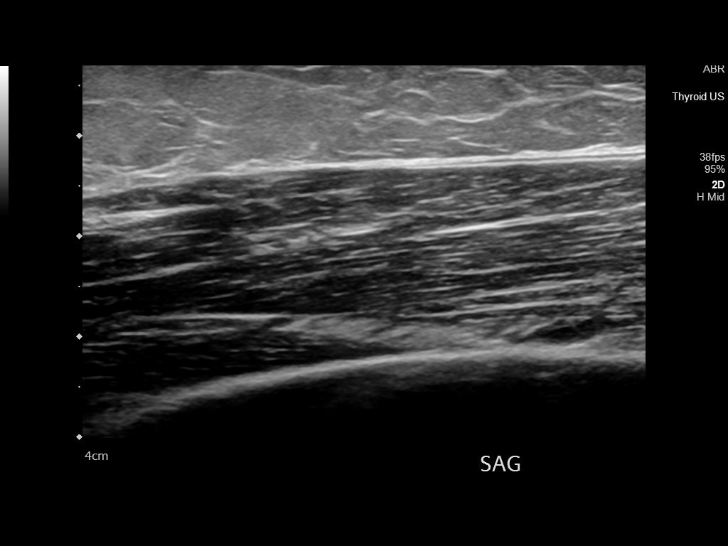
[im 9/13]
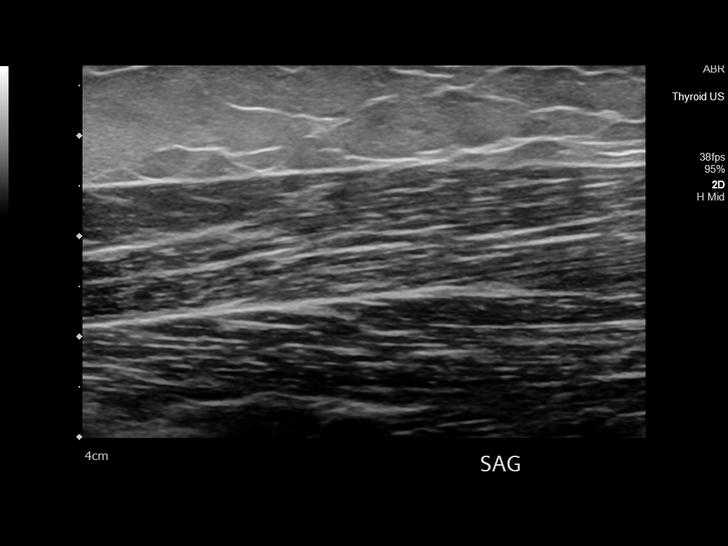
[im 10/13]
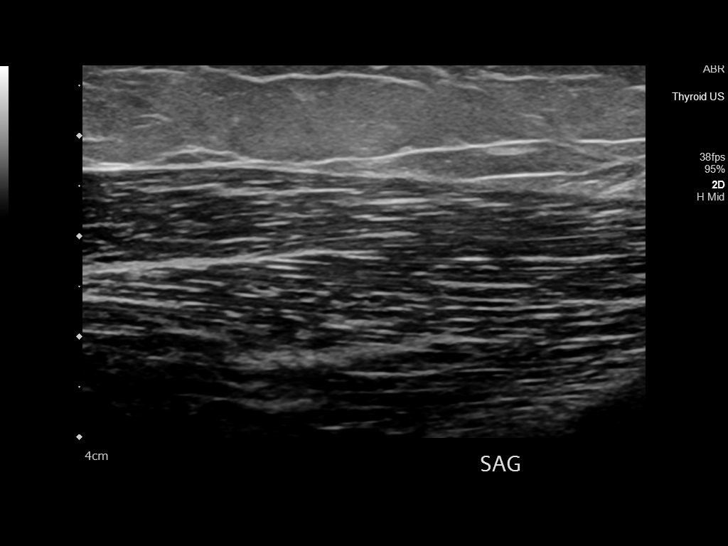
[im 11/13]
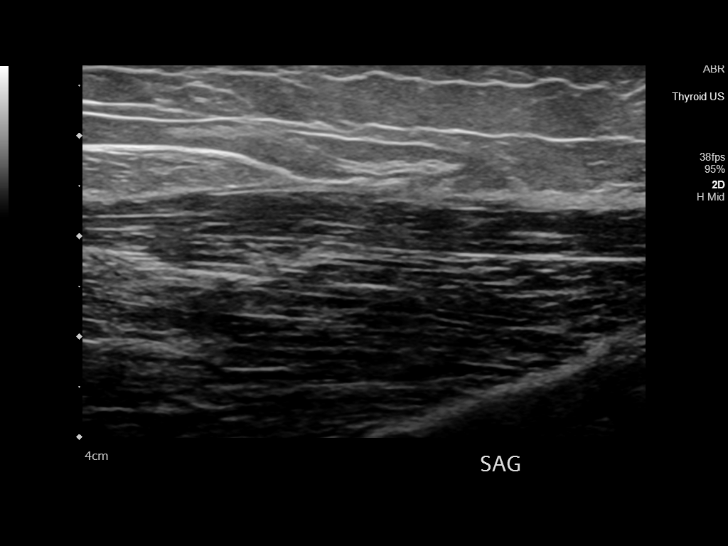
[im 12/13]
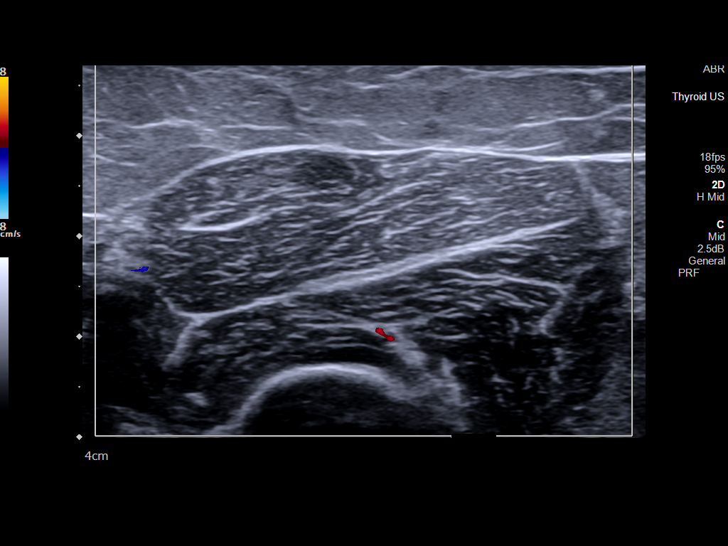
[im 13/13]
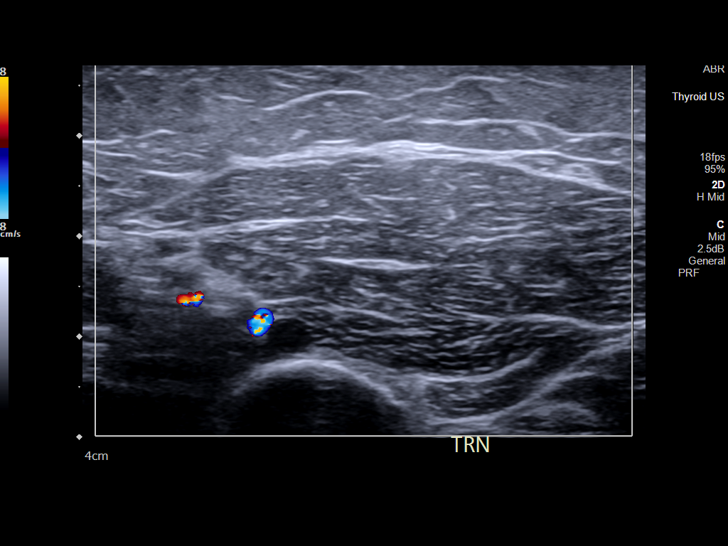

[13 of 13 positions shown; findings below may reference images not displayed]

FINDINGS: Real-time sonography of the left upper extremity was performed with
a high-frequency linear transducer.

No solid or cystic mass. No architectural distortion. Doppler
evaluation demonstrates no vascular abnormality.
IMPRESSION: No sonographic abnormality at the site of clinical concern.

## 2021-04-01 DIAGNOSIS — Z96642 Presence of left artificial hip joint: Secondary | ICD-10-CM | POA: Diagnosis not present

## 2021-05-02 DIAGNOSIS — R300 Dysuria: Secondary | ICD-10-CM | POA: Diagnosis not present

## 2021-05-02 DIAGNOSIS — N390 Urinary tract infection, site not specified: Secondary | ICD-10-CM | POA: Diagnosis not present

## 2021-05-25 DIAGNOSIS — H524 Presbyopia: Secondary | ICD-10-CM | POA: Diagnosis not present

## 2021-05-31 DIAGNOSIS — M1711 Unilateral primary osteoarthritis, right knee: Secondary | ICD-10-CM | POA: Diagnosis not present

## 2021-06-15 ENCOUNTER — Other Ambulatory Visit: Payer: Self-pay | Admitting: Internal Medicine

## 2021-06-15 DIAGNOSIS — Z1231 Encounter for screening mammogram for malignant neoplasm of breast: Secondary | ICD-10-CM

## 2021-08-10 DIAGNOSIS — M1711 Unilateral primary osteoarthritis, right knee: Secondary | ICD-10-CM | POA: Diagnosis not present

## 2021-08-13 DIAGNOSIS — M1711 Unilateral primary osteoarthritis, right knee: Secondary | ICD-10-CM | POA: Insufficient documentation

## 2021-08-17 DIAGNOSIS — M1711 Unilateral primary osteoarthritis, right knee: Secondary | ICD-10-CM | POA: Diagnosis not present

## 2021-08-24 DIAGNOSIS — M1711 Unilateral primary osteoarthritis, right knee: Secondary | ICD-10-CM | POA: Diagnosis not present

## 2021-08-31 DIAGNOSIS — M1711 Unilateral primary osteoarthritis, right knee: Secondary | ICD-10-CM | POA: Diagnosis not present

## 2021-09-06 DIAGNOSIS — N1831 Chronic kidney disease, stage 3a: Secondary | ICD-10-CM | POA: Diagnosis not present

## 2021-09-06 DIAGNOSIS — R739 Hyperglycemia, unspecified: Secondary | ICD-10-CM | POA: Diagnosis not present

## 2021-09-06 DIAGNOSIS — M48062 Spinal stenosis, lumbar region with neurogenic claudication: Secondary | ICD-10-CM | POA: Diagnosis not present

## 2021-09-06 DIAGNOSIS — E785 Hyperlipidemia, unspecified: Secondary | ICD-10-CM | POA: Diagnosis not present

## 2021-09-06 DIAGNOSIS — I10 Essential (primary) hypertension: Secondary | ICD-10-CM | POA: Diagnosis not present

## 2021-09-06 DIAGNOSIS — Z1389 Encounter for screening for other disorder: Secondary | ICD-10-CM | POA: Diagnosis not present

## 2021-09-06 DIAGNOSIS — E7849 Other hyperlipidemia: Secondary | ICD-10-CM | POA: Diagnosis not present

## 2021-09-06 DIAGNOSIS — Z0000 Encounter for general adult medical examination without abnormal findings: Secondary | ICD-10-CM | POA: Diagnosis not present

## 2021-09-06 DIAGNOSIS — I129 Hypertensive chronic kidney disease with stage 1 through stage 4 chronic kidney disease, or unspecified chronic kidney disease: Secondary | ICD-10-CM | POA: Diagnosis not present

## 2021-09-06 DIAGNOSIS — M159 Polyosteoarthritis, unspecified: Secondary | ICD-10-CM | POA: Diagnosis not present

## 2021-09-06 DIAGNOSIS — K219 Gastro-esophageal reflux disease without esophagitis: Secondary | ICD-10-CM | POA: Diagnosis not present

## 2021-10-07 DIAGNOSIS — N1831 Chronic kidney disease, stage 3a: Secondary | ICD-10-CM | POA: Diagnosis not present

## 2021-10-22 ENCOUNTER — Ambulatory Visit
Admission: RE | Admit: 2021-10-22 | Discharge: 2021-10-22 | Disposition: A | Discharge status: Home / Self Care | Payer: Medicare HMO | Source: Ambulatory Visit | Attending: Internal Medicine | Admitting: Internal Medicine

## 2021-10-22 ENCOUNTER — Other Ambulatory Visit: Payer: Self-pay

## 2021-10-22 DIAGNOSIS — Z1231 Encounter for screening mammogram for malignant neoplasm of breast: Secondary | ICD-10-CM | POA: Diagnosis not present

## 2022-03-02 DIAGNOSIS — M1711 Unilateral primary osteoarthritis, right knee: Secondary | ICD-10-CM | POA: Diagnosis not present

## 2022-03-02 DIAGNOSIS — I739 Peripheral vascular disease, unspecified: Secondary | ICD-10-CM | POA: Diagnosis not present

## 2022-03-08 DIAGNOSIS — Z8673 Personal history of transient ischemic attack (TIA), and cerebral infarction without residual deficits: Secondary | ICD-10-CM | POA: Diagnosis not present

## 2022-03-08 DIAGNOSIS — R7303 Prediabetes: Secondary | ICD-10-CM | POA: Diagnosis not present

## 2022-03-08 DIAGNOSIS — N1831 Chronic kidney disease, stage 3a: Secondary | ICD-10-CM | POA: Diagnosis not present

## 2022-03-08 DIAGNOSIS — N183 Chronic kidney disease, stage 3 unspecified: Secondary | ICD-10-CM | POA: Diagnosis not present

## 2022-03-08 DIAGNOSIS — I129 Hypertensive chronic kidney disease with stage 1 through stage 4 chronic kidney disease, or unspecified chronic kidney disease: Secondary | ICD-10-CM | POA: Diagnosis not present

## 2022-03-08 DIAGNOSIS — I739 Peripheral vascular disease, unspecified: Secondary | ICD-10-CM | POA: Diagnosis not present

## 2022-03-08 DIAGNOSIS — I10 Essential (primary) hypertension: Secondary | ICD-10-CM | POA: Diagnosis not present

## 2022-03-08 DIAGNOSIS — E785 Hyperlipidemia, unspecified: Secondary | ICD-10-CM | POA: Diagnosis not present

## 2022-03-08 DIAGNOSIS — M4800 Spinal stenosis, site unspecified: Secondary | ICD-10-CM | POA: Diagnosis not present

## 2022-03-08 DIAGNOSIS — E7849 Other hyperlipidemia: Secondary | ICD-10-CM | POA: Diagnosis not present

## 2022-03-24 DIAGNOSIS — Z96642 Presence of left artificial hip joint: Secondary | ICD-10-CM | POA: Diagnosis not present

## 2022-05-29 NOTE — Discharge Instructions (Signed)
Instructions after Total Knee Replacement   Dawn Vega, Jr., M.D.     Dept. of Orthopaedics & Sports Medicine  [facility]  [address]  [place], [place]  [address]  Phone: [phone]   Fax: [phone]    DIET: Drink plenty of non-alcoholic fluids. Resume your normal diet. Include foods high in fiber.  ACTIVITY:  You may use crutches or a walker with weight-bearing as tolerated, unless instructed otherwise. You may be weaned off of the walker or crutches by your Physical Therapist.  Do NOT place pillows under the knee. Anything placed under the knee could limit your ability to straighten the knee.   Continue doing gentle exercises. Exercising will reduce the pain and swelling, increase motion, and prevent muscle weakness.   Please continue to use the TED compression stockings for 6 weeks. You may remove the stockings at night, but should reapply them in the morning. Do not drive or operate any equipment until instructed.  WOUND CARE:  Continue to use the PolarCare or ice packs periodically to reduce pain and swelling. You may bathe or shower after the staples are removed at the first office visit following surgery.  MEDICATIONS: You may resume your regular medications. Please take the pain medication as prescribed on the medication. Do not take pain medication on an empty stomach. You have been given a prescription for a blood thinner (Lovenox or Coumadin). Please take the medication as instructed. (NOTE: After completing a 2 week course of Lovenox, take one Enteric-coated aspirin once a day. This along with elevation will help reduce the possibility of phlebitis in your operated leg.) Do not drive or drink alcoholic beverages when taking pain medications.  CALL THE OFFICE FOR: Temperature above 101 degrees Excessive bleeding or drainage on the dressing. Excessive swelling, coldness, or paleness of the toes. Persistent nausea and vomiting.  FOLLOW-UP:  You  should have an appointment to return to the office in 10-14 days after surgery. Arrangements have been made for continuation of Physical Therapy (either home therapy or outpatient therapy).   [facility] Department Directory         [email or web address]       [email or web address]          Cardiology  Appointments: [place] - [phone] [place] - [phone]  Endocrinology  Appointments: [place] - [phone] [place] - [phone]  Gastroenterology  Appointments: [place] - [phone] [place] - [phone]        General Surgery   Appointments: [place] - [phone]  Internal Medicine/Family Medicine  Appointments: [place] - [phone] [place] - [phone] [place] - [phone]  Metabolic and Weigh Loss Surgery  Appointments: [place] - [phone]        Neurology  Appointments: [place] - [phone] [place] - [phone]  Neurosurgery  Appointments: [place] - [phone]  Obstetrics & Gynecology  Appointments: [place] - [phone] [place] - [phone]        Pediatrics  Appointments: [place] - [phone] [place] - [phone]  Physiatry  Appointments: [place] -[phone]  Physical Therapy  Appointments: [place] - [phone] [place] - [phone]        Podiatry  Appointments: [place] - [phone] [place] - [phone]  Pulmonology  Appointments: [place] - [phone]  Rheumatology  Appointments: [place] - [phone]        [place] Location: [facility]  [address] , [place]  [address]  [place] Location: [facility] [address] [place], [place]  [address]  [place] Location: [facility] [address] [place], [place]  [address]    

## 2022-06-08 DIAGNOSIS — H524 Presbyopia: Secondary | ICD-10-CM | POA: Diagnosis not present

## 2022-06-10 ENCOUNTER — Encounter
Admission: RE | Admit: 2022-06-10 | Discharge: 2022-06-10 | Disposition: A | Discharge status: Home / Self Care | Payer: Medicare HMO | Source: Ambulatory Visit | Attending: Orthopedic Surgery | Admitting: Orthopedic Surgery

## 2022-06-10 VITALS — BP 167/60 | HR 63 | Resp 14 | Ht 62.0 in | Wt 177.0 lb

## 2022-06-10 DIAGNOSIS — Z01818 Encounter for other preprocedural examination: Secondary | ICD-10-CM | POA: Insufficient documentation

## 2022-06-10 DIAGNOSIS — Z01810 Encounter for preprocedural cardiovascular examination: Secondary | ICD-10-CM | POA: Diagnosis not present

## 2022-06-10 HISTORY — DX: Other intervertebral disc degeneration, lumbar region without mention of lumbar back pain or lower extremity pain: M51.369

## 2022-06-10 HISTORY — DX: Peripheral vascular disease, unspecified: I73.9

## 2022-06-10 HISTORY — DX: Tremor, unspecified: R25.1

## 2022-06-10 HISTORY — DX: Hyperglycemia, unspecified: R73.9

## 2022-06-10 HISTORY — DX: Unspecified hearing loss, unspecified ear: H91.90

## 2022-06-10 HISTORY — DX: Other intervertebral disc degeneration, lumbar region: M51.36

## 2022-06-10 HISTORY — DX: Chronic cholecystitis: K81.1

## 2022-06-10 HISTORY — DX: Palpitations: R00.2

## 2022-06-10 HISTORY — DX: Chronic kidney disease, stage 3 unspecified: N18.30

## 2022-06-10 LAB — CBC
HCT: 36.9 % (ref 36.0–46.0)
Hemoglobin: 12.3 g/dL (ref 12.0–15.0)
MCH: 28.7 pg (ref 26.0–34.0)
MCHC: 33.3 g/dL (ref 30.0–36.0)
MCV: 86 fL (ref 80.0–100.0)
Platelets: 211 10*3/uL (ref 150–400)
RBC: 4.29 MIL/uL (ref 3.87–5.11)
RDW: 12.7 % (ref 11.5–15.5)
WBC: 6 10*3/uL (ref 4.0–10.5)
nRBC: 0 % (ref 0.0–0.2)

## 2022-06-10 LAB — COMPREHENSIVE METABOLIC PANEL
ALT: 20 U/L (ref 0–44)
AST: 25 U/L (ref 15–41)
Albumin: 3.7 g/dL (ref 3.5–5.0)
Alkaline Phosphatase: 61 U/L (ref 38–126)
Anion gap: 5 (ref 5–15)
BUN: 24 mg/dL — ABNORMAL HIGH (ref 8–23)
CO2: 28 mmol/L (ref 22–32)
Calcium: 9.6 mg/dL (ref 8.9–10.3)
Chloride: 107 mmol/L (ref 98–111)
Creatinine, Ser: 1.05 mg/dL — ABNORMAL HIGH (ref 0.44–1.00)
GFR, Estimated: 53 mL/min — ABNORMAL LOW (ref >60)
Glucose, Bld: 106 mg/dL — ABNORMAL HIGH (ref 70–99)
Potassium: 3.8 mmol/L (ref 3.5–5.1)
Sodium: 140 mmol/L (ref 135–145)
Total Bilirubin: 0.6 mg/dL (ref 0.3–1.2)
Total Protein: 6.8 g/dL (ref 6.5–8.1)

## 2022-06-10 LAB — SURGICAL PCR SCREEN
MRSA, PCR: NEGATIVE
Staphylococcus aureus: POSITIVE — AB

## 2022-06-10 LAB — URINALYSIS, ROUTINE W REFLEX MICROSCOPIC
Bilirubin Urine: NEGATIVE
Glucose, UA: NEGATIVE mg/dL
Hgb urine dipstick: NEGATIVE
Ketones, ur: NEGATIVE mg/dL
Leukocytes,Ua: NEGATIVE
Nitrite: NEGATIVE
Protein, ur: NEGATIVE mg/dL
Specific Gravity, Urine: 1.006 (ref 1.005–1.030)
pH: 7 (ref 5.0–8.0)

## 2022-06-10 LAB — TYPE AND SCREEN
ABO/RH(D): O POS
Antibody Screen: NEGATIVE

## 2022-06-10 LAB — C-REACTIVE PROTEIN: CRP: 1.2 mg/dL — ABNORMAL HIGH (ref <1.0)

## 2022-06-10 LAB — SEDIMENTATION RATE: Sed Rate: 22 mm/hr (ref 0–30)

## 2022-06-10 NOTE — Patient Instructions (Addendum)
Your procedure is scheduled on:06-22-22 Wednesday Report to the Registration Desk on the 1st floor of the [facility].Then proceed to the 2nd floor Surgery Desk To find out your arrival time, please call [phone] between 1PM - 3PM on:06-21-22 Tuesday If your arrival time is 6:00 am, do not arrive prior to that time as the [facility] entrance doors do not open until 6:00 am.  REMEMBER: Instructions that are not followed completely may result in serious medical risk, up to and including death; or upon the discretion of your surgeon and anesthesiologist your surgery may need to be rescheduled.  Do not eat food after midnight the night before surgery.  No gum chewing, lozengers or hard candies.  You may however, drink CLEAR liquids up to 2 hours before you are scheduled to arrive for your surgery. Do not drink anything within 2 hours of your scheduled arrival time.  Clear liquids include: - water  - apple juice without pulp - gatorade (not RED colors) - black coffee or tea (Do NOT add milk or creamers to the coffee or tea) Do NOT drink anything that is not on this list.  In addition, your doctor has ordered for you to drink the provided  Ensure Pre-Surgery Clear Carbohydrate Drink  Drinking this carbohydrate drink up to two hours before surgery helps to reduce insulin resistance and improve patient outcomes. Please complete drinking 2 hours prior to scheduled arrival time.  TAKE THESE MEDICATIONS THE MORNING OF SURGERY WITH A SIP OF WATER: -metoprolol succinate (TOPROL-XL)  -omeprazole (PRILOSEC)-take one the night before and one on the morning of surgery - helps to prevent nausea after surgery.)  Stop your 81 mg Aspirin 7 days prior to surgery-Last dose on 06-14-22 Tuesday  One week prior to surgery: Stop Anti-inflammatories (NSAIDS) such as Advil, Aleve, Ibuprofen, Motrin, Naproxen, Naprosyn and Aspirin based products such as Excedrin, Goodys Powder, BC Powder.You may however, take  Tylenol if needed for pain up until the day of surgery.  Stop ANY OVER THE COUNTER supplements/vitamins 7 days prior to surgery (Vitamin E, B Complex, Fish Oil, Prevagen, Vitamin D3) You may continue your Melatonin up until the night prior to surgery  No Alcohol for 24 hours before or after surgery.  No Smoking including e-cigarettes for 24 hours prior to surgery.  No chewable tobacco products for at least 6 hours prior to surgery.  No nicotine patches on the day of surgery.  Do not use any "recreational" drugs for at least a week prior to your surgery.  Please be advised that the combination of cocaine and anesthesia may have negative outcomes, up to and including death. If you test positive for cocaine, your surgery will be cancelled.  On the morning of surgery brush your teeth with toothpaste and water, you may rinse your mouth with mouthwash if you wish. Do not swallow any toothpaste or mouthwash.  Use CHG Soap as directed on instruction sheet.  Do not wear jewelry, make-up, hairpins, clips or nail polish.  Do not wear lotions, powders, or perfumes.   Do not shave body from the neck down 48 hours prior to surgery just in case you cut yourself which could leave a site for infection.  Also, freshly shaved skin may become irritated if using the CHG soap.  Contact lenses, hearing aids and dentures may not be worn into surgery.  Do not bring valuables to the hospital. [facility] is not responsible for any missing/lost belongings or valuables.   Notify your doctor  if there is any change in your medical condition (cold, fever, infection).  Wear comfortable clothing (specific to your surgery type) to the hospital.  After surgery, you can help prevent lung complications by doing breathing exercises.  Take deep breaths and cough every 1-2 hours. Your doctor may order a device called an Incentive Spirometer to help you take deep breaths. When coughing or sneezing, hold a pillow firmly  against your incision with both hands. This is called "splinting." Doing this helps protect your incision. It also decreases belly discomfort.  If you are being admitted to the hospital overnight, leave your suitcase in the car. After surgery it may be brought to your room.  If you are being discharged the day of surgery, you will not be allowed to drive home. You will need a responsible adult (18 years or older) to drive you home and stay with you that night.   If you are taking public transportation, you will need to have a responsible adult (18 years or older) with you. Please confirm with your physician that it is acceptable to use public transportation.   Please call the [facility]. at [phone] if you have any questions about these instructions.  Surgery Visitation Policy:  Patients undergoing a surgery or procedure may have two family members or support persons with them as long as the person is not COVID-19 positive or experiencing its symptoms.   Inpatient Visitation:    Visiting hours are 7 a.m. to 8 p.m. Up to four visitors are allowed at one time in a patient room, including children. The visitors may rotate out with other people during the day. One designated support person (adult) may remain overnight.      How to Use an Incentive Spirometer An incentive spirometer is a tool that measures how well you are filling your lungs with each breath. Learning to take long, deep breaths using this tool can help you keep your lungs clear and active. This may help to reverse or lessen your chance of developing breathing (pulmonary) problems, especially infection. You may be asked to use a spirometer: After a surgery. If you have a lung problem or a history of smoking. After a long period of time when you have been unable to move or be active. If the spirometer includes an indicator to show the highest number that you have reached, your health care provider or  respiratory therapist will help you set a goal. Keep a log of your progress as told by your health care provider. What are the risks? Breathing too quickly may cause dizziness or cause you to pass out. Take your time so you do not get dizzy or light-headed. If you are in pain, you may need to take pain medicine before doing incentive spirometry. It is harder to take a deep breath if you are having pain. How to use your incentive spirometer  Sit up on the edge of your bed or on a chair. Hold the incentive spirometer so that it is in an upright position. Before you use the spirometer, breathe out normally. Place the mouthpiece in your mouth. Make sure your lips are closed tightly around it. Breathe in slowly and as deeply as you can through your mouth, causing the piston or the ball to rise toward the top of the chamber. Hold your breath for 3-5 seconds, or for as long as possible. If the spirometer includes a coach indicator, use this to guide you in breathing. Slow down your  breathing if the indicator goes above the marked areas. Remove the mouthpiece from your mouth and breathe out normally. The piston or ball will return to the bottom of the chamber. Rest for a few seconds, then repeat the steps 10 or more times. Take your time and take a few normal breaths between deep breaths so that you do not get dizzy or light-headed. Do this every 1-2 hours when you are awake. If the spirometer includes a goal marker to show the highest number you have reached (best effort), use this as a goal to work toward during each repetition. After each set of 10 deep breaths, cough a few times. This will help to make sure that your lungs are clear. If you have an incision on your chest or abdomen from surgery, place a pillow or a rolled-up towel firmly against the incision when you cough. This can help to reduce pain while taking deep breaths and coughing. General tips When you are able to get out of bed: Walk  around often. Continue to take deep breaths and cough in order to clear your lungs. Keep using the incentive spirometer until your health care provider says it is okay to stop using it. If you have been in the hospital, you may be told to keep using the spirometer at home. Contact a health care provider if: You are having difficulty using the spirometer. You have trouble using the spirometer as often as instructed. Your pain medicine is not giving enough relief for you to use the spirometer as told. You have a fever. Get help right away if: You develop shortness of breath. You develop a cough with bloody mucus from the lungs. You have fluid or blood coming from an incision site after you cough. Summary An incentive spirometer is a tool that can help you learn to take long, deep breaths to keep your lungs clear and active. You may be asked to use a spirometer after a surgery, if you have a lung problem or a history of smoking, or if you have been inactive for a long period of time. Use your incentive spirometer as instructed every 1-2 hours while you are awake. If you have an incision on your chest or abdomen, place a pillow or a rolled-up towel firmly against your incision when you cough. This will help to reduce pain. Get help right away if you have shortness of breath, you cough up bloody mucus, or blood comes from your incision when you cough. This information is not intended to replace advice given to you by your health care provider. Make sure you discuss any questions you have with your health care provider. Document Revised: 12/23/2019 Document Reviewed: 12/23/2019 Elsevier Patient Education  [place].

## 2022-06-16 DIAGNOSIS — M1711 Unilateral primary osteoarthritis, right knee: Secondary | ICD-10-CM | POA: Diagnosis not present

## 2022-06-22 ENCOUNTER — Ambulatory Visit: Payer: Medicare HMO | Admitting: General Practice

## 2022-06-22 ENCOUNTER — Observation Stay: Payer: Medicare HMO

## 2022-06-22 ENCOUNTER — Encounter: Payer: Self-pay | Admitting: Orthopedic Surgery

## 2022-06-22 ENCOUNTER — Ambulatory Visit: Payer: Medicare HMO | Admitting: Urgent Care

## 2022-06-22 ENCOUNTER — Observation Stay
Admission: RE | Admit: 2022-06-22 | Discharge: 2022-06-23 | Disposition: A | Discharge status: Home Health | Payer: Medicare HMO | Attending: Orthopedic Surgery | Admitting: Orthopedic Surgery

## 2022-06-22 ENCOUNTER — Encounter
Admission: RE | Disposition: A | Discharge status: Home Health | Payer: Self-pay | Source: Home / Self Care | Attending: Orthopedic Surgery

## 2022-06-22 ENCOUNTER — Other Ambulatory Visit: Payer: Self-pay

## 2022-06-22 DIAGNOSIS — N1831 Chronic kidney disease, stage 3a: Secondary | ICD-10-CM | POA: Diagnosis not present

## 2022-06-22 DIAGNOSIS — Z96642 Presence of left artificial hip joint: Secondary | ICD-10-CM | POA: Insufficient documentation

## 2022-06-22 DIAGNOSIS — Z7982 Long term (current) use of aspirin: Secondary | ICD-10-CM | POA: Diagnosis not present

## 2022-06-22 DIAGNOSIS — Z91040 Latex allergy status: Secondary | ICD-10-CM | POA: Insufficient documentation

## 2022-06-22 DIAGNOSIS — G459 Transient cerebral ischemic attack, unspecified: Secondary | ICD-10-CM

## 2022-06-22 DIAGNOSIS — M1711 Unilateral primary osteoarthritis, right knee: Principal | ICD-10-CM | POA: Insufficient documentation

## 2022-06-22 DIAGNOSIS — Z79899 Other long term (current) drug therapy: Secondary | ICD-10-CM | POA: Insufficient documentation

## 2022-06-22 DIAGNOSIS — E785 Hyperlipidemia, unspecified: Secondary | ICD-10-CM | POA: Diagnosis not present

## 2022-06-22 DIAGNOSIS — Z96659 Presence of unspecified artificial knee joint: Secondary | ICD-10-CM

## 2022-06-22 DIAGNOSIS — I129 Hypertensive chronic kidney disease with stage 1 through stage 4 chronic kidney disease, or unspecified chronic kidney disease: Secondary | ICD-10-CM | POA: Diagnosis not present

## 2022-06-22 DIAGNOSIS — Z8673 Personal history of transient ischemic attack (TIA), and cerebral infarction without residual deficits: Secondary | ICD-10-CM | POA: Diagnosis not present

## 2022-06-22 DIAGNOSIS — Z96651 Presence of right artificial knee joint: Secondary | ICD-10-CM | POA: Diagnosis not present

## 2022-06-22 DIAGNOSIS — Z471 Aftercare following joint replacement surgery: Secondary | ICD-10-CM | POA: Diagnosis not present

## 2022-06-22 DIAGNOSIS — R739 Hyperglycemia, unspecified: Secondary | ICD-10-CM

## 2022-06-22 DIAGNOSIS — I739 Peripheral vascular disease, unspecified: Secondary | ICD-10-CM

## 2022-06-22 HISTORY — PX: KNEE ARTHROPLASTY: SHX992

## 2022-06-22 SURGERY — ARTHROPLASTY, KNEE, TOTAL, USING IMAGELESS COMPUTER-ASSISTED NAVIGATION
Anesthesia: Choice | Site: Knee | Laterality: Right

## 2022-06-22 MED ORDER — OXYCODONE HCL 5 MG PO TABS: 5.0000 mg | ORAL_TABLET | ORAL | Status: DC | PRN

## 2022-06-22 MED ORDER — OXYCODONE HCL 5 MG/5ML PO SOLN: 5.0000 mg | Freq: Once | ORAL | Status: DC | PRN

## 2022-06-22 MED ORDER — PROPOFOL 10 MG/ML IV BOLUS
INTRAVENOUS | Status: DC | PRN
  Administered 2022-06-22: 10 mg via INTRAVENOUS
  Administered 2022-06-22: 150 mg via INTRAVENOUS
  Administered 2022-06-22: 10 mg via INTRAVENOUS
  Administered 2022-06-22: 20 mg via INTRAVENOUS

## 2022-06-22 MED ORDER — CEFAZOLIN SODIUM-DEXTROSE 2-4 GM/100ML-% IV SOLN
2.0000 g | INTRAVENOUS | Status: AC
  Administered 2022-06-22: 2 g via INTRAVENOUS

## 2022-06-22 MED ORDER — VITAMIN D 25 MCG (1000 UNIT) PO TABS
1000.0000 [IU] | ORAL_TABLET | Freq: Two times a day (BID) | ORAL | Status: DC
Start: 2022-06-22 — End: 2022-06-23
  Administered 2022-06-22 – 2022-06-23 (×2): 1000 [IU] via ORAL
  Filled 2022-06-22 (×2): qty 1

## 2022-06-22 MED ORDER — FENTANYL CITRATE (PF) 100 MCG/2ML IJ SOLN
INTRAMUSCULAR | Status: AC
  Filled 2022-06-22: qty 2

## 2022-06-22 MED ORDER — FLEET ENEMA 7-19 GM/118ML RE ENEM: 1.0000 | ENEMA | Freq: Once | RECTAL | Status: DC | PRN

## 2022-06-22 MED ORDER — ONDANSETRON HCL 4 MG PO TABS: 4.0000 mg | ORAL_TABLET | Freq: Four times a day (QID) | ORAL | Status: DC | PRN

## 2022-06-22 MED ORDER — ORAL CARE MOUTH RINSE: 15.0000 mL | Freq: Once | OROMUCOSAL | Status: AC

## 2022-06-22 MED ORDER — FENTANYL CITRATE (PF) 100 MCG/2ML IJ SOLN
INTRAMUSCULAR | Status: AC
  Administered 2022-06-22: 25 ug via INTRAVENOUS
  Filled 2022-06-22: qty 2

## 2022-06-22 MED ORDER — ACETAMINOPHEN 10 MG/ML IV SOLN
1000.0000 mg | Freq: Four times a day (QID) | INTRAVENOUS | Status: DC
  Administered 2022-06-23 (×2): 1000 mg via INTRAVENOUS
  Filled 2022-06-22 (×2): qty 100

## 2022-06-22 MED ORDER — ONDANSETRON HCL 4 MG/2ML IJ SOLN: 4.0000 mg | Freq: Four times a day (QID) | INTRAMUSCULAR | Status: DC | PRN

## 2022-06-22 MED ORDER — PANTOPRAZOLE SODIUM 40 MG PO TBEC
40.0000 mg | DELAYED_RELEASE_TABLET | Freq: Two times a day (BID) | ORAL | Status: DC
  Administered 2022-06-22 – 2022-06-23 (×2): 40 mg via ORAL
  Filled 2022-06-22 (×2): qty 1

## 2022-06-22 MED ORDER — CEFAZOLIN SODIUM-DEXTROSE 2-4 GM/100ML-% IV SOLN
INTRAVENOUS | Status: AC
  Filled 2022-06-22: qty 100

## 2022-06-22 MED ORDER — FERROUS SULFATE 325 (65 FE) MG PO TABS
325.0000 mg | ORAL_TABLET | Freq: Two times a day (BID) | ORAL | Status: DC
  Administered 2022-06-23: 325 mg via ORAL
  Filled 2022-06-22: qty 1

## 2022-06-22 MED ORDER — SENNOSIDES-DOCUSATE SODIUM 8.6-50 MG PO TABS
1.0000 | ORAL_TABLET | Freq: Two times a day (BID) | ORAL | Status: DC
  Administered 2022-06-22 – 2022-06-23 (×2): 1 via ORAL
  Filled 2022-06-22 (×2): qty 1

## 2022-06-22 MED ORDER — LIDOCAINE HCL (CARDIAC) PF 100 MG/5ML IV SOSY
PREFILLED_SYRINGE | INTRAVENOUS | Status: DC | PRN
  Administered 2022-06-22: 80 mg via INTRAVENOUS

## 2022-06-22 MED ORDER — PHENYLEPHRINE HCL-NACL 20-0.9 MG/250ML-% IV SOLN
INTRAVENOUS | Status: DC | PRN
  Administered 2022-06-22: 30 ug/min via INTRAVENOUS

## 2022-06-22 MED ORDER — PROPOFOL 1000 MG/100ML IV EMUL
INTRAVENOUS | Status: AC
  Filled 2022-06-22: qty 100

## 2022-06-22 MED ORDER — CHLORHEXIDINE GLUCONATE 0.12 % MT SOLN
15.0000 mL | Freq: Once | OROMUCOSAL | Status: AC
Start: 2022-06-22 — End: 2022-06-22

## 2022-06-22 MED ORDER — CHLORHEXIDINE GLUCONATE 4 % EX LIQD: 60.0000 mL | Freq: Once | CUTANEOUS | Status: DC

## 2022-06-22 MED ORDER — MENTHOL 3 MG MT LOZG
1.0000 | LOZENGE | OROMUCOSAL | Status: DC | PRN
Start: 2022-06-22 — End: 2022-06-23

## 2022-06-22 MED ORDER — OXYCODONE HCL 5 MG PO TABS: 10.0000 mg | ORAL_TABLET | ORAL | Status: DC | PRN

## 2022-06-22 MED ORDER — CELECOXIB 200 MG PO CAPS
ORAL_CAPSULE | ORAL | Status: AC
  Administered 2022-06-22: 400 mg via ORAL
  Filled 2022-06-22: qty 2

## 2022-06-22 MED ORDER — CEFAZOLIN SODIUM-DEXTROSE 2-4 GM/100ML-% IV SOLN
2.0000 g | Freq: Four times a day (QID) | INTRAVENOUS | Status: AC
  Administered 2022-06-22 – 2022-06-23 (×2): 2 g via INTRAVENOUS
  Filled 2022-06-22 (×2): qty 100

## 2022-06-22 MED ORDER — HYDROMORPHONE HCL 1 MG/ML IJ SOLN: 0.5000 mg | INTRAMUSCULAR | Status: DC | PRN

## 2022-06-22 MED ORDER — LACTATED RINGERS IV SOLN: INTRAVENOUS | Status: DC | PRN

## 2022-06-22 MED ORDER — TRAMADOL HCL 50 MG PO TABS: 50.0000 mg | ORAL_TABLET | ORAL | Status: DC | PRN

## 2022-06-22 MED ORDER — FENTANYL CITRATE (PF) 100 MCG/2ML IJ SOLN: 25.0000 ug | INTRAMUSCULAR | Status: DC | PRN

## 2022-06-22 MED ORDER — CHLORHEXIDINE GLUCONATE 0.12 % MT SOLN
OROMUCOSAL | Status: AC
  Administered 2022-06-22: 15 mL via OROMUCOSAL
  Filled 2022-06-22: qty 15

## 2022-06-22 MED ORDER — GABAPENTIN 300 MG PO CAPS: 300.0000 mg | ORAL_CAPSULE | Freq: Once | ORAL | Status: AC

## 2022-06-22 MED ORDER — SODIUM CHLORIDE (PF) 0.9 % IJ SOLN: INTRAMUSCULAR | Status: DC | PRN

## 2022-06-22 MED ORDER — LACTATED RINGERS IV BOLUS
500.0000 mL | Freq: Once | INTRAVENOUS | Status: AC
  Administered 2022-06-22: 500 mL via INTRAVENOUS

## 2022-06-22 MED ORDER — DIPHENHYDRAMINE HCL 12.5 MG/5ML PO ELIX: 12.5000 mg | ORAL_SOLUTION | ORAL | Status: DC | PRN

## 2022-06-22 MED ORDER — FENTANYL CITRATE (PF) 100 MCG/2ML IJ SOLN
INTRAMUSCULAR | Status: DC | PRN
Start: 2022-06-22 — End: 2022-06-22
  Administered 2022-06-22 (×4): 50 ug via INTRAVENOUS

## 2022-06-22 MED ORDER — METOPROLOL SUCCINATE ER 50 MG PO TB24
100.0000 mg | ORAL_TABLET | ORAL | Status: DC
  Administered 2022-06-23: 100 mg via ORAL
  Filled 2022-06-22: qty 2

## 2022-06-22 MED ORDER — BUPIVACAINE HCL (PF) 0.25 % IJ SOLN
INTRAMUSCULAR | Status: AC
  Filled 2022-06-22: qty 60

## 2022-06-22 MED ORDER — CELECOXIB 200 MG PO CAPS: 400.0000 mg | ORAL_CAPSULE | Freq: Once | ORAL | Status: AC

## 2022-06-22 MED ORDER — DEXAMETHASONE SODIUM PHOSPHATE 10 MG/ML IJ SOLN
INTRAMUSCULAR | Status: AC
  Administered 2022-06-22: 8 mg via INTRAVENOUS
  Filled 2022-06-22: qty 1

## 2022-06-22 MED ORDER — GABAPENTIN 300 MG PO CAPS
ORAL_CAPSULE | ORAL | Status: AC
  Administered 2022-06-22: 300 mg via ORAL
  Filled 2022-06-22: qty 1

## 2022-06-22 MED ORDER — BUPIVACAINE LIPOSOME 1.3 % IJ SUSP
INTRAMUSCULAR | Status: AC
  Filled 2022-06-22: qty 20

## 2022-06-22 MED ORDER — METOCLOPRAMIDE HCL 5 MG PO TABS
10.0000 mg | ORAL_TABLET | Freq: Three times a day (TID) | ORAL | Status: DC
  Administered 2022-06-22 – 2022-06-23 (×3): 10 mg via ORAL
  Filled 2022-06-22 (×3): qty 2

## 2022-06-22 MED ORDER — TRANEXAMIC ACID-NACL 1000-0.7 MG/100ML-% IV SOLN
INTRAVENOUS | Status: AC
  Administered 2022-06-22: 1000 mg via INTRAVENOUS
  Filled 2022-06-22: qty 100

## 2022-06-22 MED ORDER — OMEGA-3-ACID ETHYL ESTERS 1 G PO CAPS
1.0000 g | ORAL_CAPSULE | Freq: Every day | ORAL | Status: DC
Start: 2022-06-23 — End: 2022-06-23
  Administered 2022-06-23: 1 g via ORAL
  Filled 2022-06-22: qty 1

## 2022-06-22 MED ORDER — ALUM & MAG HYDROXIDE-SIMETH 200-200-20 MG/5ML PO SUSP: 30.0000 mL | ORAL | Status: DC | PRN

## 2022-06-22 MED ORDER — PHENOL 1.4 % MT LIQD: 1.0000 | OROMUCOSAL | Status: DC | PRN

## 2022-06-22 MED ORDER — TRANEXAMIC ACID-NACL 1000-0.7 MG/100ML-% IV SOLN
1000.0000 mg | INTRAVENOUS | Status: AC
  Administered 2022-06-22: 1000 mg via INTRAVENOUS

## 2022-06-22 MED ORDER — ONDANSETRON HCL 4 MG/2ML IJ SOLN: 4.0000 mg | Freq: Once | INTRAMUSCULAR | Status: AC

## 2022-06-22 MED ORDER — SODIUM CHLORIDE FLUSH 0.9 % IV SOLN
INTRAVENOUS | Status: AC
  Filled 2022-06-22: qty 40

## 2022-06-22 MED ORDER — MAGNESIUM HYDROXIDE 400 MG/5ML PO SUSP
30.0000 mL | Freq: Every day | ORAL | Status: DC
  Administered 2022-06-23: 30 mL via ORAL
  Filled 2022-06-22: qty 30

## 2022-06-22 MED ORDER — VITAMIN E 45 MG (100 UNIT) PO CAPS
400.0000 [IU] | ORAL_CAPSULE | Freq: Every day | ORAL | Status: DC
  Administered 2022-06-23: 400 [IU] via ORAL
  Filled 2022-06-22: qty 4

## 2022-06-22 MED ORDER — ACETAMINOPHEN 10 MG/ML IV SOLN
INTRAVENOUS | Status: AC
  Filled 2022-06-22: qty 100

## 2022-06-22 MED ORDER — SODIUM CHLORIDE 0.9 % IR SOLN
Status: DC | PRN
  Administered 2022-06-22: 500 mL

## 2022-06-22 MED ORDER — SODIUM CHLORIDE 0.9 % IV SOLN: INTRAVENOUS | Status: DC

## 2022-06-22 MED ORDER — LOSARTAN POTASSIUM 50 MG PO TABS
100.0000 mg | ORAL_TABLET | ORAL | Status: DC
  Administered 2022-06-23: 100 mg via ORAL
  Filled 2022-06-22: qty 2

## 2022-06-22 MED ORDER — TRANEXAMIC ACID-NACL 1000-0.7 MG/100ML-% IV SOLN
INTRAVENOUS | Status: AC
  Filled 2022-06-22: qty 100

## 2022-06-22 MED ORDER — TRANEXAMIC ACID-NACL 1000-0.7 MG/100ML-% IV SOLN: 1000.0000 mg | Freq: Once | INTRAVENOUS | Status: AC

## 2022-06-22 MED ORDER — ENOXAPARIN SODIUM 30 MG/0.3ML IJ SOSY
30.0000 mg | PREFILLED_SYRINGE | Freq: Two times a day (BID) | INTRAMUSCULAR | Status: DC
  Administered 2022-06-23: 30 mg via SUBCUTANEOUS
  Filled 2022-06-22: qty 0.3

## 2022-06-22 MED ORDER — ROCURONIUM BROMIDE 100 MG/10ML IV SOLN
INTRAVENOUS | Status: DC | PRN
  Administered 2022-06-22: 60 mg via INTRAVENOUS

## 2022-06-22 MED ORDER — ONDANSETRON HCL 4 MG/2ML IJ SOLN
INTRAMUSCULAR | Status: DC | PRN
  Administered 2022-06-22: 4 mg via INTRAVENOUS

## 2022-06-22 MED ORDER — OXYCODONE HCL 5 MG PO TABS: 5.0000 mg | ORAL_TABLET | Freq: Once | ORAL | Status: DC | PRN

## 2022-06-22 MED ORDER — SURGIPHOR WOUND IRRIGATION SYSTEM - OPTIME
TOPICAL | Status: DC | PRN
  Administered 2022-06-22: 1 via TOPICAL

## 2022-06-22 MED ORDER — CELECOXIB 200 MG PO CAPS
200.0000 mg | ORAL_CAPSULE | Freq: Two times a day (BID) | ORAL | Status: DC
  Administered 2022-06-22 – 2022-06-23 (×2): 200 mg via ORAL
  Filled 2022-06-22 (×2): qty 1

## 2022-06-22 MED ORDER — DEXAMETHASONE SODIUM PHOSPHATE 10 MG/ML IJ SOLN: 8.0000 mg | Freq: Once | INTRAMUSCULAR | Status: AC

## 2022-06-22 MED ORDER — ONDANSETRON HCL 4 MG/2ML IJ SOLN
INTRAMUSCULAR | Status: AC
  Administered 2022-06-22: 4 mg via INTRAVENOUS
  Filled 2022-06-22: qty 2

## 2022-06-22 MED ORDER — DROPERIDOL 2.5 MG/ML IJ SOLN
INTRAMUSCULAR | Status: AC
  Administered 2022-06-22: 0.625 mg via INTRAVENOUS
  Filled 2022-06-22: qty 2

## 2022-06-22 MED ORDER — ACETAMINOPHEN 10 MG/ML IV SOLN
INTRAVENOUS | Status: DC | PRN
  Administered 2022-06-22: 1000 mg via INTRAVENOUS

## 2022-06-22 MED ORDER — SUGAMMADEX SODIUM 200 MG/2ML IV SOLN
INTRAVENOUS | Status: DC | PRN
  Administered 2022-06-22: 200 mg via INTRAVENOUS

## 2022-06-22 MED ORDER — ACETAMINOPHEN 325 MG PO TABS: 325.0000 mg | ORAL_TABLET | Freq: Four times a day (QID) | ORAL | Status: DC | PRN

## 2022-06-22 MED ORDER — ENSURE PRE-SURGERY PO LIQD: 296.0000 mL | Freq: Once | ORAL | Status: DC

## 2022-06-22 MED ORDER — BISACODYL 10 MG RE SUPP: 10.0000 mg | Freq: Every day | RECTAL | Status: DC | PRN

## 2022-06-22 MED ORDER — SODIUM CHLORIDE (PF) 0.9 % IJ SOLN
INTRAMUSCULAR | Status: DC | PRN
  Administered 2022-06-22: 120 mL

## 2022-06-22 MED ORDER — PHENYLEPHRINE 80 MCG/ML (10ML) SYRINGE FOR IV PUSH (FOR BLOOD PRESSURE SUPPORT)
PREFILLED_SYRINGE | INTRAVENOUS | Status: DC | PRN
  Administered 2022-06-22 (×2): 80 ug via INTRAVENOUS

## 2022-06-22 MED ORDER — DROPERIDOL 2.5 MG/ML IJ SOLN
0.6250 mg | Freq: Once | INTRAMUSCULAR | Status: AC
Start: 2022-06-22 — End: 2022-06-22

## 2022-06-22 SURGICAL SUPPLY — 66 items
ATTUNE PS FEM RT SZ 4 CEM KNEE (Femur) IMPLANT
ATTUNE PSRP INSR SZ4 8 KNEE (Insert) IMPLANT
BASEPLATE TIBIAL ROTATING SZ 4 (Knees) IMPLANT
BATTERY INSTRU NAVIGATION (MISCELLANEOUS) ×4 IMPLANT
BLADE SAW 70X12.5 (BLADE) ×1 IMPLANT
BLADE SAW 90X13X1.19 OSCILLAT (BLADE) ×1 IMPLANT
BLADE SAW 90X25X1.19 OSCILLAT (BLADE) ×1 IMPLANT
CEMENT HV SMART SET (Cement) IMPLANT
COOLER POLAR GLACIER W/PUMP (MISCELLANEOUS) ×1 IMPLANT
DRAPE 3/4 80X56 (DRAPES) ×1 IMPLANT
DRAPE INCISE IOBAN 66X45 STRL (DRAPES) IMPLANT
DRSG DERMACEA NONADH 3X8 (GAUZE/BANDAGES/DRESSINGS) ×1 IMPLANT
DRSG MEPILEX SACRM 8.7X9.8 (GAUZE/BANDAGES/DRESSINGS) ×1 IMPLANT
DRSG OPSITE POSTOP 4X14 (GAUZE/BANDAGES/DRESSINGS) ×1 IMPLANT
DRSG TEGADERM 4X4.75 (GAUZE/BANDAGES/DRESSINGS) ×1 IMPLANT
DURAPREP 26ML APPLICATOR (WOUND CARE) ×2 IMPLANT
ELECT CAUTERY BLADE 6.4 (BLADE) ×1 IMPLANT
ELECT REM PT RETURN 9FT ADLT (ELECTROSURGICAL) ×1
ELECTRODE REM PT RTRN 9FT ADLT (ELECTROSURGICAL) ×1 IMPLANT
EX-PIN ORTHOLOCK NAV 4X150 (PIN) ×2 IMPLANT
GLOVE BIO SURGEON STRL SZ7 (GLOVE) ×2 IMPLANT
GLOVE BIOGEL M STRL SZ7.5 (GLOVE) ×2 IMPLANT
GLOVE BIOGEL PI IND STRL 8 (GLOVE) ×1 IMPLANT
GLOVE PI ORTHO PRO STRL 7.5 (GLOVE) ×2 IMPLANT
GLOVE SURG UNDER POLY LF SZ7.5 (GLOVE) ×1 IMPLANT
GOWN STRL REUS W/ TWL LRG LVL3 (GOWN DISPOSABLE) ×2 IMPLANT
GOWN STRL REUS W/ TWL XL LVL3 (GOWN DISPOSABLE) ×1 IMPLANT
GOWN STRL REUS W/TWL LRG LVL3 (GOWN DISPOSABLE) ×2
GOWN STRL REUS W/TWL XL LVL3 (GOWN DISPOSABLE) ×1
HEMOVAC 400CC 10FR (MISCELLANEOUS) ×1 IMPLANT
HOLDER FOLEY CATH W/STRAP (MISCELLANEOUS) ×1 IMPLANT
HOLSTER ELECTROSUGICAL PENCIL (MISCELLANEOUS) ×1 IMPLANT
HOOD PEEL AWAY FLYTE STAYCOOL (MISCELLANEOUS) ×2 IMPLANT
IV NS IRRIG 3000ML ARTHROMATIC (IV SOLUTION) ×1 IMPLANT
KIT TURNOVER KIT A (KITS) ×1 IMPLANT
KNIFE SCULPS 14X20 (INSTRUMENTS) ×1 IMPLANT
MANIFOLD NEPTUNE II (INSTRUMENTS) ×2 IMPLANT
NDL SPNL 20GX3.5 QUINCKE YW (NEEDLE) ×2 IMPLANT
NEEDLE SPNL 20GX3.5 QUINCKE YW (NEEDLE) ×2 IMPLANT
NS IRRIG 500ML POUR BTL (IV SOLUTION) ×1 IMPLANT
PACK TOTAL KNEE (MISCELLANEOUS) ×1 IMPLANT
PAD ABD DERMACEA PRESS 5X9 (GAUZE/BANDAGES/DRESSINGS) ×2 IMPLANT
PAD WRAPON POLAR KNEE (MISCELLANEOUS) ×1 IMPLANT
PATELLA MEDIAL ATTUN 35MM KNEE (Knees) IMPLANT
PIN DRILL FIX HALF THREAD (BIT) ×2 IMPLANT
PIN FIXATION 1/8DIA X 3INL (PIN) ×1 IMPLANT
PULSAVAC PLUS IRRIG FAN TIP (DISPOSABLE) ×1
SOL PREP PVP 2OZ (MISCELLANEOUS) ×1
SOLUTION IRRIG SURGIPHOR (IV SOLUTION) ×1 IMPLANT
SOLUTION PREP PVP 2OZ (MISCELLANEOUS) ×1 IMPLANT
SPONGE DRAIN TRACH 4X4 STRL 2S (GAUZE/BANDAGES/DRESSINGS) ×1 IMPLANT
STAPLER SKIN PROX 35W (STAPLE) ×1 IMPLANT
STRAP TIBIA SHORT (MISCELLANEOUS) ×1 IMPLANT
SUCTION FRAZIER HANDLE 10FR (MISCELLANEOUS) ×1
SUCTION TUBE FRAZIER 10FR DISP (MISCELLANEOUS) ×1 IMPLANT
SUT VIC AB 0 CT1 36 (SUTURE) ×2 IMPLANT
SUT VIC AB 1 CT1 36 (SUTURE) ×2 IMPLANT
SUT VIC AB 2-0 CT2 27 (SUTURE) ×1 IMPLANT
SYR 30ML LL (SYRINGE) ×2 IMPLANT
TIP FAN IRRIG PULSAVAC PLUS (DISPOSABLE) ×1 IMPLANT
TOWEL OR 17X26 4PK STRL BLUE (TOWEL DISPOSABLE) IMPLANT
TOWER CARTRIDGE SMART MIX (DISPOSABLE) ×1 IMPLANT
TRAP FLUID SMOKE EVACUATOR (MISCELLANEOUS) ×1 IMPLANT
TRAY FOLEY MTR SLVR 16FR STAT (SET/KITS/TRAYS/PACK) ×1 IMPLANT
WATER STERILE IRR 500ML POUR (IV SOLUTION) ×1 IMPLANT
WRAPON POLAR PAD KNEE (MISCELLANEOUS) ×1

## 2022-06-22 NOTE — Interval H&P Note (Signed)
History and Physical Interval Note:  06/22/2022 1:07 PM  Dawn Vega  has presented today for surgery, with the diagnosis of Primary osteoarthritis of right knee..  The various methods of treatment have been discussed with the patient and family. After consideration of risks, benefits and other options for treatment, the patient has consented to  Procedure(s): COMPUTER ASSISTED TOTAL KNEE ARTHROPLASTY (Right) as a surgical intervention.  The patient's history has been reviewed, patient examined, no change in status, stable for surgery.  I have reviewed the patient's chart and labs.  Questions were answered to the patient's satisfaction.     [place]

## 2022-06-22 NOTE — Transfer of Care (Signed)
Immediate Anesthesia Transfer of Care Note  Patient: Dawn Vega  Procedure(s) Performed: COMPUTER ASSISTED TOTAL KNEE ARTHROPLASTY (Right: Knee)  Patient Location: PACU  Anesthesia Type:General  Level of Consciousness: sedated  Airway & Oxygen Therapy: Patient Spontanous Breathing and Patient connected to face mask oxygen  Post-op Assessment: Report given to RN and Post -op Vital signs reviewed and stable  Post vital signs: Reviewed and stable  Last Vitals:  Vitals Value Taken Time  BP    Temp    Pulse 70 06/22/22 1721  Resp 15 06/22/22 1721  SpO2 100 % 06/22/22 1721  Vitals shown include unvalidated device data.  Last Pain:  Vitals:   06/22/22 1131  TempSrc: Temporal  PainSc: 0-No pain         Complications:  Encounter Notable Events  Notable Event Outcome Phase Comment  Difficult to intubate - unexpected  Intraprocedure Filed from anesthesia note documentation.

## 2022-06-22 NOTE — H&P (Signed)
ORTHOPAEDIC HISTORY & PHYSICAL Gwenlyn Fudge, [place] - 06/16/2022 11:00 AM EDT Formatting of this note is different from the original. [place] Chief Complaint:   Chief Complaint  Patient presents with  Knee Pain  H & P RIGHT KNEE   History of Present Illness:   Dawn Vega is a 81 y.o. female that presents to clinic today for her preoperative history and evaluation. Patient presents with her son. The patient is scheduled to undergo a right total knee arthroplasty on 06/22/22 by Dr. Marry Guan. Her pain began many years ago. The pain is located primarily along the medial aspect of the knee. She describes her pain as worse with weightbearing. She reports associated swelling with some giving way of the knee. She denies associated numbness or tingling, denies locking.   The patient's symptoms have progressed to the point that they decrease her quality of life. The patient has previously undergone conservative treatment including NSAIDS and injections to the knee without adequate control of her symptoms.  Denies history of DVT, significant cardiac history, or lumbar surgery. Her son will be available to help postoperatively.  Patient does report local irritation to the skin with prolonged exposure to latex.  Past Medical, Surgical, Family, Social History, Allergies, Medications:   Past Medical History:  Past Medical History:  Diagnosis Date  GERD (gastroesophageal reflux disease)  ultrasound on 7/14 unremarkable. HIDA with CCK shows reduced gallbladder ejection fraction at 19%  Hearing loss 04/30/2014  Hemorrhoids  Hyperlipidemia  Hypertension  Osteoarthrosis  Otosclerosis  with a prior left ear surgery  Ovarian cyst  Personal history of kidney stones  Plantar fasciitis  Tremor 04/30/2014   Past Surgical History:  Past Surgical History:  Procedure Laterality Date  LAPAROSCOPIC CHOLECYSTECTOMY 2015  by Dr. Tamala Julian at [facility]  regional  Left total hip arthroplasty 01/01/2020  Dr Marry Guan  APPENDECTOMY  Bunionectomy  CHOLECYSTECTOMY  5/15 by Dr. Tamala Julian  DILATION AND CURETTAGE OF UTERUS  ear surgery Left  OOPHORECTOMY Left  Due to ovarian cyst left ovary removed at this time appendectomy as well.  Wisdom teeth   Current Medications:  Current Outpatient Medications  Medication Sig Dispense Refill  acetaminophen (TYLENOL) 500 MG tablet Take 500 mg by mouth once daily as needed for Pain  apoaequorin (PREVAGEN) capsule Take 1 capsule by mouth once daily 10 MG CAPSULES  aspirin 81 MG EC tablet Take 81 mg by mouth once daily  chlorpheniramine (CHLOR-TRIMETON) 4 mg tablet Take 4 mg by mouth 2 (two) times daily as needed FOR ALLERGIES  cholecalciferol (VITAMIN D3) 1000 unit tablet Take 1,000 Units by mouth 2 (two) times daily  docosahexanoic acid/epa (FISH OIL ORAL) Take 2,000 mg by mouth once daily  losartan (COZAAR) 100 MG tablet TAKE 1 TABLET EVERY DAY 90 tablet 1  melatonin-pyridoxine, vit B6, 5-1 mg Tab Take 5 mg by mouth at bedtime  metoprolol succinate (TOPROL-XL) 100 MG XL tablet Take 1 tablet (100 mg total) by mouth once daily 90 tablet 4  multivitamin with B complex-vitamin C (FARBEE WITH C) tablet Take 1 tablet by mouth once daily  omeprazole (PRILOSEC) 20 MG DR capsule Take 1 capsule (20 mg total) by mouth once daily 90 capsule 4  vitamin E 400 UNIT capsule Take 400 Units by mouth once daily.   No current facility-administered medications for this visit.   Allergies:  Allergies  Allergen Reactions  Ace Inhibitors Cough  Adhesive Tape-Silicones Rash  Amlodipine Swelling  Constipation  Calcium  Other (See Comments)  intolerance  Crestor [Rosuvastatin] Muscle Pain  Hydrochlorothiazide Muscle Pain  Bilateral hand tingling  Latex Dermatitis  Lipitor [Atorvastatin] Muscle Pain  Pravastatin Muscle Pain   Social History:  Social History   Socioeconomic History  Marital status: Married  Spouse  name: Hollice Espy  Number of children: 1  Years of education: 16  Highest education level: Bachelor's degree (e.g., BA, AB, BS)  Occupational History  Occupation: Retired- Materials engineer  Tobacco Use  Smoking status: Never  Smokeless tobacco: Never  Vaping Use  Vaping Use: Never used  Substance and Sexual Activity  Alcohol use: No  Alcohol/week: 0.0 standard drinks  Drug use: No  Sexual activity: Defer  Partners: Male   Family History:  Family History  Problem Relation Age of Onset  Myocardial Infarction (Heart attack) Mother  High blood pressure (Hypertension) Mother  Dementia Mother  Prostate cancer Father  Mental illness Father  Lung cancer Father  Colon polyps Sister  Myocardial Infarction (Heart attack) Other  High blood pressure (Hypertension) Other  Breast cancer Other  Colon cancer Other   Review of Systems:   A 10+ ROS was performed, reviewed, and the pertinent orthopaedic findings are documented in the HPI.   Physical Examination:   BP (!) 150/100 (BP Location: Left upper arm, Patient Position: Sitting, BP Cuff Size: Large Adult)  Ht 157.5 cm ('5\' 2"'$ )  Wt 80.4 kg (177 lb 3.2 oz)  BMI 32.41 kg/m   Patient is a well-developed, well-nourished female in no acute distress. Patient has normal mood and affect. Patient is alert and oriented to person, place, and time.   HEENT: Atraumatic, normocephalic. Pupils equal and reactive to light. Extraocular motion intact. Noninjected sclera.  Cardiovascular: Regular rate and rhythm, with no murmurs, rubs, or gallops. Distal pulses palpable. No carotid bruits.  Respiratory: Lungs clear to auscultation bilaterally.   Right Knee: Soft tissue swelling: minimal Effusion: none Erythema: none Crepitance: mild Tenderness: medial Alignment: normal Mediolateral laxity: stable Posterior sag: negative Patellar tracking: Good tracking without evidence of subluxation or tilt Atrophy: No significant atrophy.   Quadriceps tone was fair to good. Range of motion: 0/2/120 degrees  Able to dorsiflex and plantarflex right ankle. Able to flex and extend the toes.  Sensation intact over the saphenous, lateral sural cutaneous, superficial fibular, and deep fibular nerve distributions.  Tests Performed/Reviewed:  X-rays  No results found. Previous x-rays of the right knee showed near complete loss of medial compartment joint space with subchondral sclerosis and osteophyte formation.  Impression:   ICD-10-CM  1. Primary osteoarthritis of right knee M17.11   Plan:   The patient has end-stage degenerative changes of the right knee. It was explained to the patient that the condition is progressive in nature. Having failed conservative treatment, the patient has elected to proceed with a total joint arthroplasty. The patient will undergo a total joint arthroplasty with Dr. Marry Guan. The risks of surgery, including blood clot and infection, were discussed with the patient. Measures to reduce these risks, including the use of anticoagulation, perioperative antibiotics, and early ambulation were discussed. The importance of postoperative physical therapy was discussed with the patient. The patient elects to proceed with surgery. The patient is instructed to stop all blood thinners prior to surgery. The patient is instructed to call the hospital the day before surgery to learn of the proper arrival time.   Contact our office with any questions or concerns. Follow up as indicated, or sooner should any new problems arise, if  conditions worsen, or if they are otherwise concerned.   Gwenlyn Fudge, [facility] [place], [place] [address] Phone: [phone]  This note was generated in part with voice recognition software and I apologize for any typographical errors that were not detected and corrected.  Electronically signed by Gwenlyn Fudge,  PA at 06/16/2022 2:01 PM EDT

## 2022-06-22 NOTE — Discharge Summary (Signed)
Physician Discharge Summary  Patient ID: Dawn Vega MRN: [id number] DOB/AGE: [birth year] 81 y.o.  Admit date: 06/22/2022 Discharge date: 06/23/2022  Admission Diagnoses:  Total knee replacement status [Z96.659]  Surgeries:Procedure(s): Right total knee arthroplasty using computer-assisted navigation   SURGEON:  Marciano Sequin. M.D.   ASSISTANT: Cassell Smiles, PA-C (present and scrubbed throughout the case, critical for assistance with exposure, retraction, instrumentation, and closure)   ANESTHESIA: general   ESTIMATED BLOOD LOSS: 50 mL   FLUIDS REPLACED: 1700 mL of crystalloid   TOURNIQUET TIME: 77 minutes   DRAINS: 2 medium Hemovac drains   SOFT TISSUE RELEASES: Anterior cruciate ligament, posterior cruciate ligament, deep medial collateral ligament, patellofemoral ligament   IMPLANTS UTILIZED: DePuy Attune size 4 posterior stabilized femoral component (cemented), size 4 rotating platform tibial component (cemented), 35 mm medialized dome patella (cemented), and an 8 mm stabilized rotating platform polyethylene insert.  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   Palpitations 06/25/2019   TIA (transient ischemic attack) 03/13/2019   Claudication ([place]) 07/25/2018   Lumbar degenerative disc disease 07/25/2018   Hyperglycemia 07/19/2017   Prediabetes 07/19/2017   Essential hypertension 12/15/2015   Gastroesophageal reflux disease without esophagitis 12/15/2015   Primary osteoarthritis involving multiple joints 12/15/2015   Hearing loss 04/30/2014   Tremor 04/30/2014   Chronic cholecystitis 05/22/2013    Past Medical History:  Diagnosis Date   Arthritis    Chronic cholecystitis    Claudication (HCC)    GERD  (gastroesophageal reflux disease)    Hearing loss    History of kidney stones    Hyperglycemia    Hyperlipidemia    Hypertension    Kidney disease, chronic, stage III (moderate, EGFR 30-59 ml/min) (HCC)    Lumbar degenerative disc disease    Palpitations    TIA (transient ischemic attack) 01/2019   Tremor      Transfusion:    Consultants (if any):   Discharged Condition: Improved  Hospital Course: Dawn Vega is an 81 y.o. female who was admitted 06/22/2022 with a diagnosis of right knee osteoarthritis and went to the operating room on 06/22/2022 and underwent right total knee arthroplasty. The patient received perioperative antibiotics for prophylaxis (see below). The patient tolerated the procedure well and was transported to PACU in stable condition. After meeting PACU criteria, the patient was subsequently transferred to the Orthopaedics/Rehabilitation unit.   The patient received DVT prophylaxis in the form of early mobilization, Lovenox, TED hose, and SCDs . A sacral pad had been placed and heels were elevated off of the bed with rolled towels in order to protect skin integrity. Foley catheter was discontinued on postoperative day #0. Wound drains were discontinued on postoperative day #1. The surgical incision was healing well without signs of infection.  Physical therapy was initiated postoperatively for transfers, gait training, and strengthening. Occupational therapy was initiated for activities of daily living and evaluation for assisted devices. Rehabilitation goals were reviewed in detail with the patient. The patient made steady progress with physical therapy and physical therapy recommended discharge to Home.   The patient achieved the preliminary goals of this hospitalization and was felt to be medically and orthopaedically appropriate for discharge.  She was given perioperative antibiotics:  Anti-infectives (From admission, onward)  Start     Dose/Rate Route  Frequency Ordered Stop   06/22/22 2100  ceFAZolin (ANCEF) IVPB 2g/100 mL premix        2 g 200 mL/hr over 30 Minutes Intravenous Every 6 hours 06/22/22 1954 06/23/22 0351   06/22/22 1137  ceFAZolin (ANCEF) 2-4 GM/100ML-% IVPB       Note to Pharmacy: Olena Mater F: cabinet override      06/22/22 1137 06/22/22 1355   06/22/22 1130  ceFAZolin (ANCEF) IVPB 2g/100 mL premix        2 g 200 mL/hr over 30 Minutes Intravenous On call to O.R. 06/22/22 1118 06/22/22 1354     .  Recent vital signs:  Vitals:   06/23/22 0749 06/23/22 1007  BP: (!) 146/79 134/65  Pulse: 84   Resp: 16   Temp: 97.8 F (36.6 C)   SpO2: 95%     Recent laboratory studies:  No results for input(s): "WBC", "HGB", "HCT", "PLT", "K", "CL", "CO2", "BUN", "CREATININE", "GLUCOSE", "CALCIUM", "LABPT", "INR" in the last 72 hours.  Diagnostic Studies: DG Knee Right Port  Result Date: 06/22/2022 CLINICAL DATA:  Right knee arthroplasty EXAM: PORTABLE RIGHT KNEE - 1-2 VIEW COMPARISON:  None Available. FINDINGS: There is evidence of recent right knee arthroplasty. No fracture is seen. Surgical drains are noted anterior to the distal femur. Skin staples are noted anteriorly. There are linear lucencies in distal shaft of femur and proximal shaft of tibia from previous immobilization. IMPRESSION: Status post right knee arthroplasty. Electronically Signed   By: Elmer Picker M.D.   On: 06/22/2022 17:38    Discharge Medications:   Allergies as of 06/23/2022       Reactions   Potassium-containing Compounds    Pt does not remember reaction or intolerance    Ace Inhibitors Cough   Amlodipine Other (See Comments)   Constipation, edema   Calcium Other (See Comments)   Intolerance, constipation   Crestor [rosuvastatin Calcium] Other (See Comments)   Muscle pain   Hydrochlorothiazide Other (See Comments)   Bilateral hand tingling   Lipitor [atorvastatin Calcium] Other (See Comments)   Muscle pain.   Pravastatin Other (See  Comments)   Muscle pain   Tape Rash   Silicone tape        Medication List     STOP taking these medications    aspirin EC 81 MG tablet       TAKE these medications    acetaminophen 500 MG tablet Commonly known as: TYLENOL Take 500-1,000 mg by mouth every 6 (six) hours as needed (for pain.).   B-complex with vitamin C tablet Take 1 tablet by mouth daily.   celecoxib 200 MG capsule Commonly known as: CELEBREX Take 1 capsule (200 mg total) by mouth 2 (two) times daily.   Cholecalciferol 25 MCG (1000 UT) tablet Take 1,000 Units by mouth 2 (two) times daily.   enoxaparin 40 MG/0.4ML injection Commonly known as: LOVENOX Inject 0.4 mLs (40 mg total) into the skin daily for 14 days.   EQ Chlortabs 4 MG tablet Generic drug: chlorpheniramine Take 4 mg by mouth 2 (two) times daily as needed for allergies.   fish oil-omega-3 fatty acids 1000 MG capsule Take 1,000 mg by mouth in the morning and at bedtime.   losartan 100 MG tablet Commonly known as: COZAAR Take 100 mg by mouth every morning.   melatonin 5 MG Tabs Take 5 mg by mouth at bedtime.   metoprolol succinate 100 MG 24 hr tablet  Commonly known as: TOPROL-XL Take 100 mg by mouth every morning.   omeprazole 20 MG capsule Commonly known as: PRILOSEC Take 20 mg by mouth every morning.   oxyCODONE 5 MG immediate release tablet Commonly known as: Oxy IR/ROXICODONE Take 1 tablet (5 mg total) by mouth every 4 (four) hours as needed for severe pain.   Prevagen 10 MG Caps Generic drug: Apoaequorin Take 1 tablet by mouth daily at 6 (six) AM.   traMADol 50 MG tablet Commonly known as: ULTRAM Take 1 tablet (50 mg total) by mouth every 4 (four) hours as needed for moderate pain.   vitamin E 180 MG (400 UNITS) capsule Take 400 Units by mouth daily.               Durable Medical Equipment  (From admission, onward)           Start     Ordered   06/22/22 1955  DME Walker rolling  Once        Question:  Patient needs a walker to treat with the following condition  Answer:  Total knee replacement status   06/22/22 1954   06/22/22 1955  DME Bedside commode  Once       Question:  Patient needs a bedside commode to treat with the following condition  Answer:  Total knee replacement status   06/22/22 1954            Disposition: Home with home health PT     Follow-up Information     Fausto Skillern, PA-C Follow up on 07/07/2022.   Specialty: Orthopedic Surgery Why: at 1:15pm Contact information: [place] [address] [phone]         Dereck Leep, MD Follow up on 08/04/2022.   Specialty: Orthopedic Surgery Why: at 2:63mContact information: [id number] PA-C 06/23/2022, 11:43 AM

## 2022-06-22 NOTE — Anesthesia Procedure Notes (Signed)
Procedure Name: Intubation Date/Time: 06/22/2022 2:16 PM  Performed by: Nelda Marseille, CRNAPre-anesthesia Checklist: Patient identified, Patient being monitored, Timeout performed, Emergency Drugs available and Suction available Patient Re-evaluated:Patient Re-evaluated prior to induction Oxygen Delivery Method: Circle System Utilized Preoxygenation: Pre-oxygenation with 100% oxygen Induction Type: IV induction Ventilation: Mask ventilation without difficulty Laryngoscope Size: Mac, 3 and McGraph Grade View: Grade II Tube type: Oral Tube size: 7.0 mm Number of attempts: 1 Airway Equipment and Method: Stylet, Oral airway and Video-laryngoscopy Placement Confirmation: ETT inserted through vocal cords under direct vision, positive ETCO2 and breath sounds checked- equal and bilateral Secured at: 21 cm Tube secured with: Tape Dental Injury: Teeth and Oropharynx as per pre-operative assessment  Difficulty Due To: Difficulty was unanticipated

## 2022-06-22 NOTE — Op Note (Signed)
OPERATIVE NOTE  DATE OF SURGERY:  06/22/2022  PATIENT NAME:  Dawn Vega   DOB: 12-01-1940  MRN: [id number]  PRE-OPERATIVE DIAGNOSIS: Degenerative arthrosis of the right knee, primary  POST-OPERATIVE DIAGNOSIS:  Same  PROCEDURE:  Right total knee arthroplasty using computer-assisted navigation  SURGEON:  Marciano Sequin. M.D.  ASSISTANT: Cassell Smiles, PA-C (present and scrubbed throughout the case, critical for assistance with exposure, retraction, instrumentation, and closure)  ANESTHESIA: general  ESTIMATED BLOOD LOSS: 50 mL  FLUIDS REPLACED: 1700 mL of crystalloid  TOURNIQUET TIME: 77 minutes  DRAINS: 2 medium Hemovac drains  SOFT TISSUE RELEASES: Anterior cruciate ligament, posterior cruciate ligament, deep medial collateral ligament, patellofemoral ligament  IMPLANTS UTILIZED: DePuy Attune size 4 posterior stabilized femoral component (cemented), size 4 rotating platform tibial component (cemented), 35 mm medialized dome patella (cemented), and an 8 mm stabilized rotating platform polyethylene insert.  INDICATIONS FOR SURGERY: Dawn Vega is a 81 y.o. year old female with a long history of progressive knee pain. X-rays demonstrated severe degenerative changes in tricompartmental fashion. The patient had not seen any significant improvement despite conservative nonsurgical intervention. After discussion of the risks and benefits of surgical intervention, the patient expressed understanding of the risks benefits and agree with plans for total knee arthroplasty.   The risks, benefits, and alternatives were discussed at length including but not limited to the risks of infection, bleeding, nerve injury, stiffness, blood clots, the need for revision surgery, cardiopulmonary complications, among others, and they were willing to proceed.  PROCEDURE IN DETAIL: The patient was brought into the operating room and, after adequate general anesthesia was achieved, a tourniquet was  placed on the patient's upper thigh. The patient's knee and leg were cleaned and prepped with alcohol and DuraPrep and draped in the usual sterile fashion. A "timeout" was performed as per usual protocol. The lower extremity was exsanguinated using an Esmarch, and the tourniquet was inflated to 300 mmHg. An anterior longitudinal incision was made followed by a standard mid vastus approach. The deep fibers of the medial collateral ligament were elevated in a subperiosteal fashion off of the medial flare of the tibia so as to maintain a continuous soft tissue sleeve. The patella was subluxed laterally and the patellofemoral ligament was incised. Inspection of the knee demonstrated severe degenerative changes with full-thickness loss of articular cartilage. Osteophytes were debrided using a rongeur. Anterior and posterior cruciate ligaments were excised. Two 4.0 mm Schanz pins were inserted in the femur and into the tibia for attachment of the array of trackers used for computer-assisted navigation. Hip center was identified using a circumduction technique. Distal landmarks were mapped using the computer. The distal femur and proximal tibia were mapped using the computer. The distal femoral cutting guide was positioned using computer-assisted navigation so as to achieve a 5 distal valgus cut. The femur was sized and it was felt that a size 4 femoral component was appropriate. A size 4 femoral cutting guide was positioned and the anterior cut was performed and verified using the computer. This was followed by completion of the posterior and chamfer cuts. Femoral cutting guide for the central box was then positioned in the center box cut was performed.  Attention was then directed to the proximal tibia. Medial and lateral menisci were excised. The extramedullary tibial cutting guide was positioned using computer-assisted navigation so as to achieve a 0 varus-valgus alignment and 3 posterior slope. The cut was  performed and verified using the computer. The proximal tibia was  sized and it was felt that a size 4 tibial tray was appropriate. Tibial and femoral trials were inserted followed by insertion of an 8 mm polyethylene insert. This allowed for excellent mediolateral soft tissue balancing both in flexion and in full extension. Finally, the patella was cut and prepared so as to accommodate a 35 mm medialized dome patella. A patella trial was placed and the knee was placed through a range of motion with excellent patellar tracking appreciated. The femoral trial was removed after debridement of posterior osteophytes. The central post-hole for the tibial component was reamed followed by insertion of a keel punch. Tibial trials were then removed. Cut surfaces of bone were irrigated with copious amounts of normal saline using pulsatile lavage and then suctioned dry. Polymethylmethacrylate cement was prepared in the usual fashion using a vacuum mixer. Cement was applied to the cut surface of the proximal tibia as well as along the undersurface of a size 4 rotating platform tibial component. Tibial component was positioned and impacted into place. Excess cement was removed using [employer] fast streamer. Cement was then applied to the cut surfaces of the femur as well as along the posterior flanges of the size 4 femoral component. The femoral component was positioned and impacted into place. Excess cement was removed using [employer] fast streamer. A n 8 mm polyethylene trial was inserted and the knee was brought into full extension with steady axial compression applied. Finally, cement was applied to the backside of a 35 mm medialized dome patella and the patellar component was positioned and patellar clamp applied. Excess cement was removed using [employer] fast streamer. After adequate curing of the cement, the tourniquet was deflated after a total tourniquet time of 77 minutes. Hemostasis was achieved using electrocautery. The knee was irrigated  with copious amounts of normal saline using pulsatile lavage followed by 450 ml of Surgiphor and then suctioned dry. 20 mL of 1.3% Exparel and 60 mL of 0.25% Marcaine in 40 mL of normal saline was injected along the posterior capsule, medial and lateral gutters, and along the arthrotomy site. An 8 mm stabilized rotating platform polyethylene insert was inserted and the knee was placed through a range of motion with excellent mediolateral soft tissue balancing appreciated and excellent patellar tracking noted. 2 medium drains were placed in the wound bed and brought out through separate stab incisions. The medial parapatellar portion of the incision was reapproximated using interrupted sutures of #1 Vicryl. Subcutaneous tissue was approximated in layers using first #0 Vicryl followed #2-0 Vicryl. The skin was approximated with skin staples. A sterile dressing was applied.  The patient tolerated the procedure well and was transported to the recovery room in stable condition.    Ludene Stokke P. Holley Bouche., M.D.

## 2022-06-22 NOTE — Anesthesia Preprocedure Evaluation (Signed)
Anesthesia Evaluation  Patient identified by MRN, date of birth, ID band Patient awake    Reviewed: Allergy & Precautions, H&P , NPO status , Patient's Chart, lab work & pertinent test results, reviewed documented beta blocker date and time   History of Anesthesia Complications Negative for: history of anesthetic complications  Airway Mallampati: III   Neck ROM: full    Dental  (+) Poor Dentition, Dental Advidsory Given   Pulmonary neg pulmonary ROS, neg shortness of breath, neg sleep apnea, neg recent URI,    Pulmonary exam normal        Cardiovascular Exercise Tolerance: Poor hypertension, On Medications + Peripheral Vascular Disease  (-) Past MI and (-) CABG Normal cardiovascular exam Rhythm:regular Rate:Normal     Neuro/Psych TIAnegative psych ROS   GI/Hepatic Neg liver ROS, GERD  Medicated,  Endo/Other  negative endocrine ROS  Renal/GU Renal diseasestones  negative genitourinary   Musculoskeletal  (+) Arthritis , Osteoarthritis,    Abdominal   Peds negative pediatric ROS (+)  Hematology negative hematology ROS (+)   Anesthesia Other Findings Past Medical History: No date: GERD (gastroesophageal reflux disease) No date: History of kidney stones No date: Hyperlipidemia No date: Hypertension 2014: Kidney stones Past Surgical History: 1963: APPENDECTOMY 2010: COLONOSCOPY     Comment:  Dr. Candace Cruise No date: EXTERNAL EAR SURGERY; Left     Comment:  1970's 1970: FOOT SURGERY; Left 1963: OVARY SURGERY     Comment:  due to cyst rupture No date: WISDOM TOOTH EXTRACTION BMI    Body Mass Index:  33.84 kg/m     Reproductive/Obstetrics negative OB ROS                             Anesthesia Physical  Anesthesia Plan  ASA: III  Anesthesia Plan: General/Spinal   Post-op Pain Management:    Induction:   PONV Risk Score and Plan: 4 or greater  Airway Management Planned: Nasal  Cannula  Additional Equipment:   Intra-op Plan:   Post-operative Plan:   Informed Consent: I have reviewed the patients History and Physical, chart, labs and discussed the procedure including the risks, benefits and alternatives for the proposed anesthesia with the patient or authorized representative who has indicated his/her understanding and acceptance.     Dental Advisory Given  Plan Discussed with: CRNA  Anesthesia Plan Comments: (Patient reports no bleeding problems and no anticoagulant use.  Plan for spinal with backup GA  Patient consented for risks of anesthesia including but not limited to:  - adverse reactions to medications - damage to eyes, teeth, lips or other oral mucosa - nerve damage due to positioning  - risk of bleeding, infection and or nerve damage from spinal that could lead to paralysis - risk of headache or failed spinal - damage to teeth, lips or other oral mucosa - sore throat or hoarseness - damage to heart, brain, nerves, lungs, other parts of body or loss of life  Patient voiced understanding.)        Anesthesia Quick Evaluation

## 2022-06-23 ENCOUNTER — Other Ambulatory Visit: Payer: Self-pay

## 2022-06-23 ENCOUNTER — Encounter: Payer: Self-pay | Admitting: Orthopedic Surgery

## 2022-06-23 DIAGNOSIS — Z91040 Latex allergy status: Secondary | ICD-10-CM | POA: Diagnosis not present

## 2022-06-23 DIAGNOSIS — Z96642 Presence of left artificial hip joint: Secondary | ICD-10-CM | POA: Diagnosis not present

## 2022-06-23 DIAGNOSIS — M1711 Unilateral primary osteoarthritis, right knee: Secondary | ICD-10-CM | POA: Diagnosis not present

## 2022-06-23 DIAGNOSIS — Z79899 Other long term (current) drug therapy: Secondary | ICD-10-CM | POA: Diagnosis not present

## 2022-06-23 DIAGNOSIS — I129 Hypertensive chronic kidney disease with stage 1 through stage 4 chronic kidney disease, or unspecified chronic kidney disease: Secondary | ICD-10-CM | POA: Diagnosis not present

## 2022-06-23 DIAGNOSIS — Z7982 Long term (current) use of aspirin: Secondary | ICD-10-CM | POA: Diagnosis not present

## 2022-06-23 DIAGNOSIS — N1831 Chronic kidney disease, stage 3a: Secondary | ICD-10-CM | POA: Diagnosis not present

## 2022-06-23 DIAGNOSIS — Z8673 Personal history of transient ischemic attack (TIA), and cerebral infarction without residual deficits: Secondary | ICD-10-CM | POA: Diagnosis not present

## 2022-06-23 MED ORDER — ENOXAPARIN SODIUM 40 MG/0.4ML IJ SOSY: 40.0000 mg | PREFILLED_SYRINGE | INTRAMUSCULAR | 0 refills | Status: AC

## 2022-06-23 MED ORDER — TRAMADOL HCL 50 MG PO TABS
50.0000 mg | ORAL_TABLET | ORAL | 0 refills | Status: AC | PRN
Start: 2022-06-23 — End: ?

## 2022-06-23 MED ORDER — CELECOXIB 200 MG PO CAPS: 200.0000 mg | ORAL_CAPSULE | Freq: Two times a day (BID) | ORAL | 0 refills | Status: AC

## 2022-06-23 MED ORDER — OXYCODONE HCL 5 MG PO TABS
5.0000 mg | ORAL_TABLET | ORAL | 0 refills | Status: AC | PRN
Start: 2022-06-23 — End: ?

## 2022-06-23 NOTE — TOC Progression Note (Signed)
Transition of Care [facility]) - Progression Note    Patient Details  Name: Dawn Vega MRN: [id number] Date of Birth: Aug 26, 1941  Transition of [facility]) CM/SW [place], RN Phone Number: 06/23/2022, 10:06 AM  Clinical Narrative:     The patient is set up with [facility] services and has DME at home    Her spouse Hollice Espy will provide transportation She can afford her medication    Expected Discharge Plan and Services                                                 Social Determinants of Health (SDOH) Interventions    Readmission Risk Interventions     No data to display

## 2022-06-23 NOTE — Progress Notes (Signed)
  Subjective: 1 Day Post-Op Procedure(s) (LRB): COMPUTER ASSISTED TOTAL KNEE ARTHROPLASTY (Right) Son at bedside.  Patient reports pain as well-controlled.   Patient is well, and has had no acute complaints or problems Plan is to go Home after hospital stay. Negative for chest pain and shortness of breath Fever: no Gastrointestinal: negative for nausea and vomiting.   Patient has not had a bowel movement.  Objective: Vital signs in last 24 hours: Temp:  [97 F (36.1 C)-98.9 F (37.2 C)] 97.8 F (36.6 C) (09/07 0749) Pulse Rate:  [53-86] 84 (09/07 0749) Resp:  [9-18] 16 (09/07 0749) BP: (69-188)/(41-96) 146/79 (09/07 0749) SpO2:  [95 %-100 %] 95 % (09/07 0749) Weight:  [79.8 kg] 79.8 kg (09/06 1131)  Intake/Output from previous day:  Intake/Output Summary (Last 24 hours) at 06/23/2022 0906 Last data filed at 06/23/2022 0500 Gross per 24 hour  Intake 2052.18 ml  Output 510 ml  Net 1542.18 ml    Intake/Output this shift: No intake/output data recorded.  Labs: No results for input(s): "HGB" in the last 72 hours. No results for input(s): "WBC", "RBC", "HCT", "PLT" in the last 72 hours. No results for input(s): "NA", "K", "CL", "CO2", "BUN", "CREATININE", "GLUCOSE", "CALCIUM" in the last 72 hours. No results for input(s): "LABPT", "INR" in the last 72 hours.   EXAM General - Patient is Alert, Appropriate, and Oriented Extremity - Neurovascular intact Dorsiflexion/Plantar flexion intact Compartment soft Dressing/Incision -Postoperative dressing remains in place., Polar Care in place and working. , Hemovac in place. , Following removal of post-op dressing, minimal bloody drainage noted Motor Function - intact, moving foot and toes well on exam. Able to perform independent SLR.  Cardiovascular- Regular rate and rhythm, no murmurs/rubs/gallops Respiratory- Lungs clear to auscultation bilaterally Gastrointestinal- soft, nontender, and active bowel sounds   Assessment/Plan: 1  Day Post-Op Procedure(s) (LRB): COMPUTER ASSISTED TOTAL KNEE ARTHROPLASTY (Right) Principal Problem:   Total knee replacement status  Estimated body mass index is 32.19 kg/m as calculated from the following:   Height as of this encounter: '5\' 2"'$  (1.575 m).   Weight as of this encounter: 79.8 kg. Advance diet Up with therapy   Post-op dressing removed. , Hemovac removed., and Mini compression dressing applied.   DVT Prophylaxis - Lovenox, Ted hose, and SCDs Weight-Bearing as tolerated to right leg  Cassell Smiles, PA-C [facility] Orthopaedic Surgery 06/23/2022, 9:06 AM

## 2022-06-23 NOTE — Progress Notes (Signed)
Discharge instructions reviewed with patient including followup visits and new medications.  Understanding was verbalized and all questions were answered.  IV removed without complication; patient tolerated well.  Patient discharged home via wheelchair in stable condition escorted by nursing staff.  

## 2022-06-23 NOTE — Evaluation (Signed)
Occupational Therapy Evaluation Patient Details Name: Dawn Vega MRN: [id number] DOB: April 03, 1941 Today's Date: 06/23/2022   History of Present Illness Pt is an 81 year old female s/p right total knee arthroplasty on 06/22/22   Clinical Impression   Pt seen for OT evaluation this date, POD#1 from above surgery. Pt was MOD I-I in all ADLs prior to surgery, however occasionally using SPC for community distances. Pt is eager to return to PLOF with less pain and improved safety and independence. Pt and son provided education via hand out and demonstration re:  polar care mgt, falls prevention strategies, home/routines modifications, DME/AE for LB bathing and dressing tasks, and compression stocking mgt. STS completed with supervision, amb to bathroom with RW completed with supervision, toileting with supervision, grooming tasks in standing at sink level with supervision. Intermittent vcs provided throughout for safety and technique. Pt would benefit from skilled OT services including additional instruction in dressing techniques with or without assistive devices for dressing and bathing skills to support recall and carryover prior to discharge and ultimately to maximize safety, independence, and minimize falls risk and caregiver burden.HHOT recommended following discharge.    Recommendations for follow up therapy are one component of a multi-disciplinary discharge planning process, led by the attending physician.  Recommendations may be updated based on patient status, additional functional criteria and insurance authorization.   Follow Up Recommendations  Home health OT    Assistance Recommended at Discharge Intermittent Supervision/Assistance  Patient can return home with the following A little help with walking and/or transfers;A little help with bathing/dressing/bathroom    Functional Status Assessment  Patient has had a recent decline in their functional status and demonstrates the ability  to make significant improvements in function in a reasonable and predictable amount of time.  Equipment Recommendations  None recommended by OT (pt has recommended equipment)    Recommendations for Other Services       Precautions / Restrictions Precautions Precautions: Knee Restrictions Weight Bearing Restrictions: Yes RLE Weight Bearing: Weight bearing as tolerated      Mobility Bed Mobility               General bed mobility comments: NT pt in recliner pre/post session    Transfers Overall transfer level: Needs assistance Equipment used: Rolling walker (2 wheels) Transfers: Sit to/from Stand Sit to Stand: Supervision                  Balance Overall balance assessment: Needs assistance Sitting-balance support: Feet supported Sitting balance-Leahy Scale: Good     Standing balance support: Bilateral upper extremity supported, During functional activity Standing balance-Leahy Scale: Fair                             ADL either performed or assessed with clinical judgement   ADL Overall ADL's : Needs assistance/impaired     Grooming: Wash/dry hands;Standing;Supervision/safety Grooming Details (indicate cue type and reason): sink level with RW             Lower Body Dressing: Supervision/safety;Sitting/lateral leans Lower Body Dressing Details (indicate cue type and reason): R sock Toilet Transfer: Supervision/safety;Rolling walker (2 wheels);BSC/3in1;Ambulation Toilet Transfer Details (indicate cue type and reason): intermittent vcs for technique Toileting- Clothing Manipulation and Hygiene: Supervision/safety;Sit to/from stand Toileting - Clothing Manipulation Details (indicate cue type and reason): peri care     Functional mobility during ADLs: Supervision/safety;Rolling walker (2 wheels);Cueing for safety  Vision Patient Visual Report: No change from baseline       Perception     Praxis      Pertinent Vitals/Pain  Pain Assessment Pain Assessment: 0-10 Pain Score: 1  Pain Location: R knee Pain Descriptors / Indicators: Burning Pain Intervention(s): Limited activity within patient's tolerance, Monitored during session, Repositioned, Ice applied     Hand Dominance Right   Extremity/Trunk Assessment Upper Extremity Assessment Upper Extremity Assessment: Overall WFL for tasks assessed (pt with tremor throughout BUE- reports this is baseline, RN reports she has resumed home meds)   Lower Extremity Assessment Lower Extremity Assessment: RLE deficits/detail RLE Deficits / Details: s/p R TKA       Communication Communication Communication: No difficulties   Cognition Arousal/Alertness: Awake/alert Behavior During Therapy: WFL for tasks assessed/performed Overall Cognitive Status: Within Functional Limits for tasks assessed                                       General Comments       Exercises     Shoulder Instructions      Home Living Family/patient expects to be discharged to:: Private residence Living Arrangements: Spouse/significant other Available Help at Discharge: Family;Friend(s);Personal care attendant;Available 24 hours/day (first weeks will have help 24/7) Type of Home: House Home Access: Stairs to enter [employer] of Steps: through garage- 2 seteps with grab bar to hold on   Home Layout: One level     Bathroom Shower/Tub: Occupational psychologist: Handicapped height Bathroom Accessibility: Yes   Home Equipment: [place] - single point;Grab bars - tub/shower;BSC/3in1;Rolling Walker (2 wheels);Shower seat;Adaptive equipment Adaptive Equipment: Reacher;Sock aid;Long-handled shoe horn        Prior Functioning/Environment               Mobility Comments: amb no AD household distances, SPC for longer community distances ADLs Comments: MOD I in ADL/IADL, cooking, cleaning, driving        OT Problem List: Decreased activity  tolerance;Decreased knowledge of use of DME or AE;Impaired balance (sitting and/or standing)      OT Treatment/Interventions: Self-care/ADL training;Patient/family education;Therapeutic exercise;Balance training;Therapeutic activities;DME and/or AE instruction    OT Goals(Current goals can be found in the care plan section) Acute Rehab OT Goals Patient Stated Goal: to go home OT Goal Formulation: With patient/family Time For Goal Achievement: 07/07/22 Potential to Achieve Goals: Good ADL Goals Pt Will Perform Grooming: with modified independence;standing;sitting Pt Will Perform Lower Body Dressing: with modified independence Pt Will Transfer to Toilet: with modified independence;ambulating Pt Will Perform Toileting - Clothing Manipulation and hygiene: with modified independence;sit to/from stand  OT Frequency: Min 2X/week    Co-evaluation              AM-PAC OT "6 Clicks" Daily Activity     Outcome Measure Help from another person eating meals?: None Help from another person taking care of personal grooming?: None Help from another person toileting, which includes using toliet, bedpan, or urinal?: None Help from another person bathing (including washing, rinsing, drying)?: A Little Help from another person to put on and taking off regular upper body clothing?: None Help from another person to put on and taking off regular lower body clothing?: A Little 6 Click Score: 22   End of Session Equipment Utilized During Treatment: Rolling walker (2 wheels) Nurse Communication: Mobility status  Activity Tolerance: Patient tolerated treatment well Patient  left: in chair;with call bell/phone within reach;with family/visitor present  OT Visit Diagnosis: Unsteadiness on feet (R26.81);Other abnormalities of gait and mobility (R26.89)                Time: 2174-7159 OT Time Calculation (min): 33 min Charges:  OT General Charges $OT Visit: 1 Visit OT Evaluation $OT Eval Low Complexity:  1 Low  Shanon Payor, OTD OTR/L  06/23/22, 10:14 AM

## 2022-06-23 NOTE — Plan of Care (Signed)
  Problem: Activity: Goal: Ability to avoid complications of mobility impairment will improve Outcome: Progressing   Problem: Clinical Measurements: Goal: Postoperative complications will be avoided or minimized Outcome: Progressing   Problem: Pain Management: Goal: Pain level will decrease with appropriate interventions Outcome: Progressing   Problem: Education: Goal: Knowledge of General Education information will improve Description: Including pain rating scale, medication(s)/side effects and non-pharmacologic comfort measures Outcome: Progressing   Problem: Clinical Measurements: Goal: Ability to maintain clinical measurements within normal limits will improve Outcome: Progressing Goal: Will remain free from infection Outcome: Progressing Goal: Diagnostic test results will improve Outcome: Progressing Goal: Respiratory complications will improve Outcome: Progressing Goal: Cardiovascular complication will be avoided Outcome: Progressing   Problem: Activity: Goal: Risk for activity intolerance will decrease Outcome: Progressing   Problem: Nutrition: Goal: Adequate nutrition will be maintained Outcome: Progressing   Problem: Coping: Goal: Level of anxiety will decrease Outcome: Progressing   Problem: Elimination: Goal: Will not experience complications related to bowel motility Outcome: Progressing Goal: Will not experience complications related to urinary retention Outcome: Progressing   Problem: Pain Managment: Goal: General experience of comfort will improve Outcome: Progressing   Problem: Safety: Goal: Ability to remain free from injury will improve Outcome: Progressing

## 2022-06-23 NOTE — Anesthesia Postprocedure Evaluation (Signed)
Anesthesia Post Note  Patient: Dawn Vega  Procedure(s) Performed: COMPUTER ASSISTED TOTAL KNEE ARTHROPLASTY (Right: Knee)  Patient location during evaluation: PACU Anesthesia Type: Combined General/Spinal Level of consciousness: awake and alert Pain management: pain level controlled Vital Signs Assessment: post-procedure vital signs reviewed and stable Respiratory status: spontaneous breathing, nonlabored ventilation, respiratory function stable and patient connected to nasal cannula oxygen Cardiovascular status: blood pressure returned to baseline and stable Postop Assessment: no apparent nausea or vomiting Anesthetic complications: yes   Encounter Notable Events  Notable Event Outcome Phase Comment  Difficult to intubate - unexpected  Intraprocedure Filed from anesthesia note documentation.     Last Vitals:  Vitals:   06/23/22 0324 06/23/22 0749  BP: 134/73 (!) 146/79  Pulse: 86 84  Resp: 17 16  Temp: 37.2 C 36.6 C  SpO2: 97% 95%    Last Pain:  Vitals:   06/22/22 2000  TempSrc:   PainSc: 0-No pain                 Dimas Millin

## 2022-06-23 NOTE — Evaluation (Signed)
Physical Therapy Evaluation Patient Details Name: Dawn Vega MRN: [id number] DOB: Mar 01, 1941 Today's Date: 06/23/2022  History of Present Illness  Pt is an 81 year old female s/p right total knee arthroplasty on 06/22/22  Clinical Impression  Pt is a pleasant 81 year old female who was admitted for R TKR. Pt performs transfers with supervision and ambulation with cga and RW. Pt demonstrates deficits with strength/pain/mobility. Pt demonstrates ability to perform 10 SLRs with independence, therefore does not require KI for mobility. Safe technique with gait and stair training. Pt demonstrates all bed mobility/transfers/ambulation sufficient for home discharge today. Pt is set up for HHPT and has all equipment. Will dc current orders. Secure chat to team.      Recommendations for follow up therapy are one component of a multi-disciplinary discharge planning process, led by the attending physician.  Recommendations may be updated based on patient status, additional functional criteria and insurance authorization.  Follow Up Recommendations Home health PT      Assistance Recommended at Discharge Set up Supervision/Assistance  Patient can return home with the following  A little help with walking and/or transfers;Assist for transportation;Help with stairs or ramp for entrance    Equipment Recommendations None recommended by PT  Recommendations for Other Services       Functional Status Assessment Patient has had a recent decline in their functional status and demonstrates the ability to make significant improvements in function in a reasonable and predictable amount of time.     Precautions / Restrictions Precautions Precautions: Knee Precaution Booklet Issued: Yes (comment) Restrictions Weight Bearing Restrictions: Yes RLE Weight Bearing: Weight bearing as tolerated      Mobility  Bed Mobility               General bed mobility comments: NT, up in recliner     Transfers Overall transfer level: Needs assistance Equipment used: Rolling walker (2 wheels) Transfers: Sit to/from Stand Sit to Stand: Supervision           General transfer comment: 1 cue for pushing from seated surface. Once standing, upright posture    Ambulation/Gait Ambulation/Gait assistance: Min guard Gait Distance (Feet): 250 Feet Assistive device: Rolling walker (2 wheels) Gait Pattern/deviations: Step-through pattern       General Gait Details: ambulated with safe technique and reciprocal gait pattern. Slight increase in pain with mobility  Stairs Stairs: Yes Stairs assistance: Min guard Stair Management: One rail Right, Step to pattern Number of Stairs: 4 General stair comments: demonstrated prior to attempt. Safe technique with step to gait pattern.  Wheelchair Mobility    Modified Rankin (Stroke Patients Only)       Balance Overall balance assessment: Needs assistance Sitting-balance support: Feet supported Sitting balance-Leahy Scale: Good     Standing balance support: Bilateral upper extremity supported, During functional activity Standing balance-Leahy Scale: Fair                               Pertinent Vitals/Pain Pain Assessment Pain Assessment: 0-10 Pain Score: 4  Pain Location: R knee Pain Descriptors / Indicators: Burning Pain Intervention(s): Limited activity within patient's tolerance, Repositioned, Ice applied, Premedicated before session    Home Living Family/patient expects to be discharged to:: Private residence Living Arrangements: Spouse/significant other Available Help at Discharge: Family;Friend(s);Personal care attendant (24/7 x 1 week) Type of Home: House Home Access: Stairs to enter Entrance Stairs-Rails: Right Entrance Stairs-Number of Steps: through garage- 2 seteps  with grab bar to hold on   Home Layout: One level Home Equipment: [place] - single point;Grab bars - tub/shower;BSC/3in1;Rolling Walker (2  wheels);Shower seat;Adaptive equipment      Prior Function Prior Level of Function : Independent/Modified Independent;History of Falls (last six months)             Mobility Comments: amb no AD household distances, SPC for longer community distances ADLs Comments: MOD I in ADL/IADL, cooking, cleaning, driving     Hand Dominance   Dominant Hand: Right    Extremity/Trunk Assessment   Upper Extremity Assessment Upper Extremity Assessment: Overall WFL for tasks assessed (baseline tremor)    Lower Extremity Assessment Lower Extremity Assessment: Generalized weakness (R LE grossly 3/5; L LE grossly 5/5) RLE Deficits / Details: s/p R TKA       Communication   Communication: No difficulties  Cognition Arousal/Alertness: Awake/alert Behavior During Therapy: WFL for tasks assessed/performed Overall Cognitive Status: Within Functional Limits for tasks assessed                                 General Comments: pleasant and agreeable to therapy        General Comments      Exercises Total Joint Exercises Goniometric ROM: R knee AAROM: 0-90 degrees Other Exercises Other Exercises: seated ther-ex performed on R LE including AP, quad sets, SLRs, hip abd/add, SAQ, and knee flexion. 10 reps and written HEP given and reviewed. Other Exercises: education given regarding polar care system, car transfers, assist for home including fall prevention   Assessment/Plan    PT Assessment Patient needs continued PT services  PT Problem List Decreased strength;Decreased range of motion;Decreased mobility;Pain       PT Treatment Interventions DME instruction;Gait training;Stair training;Therapeutic exercise    PT Goals (Current goals can be found in the Care Plan section)  Acute Rehab PT Goals Patient Stated Goal: to go home PT Goal Formulation: All assessment and education complete, DC therapy Time For Goal Achievement: 06/23/22 Potential to Achieve Goals: Good     Frequency BID     Co-evaluation               AM-PAC PT "6 Clicks" Mobility  Outcome Measure Help needed turning from your back to your side while in a flat bed without using bedrails?: None Help needed moving from lying on your back to sitting on the side of a flat bed without using bedrails?: None Help needed moving to and from a bed to a chair (including a wheelchair)?: A Little Help needed standing up from a chair using your arms (e.g., wheelchair or bedside chair)?: A Little Help needed to walk in hospital room?: A Little Help needed climbing 3-5 steps with a railing? : A Little 6 Click Score: 20    End of Session Equipment Utilized During Treatment: Gait belt Activity Tolerance: Patient tolerated treatment well Patient left: in chair;with family/visitor present Nurse Communication: Mobility status PT Visit Diagnosis: Muscle weakness (generalized) (M62.81);Difficulty in walking, not elsewhere classified (R26.2);Pain Pain - Right/Left: Right Pain - part of body: Knee    Time: 7412-8786 PT Time Calculation (min) (ACUTE ONLY): 39 min   Charges:   PT Evaluation $PT Eval Low Complexity: 1 Low PT Treatments $Gait Training: 8-22 mins $Therapeutic Exercise: 8-22 mins        Greggory Stallion, PT, DPT, GCS [phone]   Radley Barto 06/23/2022, 11:07 AM

## 2022-07-07 DIAGNOSIS — Z96651 Presence of right artificial knee joint: Secondary | ICD-10-CM | POA: Diagnosis not present

## 2022-07-11 DIAGNOSIS — M5136 Other intervertebral disc degeneration, lumbar region: Secondary | ICD-10-CM | POA: Diagnosis not present

## 2022-07-11 DIAGNOSIS — I129 Hypertensive chronic kidney disease with stage 1 through stage 4 chronic kidney disease, or unspecified chronic kidney disease: Secondary | ICD-10-CM | POA: Diagnosis not present

## 2022-07-11 DIAGNOSIS — M150 Primary generalized (osteo)arthritis: Secondary | ICD-10-CM | POA: Diagnosis not present

## 2022-07-11 DIAGNOSIS — H9190 Unspecified hearing loss, unspecified ear: Secondary | ICD-10-CM | POA: Diagnosis not present

## 2022-07-11 DIAGNOSIS — Z471 Aftercare following joint replacement surgery: Secondary | ICD-10-CM | POA: Diagnosis not present

## 2022-07-11 DIAGNOSIS — N1831 Chronic kidney disease, stage 3a: Secondary | ICD-10-CM | POA: Diagnosis not present

## 2022-07-11 DIAGNOSIS — E785 Hyperlipidemia, unspecified: Secondary | ICD-10-CM | POA: Diagnosis not present

## 2022-07-11 DIAGNOSIS — K219 Gastro-esophageal reflux disease without esophagitis: Secondary | ICD-10-CM | POA: Diagnosis not present

## 2022-07-12 DIAGNOSIS — Z96651 Presence of right artificial knee joint: Secondary | ICD-10-CM | POA: Diagnosis not present

## 2022-07-14 DIAGNOSIS — Z96651 Presence of right artificial knee joint: Secondary | ICD-10-CM | POA: Diagnosis not present

## 2022-07-19 DIAGNOSIS — Z96651 Presence of right artificial knee joint: Secondary | ICD-10-CM | POA: Diagnosis not present

## 2022-07-21 DIAGNOSIS — Z96651 Presence of right artificial knee joint: Secondary | ICD-10-CM | POA: Diagnosis not present

## 2022-07-26 DIAGNOSIS — Z96651 Presence of right artificial knee joint: Secondary | ICD-10-CM | POA: Diagnosis not present

## 2022-07-28 DIAGNOSIS — Z96651 Presence of right artificial knee joint: Secondary | ICD-10-CM | POA: Diagnosis not present

## 2022-08-02 DIAGNOSIS — Z96651 Presence of right artificial knee joint: Secondary | ICD-10-CM | POA: Diagnosis not present

## 2022-08-04 DIAGNOSIS — Z96651 Presence of right artificial knee joint: Secondary | ICD-10-CM | POA: Diagnosis not present

## 2022-09-13 DIAGNOSIS — Z0000 Encounter for general adult medical examination without abnormal findings: Secondary | ICD-10-CM | POA: Diagnosis not present

## 2022-09-13 DIAGNOSIS — I739 Peripheral vascular disease, unspecified: Secondary | ICD-10-CM | POA: Diagnosis not present

## 2022-09-13 DIAGNOSIS — Z8673 Personal history of transient ischemic attack (TIA), and cerebral infarction without residual deficits: Secondary | ICD-10-CM | POA: Diagnosis not present

## 2022-09-13 DIAGNOSIS — E7849 Other hyperlipidemia: Secondary | ICD-10-CM | POA: Diagnosis not present

## 2022-09-13 DIAGNOSIS — R7303 Prediabetes: Secondary | ICD-10-CM | POA: Diagnosis not present

## 2022-09-13 DIAGNOSIS — I129 Hypertensive chronic kidney disease with stage 1 through stage 4 chronic kidney disease, or unspecified chronic kidney disease: Secondary | ICD-10-CM | POA: Diagnosis not present

## 2022-09-13 DIAGNOSIS — N183 Chronic kidney disease, stage 3 unspecified: Secondary | ICD-10-CM | POA: Diagnosis not present

## 2022-09-13 DIAGNOSIS — E785 Hyperlipidemia, unspecified: Secondary | ICD-10-CM | POA: Diagnosis not present

## 2022-09-13 DIAGNOSIS — N1831 Chronic kidney disease, stage 3a: Secondary | ICD-10-CM | POA: Diagnosis not present

## 2022-09-13 DIAGNOSIS — Z1331 Encounter for screening for depression: Secondary | ICD-10-CM | POA: Diagnosis not present

## 2022-09-14 ENCOUNTER — Other Ambulatory Visit: Payer: Self-pay | Admitting: Internal Medicine

## 2022-09-14 DIAGNOSIS — Z1231 Encounter for screening mammogram for malignant neoplasm of breast: Secondary | ICD-10-CM

## 2022-10-25 ENCOUNTER — Ambulatory Visit
Admission: RE | Admit: 2022-10-25 | Discharge: 2022-10-25 | Disposition: A | Discharge status: Home / Self Care | Payer: Medicare HMO | Source: Ambulatory Visit | Attending: Internal Medicine | Admitting: Internal Medicine

## 2022-10-25 DIAGNOSIS — Z1231 Encounter for screening mammogram for malignant neoplasm of breast: Secondary | ICD-10-CM | POA: Insufficient documentation

## 2023-03-14 DIAGNOSIS — N1831 Chronic kidney disease, stage 3a: Secondary | ICD-10-CM | POA: Diagnosis not present

## 2023-03-14 DIAGNOSIS — E7849 Other hyperlipidemia: Secondary | ICD-10-CM | POA: Diagnosis not present

## 2023-03-14 DIAGNOSIS — E785 Hyperlipidemia, unspecified: Secondary | ICD-10-CM | POA: Diagnosis not present

## 2023-03-14 DIAGNOSIS — N183 Chronic kidney disease, stage 3 unspecified: Secondary | ICD-10-CM | POA: Diagnosis not present

## 2023-03-14 DIAGNOSIS — K219 Gastro-esophageal reflux disease without esophagitis: Secondary | ICD-10-CM | POA: Diagnosis not present

## 2023-03-14 DIAGNOSIS — I129 Hypertensive chronic kidney disease with stage 1 through stage 4 chronic kidney disease, or unspecified chronic kidney disease: Secondary | ICD-10-CM | POA: Diagnosis not present

## 2023-03-14 DIAGNOSIS — R7303 Prediabetes: Secondary | ICD-10-CM | POA: Diagnosis not present

## 2023-05-25 DIAGNOSIS — H90A21 Sensorineural hearing loss, unilateral, right ear, with restricted hearing on the contralateral side: Secondary | ICD-10-CM | POA: Diagnosis not present

## 2023-06-22 DIAGNOSIS — H524 Presbyopia: Secondary | ICD-10-CM | POA: Diagnosis not present

## 2023-06-28 DIAGNOSIS — M5441 Lumbago with sciatica, right side: Secondary | ICD-10-CM | POA: Diagnosis not present

## 2023-06-28 DIAGNOSIS — M47816 Spondylosis without myelopathy or radiculopathy, lumbar region: Secondary | ICD-10-CM | POA: Diagnosis not present

## 2023-08-04 DIAGNOSIS — N3946 Mixed incontinence: Secondary | ICD-10-CM | POA: Diagnosis not present

## 2023-08-04 DIAGNOSIS — N3281 Overactive bladder: Secondary | ICD-10-CM | POA: Diagnosis not present

## 2023-08-17 DIAGNOSIS — I739 Peripheral vascular disease, unspecified: Secondary | ICD-10-CM | POA: Diagnosis not present

## 2023-08-17 DIAGNOSIS — Z96651 Presence of right artificial knee joint: Secondary | ICD-10-CM | POA: Diagnosis not present

## 2023-08-17 DIAGNOSIS — Z96642 Presence of left artificial hip joint: Secondary | ICD-10-CM | POA: Diagnosis not present

## 2023-08-31 DIAGNOSIS — L538 Other specified erythematous conditions: Secondary | ICD-10-CM | POA: Diagnosis not present

## 2023-08-31 DIAGNOSIS — L570 Actinic keratosis: Secondary | ICD-10-CM | POA: Diagnosis not present

## 2023-08-31 DIAGNOSIS — L2989 Other pruritus: Secondary | ICD-10-CM | POA: Diagnosis not present

## 2023-08-31 DIAGNOSIS — L821 Other seborrheic keratosis: Secondary | ICD-10-CM | POA: Diagnosis not present

## 2023-08-31 DIAGNOSIS — R208 Other disturbances of skin sensation: Secondary | ICD-10-CM | POA: Diagnosis not present

## 2023-08-31 DIAGNOSIS — L820 Inflamed seborrheic keratosis: Secondary | ICD-10-CM | POA: Diagnosis not present

## 2023-09-01 DIAGNOSIS — N3281 Overactive bladder: Secondary | ICD-10-CM | POA: Diagnosis not present

## 2023-09-01 DIAGNOSIS — N3946 Mixed incontinence: Secondary | ICD-10-CM | POA: Diagnosis not present

## 2023-09-20 DIAGNOSIS — I739 Peripheral vascular disease, unspecified: Secondary | ICD-10-CM | POA: Diagnosis not present

## 2023-09-20 DIAGNOSIS — N1831 Chronic kidney disease, stage 3a: Secondary | ICD-10-CM | POA: Diagnosis not present

## 2023-09-20 DIAGNOSIS — I10 Essential (primary) hypertension: Secondary | ICD-10-CM | POA: Diagnosis not present

## 2023-09-20 DIAGNOSIS — Z0000 Encounter for general adult medical examination without abnormal findings: Secondary | ICD-10-CM | POA: Diagnosis not present

## 2023-09-20 DIAGNOSIS — N183 Chronic kidney disease, stage 3 unspecified: Secondary | ICD-10-CM | POA: Diagnosis not present

## 2023-09-20 DIAGNOSIS — K219 Gastro-esophageal reflux disease without esophagitis: Secondary | ICD-10-CM | POA: Diagnosis not present

## 2023-09-20 DIAGNOSIS — Z1331 Encounter for screening for depression: Secondary | ICD-10-CM | POA: Diagnosis not present

## 2023-09-20 DIAGNOSIS — R7303 Prediabetes: Secondary | ICD-10-CM | POA: Diagnosis not present

## 2023-09-20 DIAGNOSIS — E785 Hyperlipidemia, unspecified: Secondary | ICD-10-CM | POA: Diagnosis not present

## 2023-09-20 DIAGNOSIS — E7849 Other hyperlipidemia: Secondary | ICD-10-CM | POA: Diagnosis not present

## 2023-09-20 DIAGNOSIS — M4800 Spinal stenosis, site unspecified: Secondary | ICD-10-CM | POA: Diagnosis not present

## 2023-09-20 DIAGNOSIS — Z23 Encounter for immunization: Secondary | ICD-10-CM | POA: Diagnosis not present

## 2023-09-20 DIAGNOSIS — I129 Hypertensive chronic kidney disease with stage 1 through stage 4 chronic kidney disease, or unspecified chronic kidney disease: Secondary | ICD-10-CM | POA: Diagnosis not present

## 2023-12-13 ENCOUNTER — Other Ambulatory Visit: Payer: Self-pay | Admitting: Internal Medicine

## 2023-12-13 DIAGNOSIS — Z1231 Encounter for screening mammogram for malignant neoplasm of breast: Secondary | ICD-10-CM

## 2023-12-20 ENCOUNTER — Ambulatory Visit
Admission: RE | Admit: 2023-12-20 | Discharge: 2023-12-20 | Disposition: A | Discharge status: Home / Self Care | Payer: Medicare HMO | Source: Ambulatory Visit | Attending: Internal Medicine | Admitting: Internal Medicine

## 2023-12-20 DIAGNOSIS — Z1231 Encounter for screening mammogram for malignant neoplasm of breast: Secondary | ICD-10-CM | POA: Diagnosis present
